# Patient Record
Sex: Female | Born: 1965 | Race: Black or African American | Hispanic: No | Marital: Single | Smoking: Current every day smoker
Health system: Southern US, Community
[De-identification: ages and names within clinical notes are randomized; demographics above are authoritative.]

## PROBLEM LIST (undated history)

## (undated) DIAGNOSIS — Z9889 Other specified postprocedural states: Secondary | ICD-10-CM

## (undated) DIAGNOSIS — L03115 Cellulitis of right lower limb: Secondary | ICD-10-CM

## (undated) DIAGNOSIS — M797 Fibromyalgia: Secondary | ICD-10-CM

## (undated) DIAGNOSIS — I10 Essential (primary) hypertension: Secondary | ICD-10-CM

## (undated) DIAGNOSIS — E11621 Type 2 diabetes mellitus with foot ulcer: Secondary | ICD-10-CM

## (undated) DIAGNOSIS — J45909 Unspecified asthma, uncomplicated: Secondary | ICD-10-CM

## (undated) DIAGNOSIS — E785 Hyperlipidemia, unspecified: Secondary | ICD-10-CM

## (undated) DIAGNOSIS — E119 Type 2 diabetes mellitus without complications: Secondary | ICD-10-CM

## (undated) DIAGNOSIS — B029 Zoster without complications: Secondary | ICD-10-CM

## (undated) HISTORY — PX: OTHER SURGICAL HISTORY: SHX169

## (undated) HISTORY — DX: Cellulitis of right lower limb: L03.115

## (undated) HISTORY — DX: Type 2 diabetes mellitus with foot ulcer: E11.621

---

## 2006-06-21 ENCOUNTER — Other Ambulatory Visit: Payer: Self-pay

## 2006-06-21 ENCOUNTER — Observation Stay: Payer: Self-pay | Admitting: Internal Medicine

## 2006-06-21 ENCOUNTER — Emergency Department: Payer: Self-pay | Admitting: Emergency Medicine

## 2006-09-07 ENCOUNTER — Emergency Department: Payer: Self-pay | Admitting: Emergency Medicine

## 2007-08-06 ENCOUNTER — Ambulatory Visit: Payer: Self-pay | Admitting: Emergency Medicine

## 2007-09-10 ENCOUNTER — Emergency Department: Payer: Self-pay | Admitting: Emergency Medicine

## 2007-10-22 ENCOUNTER — Other Ambulatory Visit: Payer: Self-pay

## 2007-10-22 ENCOUNTER — Ambulatory Visit: Payer: Self-pay | Admitting: Internal Medicine

## 2007-10-22 ENCOUNTER — Observation Stay: Payer: Self-pay | Admitting: Internal Medicine

## 2009-03-27 ENCOUNTER — Emergency Department: Payer: Self-pay | Admitting: Internal Medicine

## 2009-09-22 ENCOUNTER — Emergency Department: Payer: Self-pay | Admitting: Emergency Medicine

## 2010-05-20 ENCOUNTER — Emergency Department: Payer: Self-pay | Admitting: Emergency Medicine

## 2010-09-07 ENCOUNTER — Ambulatory Visit: Payer: Self-pay | Admitting: Internal Medicine

## 2014-07-08 ENCOUNTER — Emergency Department: Payer: Self-pay | Admitting: Emergency Medicine

## 2014-07-08 LAB — RAPID INFLUENZA A&B ANTIGENS (ARMC ONLY)

## 2015-11-06 ENCOUNTER — Encounter: Payer: Self-pay | Admitting: Emergency Medicine

## 2015-11-06 ENCOUNTER — Emergency Department: Payer: Medicaid Other

## 2015-11-06 ENCOUNTER — Emergency Department
Admission: EM | Admit: 2015-11-06 | Discharge: 2015-11-06 | Disposition: A | Discharge status: Home / Self Care | Payer: Medicaid Other | Attending: Emergency Medicine | Admitting: Emergency Medicine

## 2015-11-06 DIAGNOSIS — Z79899 Other long term (current) drug therapy: Secondary | ICD-10-CM | POA: Diagnosis not present

## 2015-11-06 DIAGNOSIS — I10 Essential (primary) hypertension: Secondary | ICD-10-CM | POA: Diagnosis not present

## 2015-11-06 DIAGNOSIS — Z7982 Long term (current) use of aspirin: Secondary | ICD-10-CM | POA: Insufficient documentation

## 2015-11-06 DIAGNOSIS — R0789 Other chest pain: Secondary | ICD-10-CM

## 2015-11-06 DIAGNOSIS — F419 Anxiety disorder, unspecified: Secondary | ICD-10-CM | POA: Diagnosis not present

## 2015-11-06 DIAGNOSIS — E1149 Type 2 diabetes mellitus with other diabetic neurological complication: Secondary | ICD-10-CM | POA: Diagnosis not present

## 2015-11-06 DIAGNOSIS — E11628 Type 2 diabetes mellitus with other skin complications: Secondary | ICD-10-CM | POA: Insufficient documentation

## 2015-11-06 DIAGNOSIS — Z794 Long term (current) use of insulin: Secondary | ICD-10-CM | POA: Diagnosis not present

## 2015-11-06 DIAGNOSIS — R079 Chest pain, unspecified: Secondary | ICD-10-CM | POA: Diagnosis present

## 2015-11-06 HISTORY — DX: Other specified postprocedural states: Z98.890

## 2015-11-06 HISTORY — DX: Unspecified asthma, uncomplicated: J45.909

## 2015-11-06 HISTORY — DX: Type 2 diabetes mellitus without complications: E11.9

## 2015-11-06 HISTORY — DX: Essential (primary) hypertension: I10

## 2015-11-06 HISTORY — DX: Fibromyalgia: M79.7

## 2015-11-06 LAB — GLUCOSE, CAPILLARY: Glucose-Capillary: 231 mg/dL — ABNORMAL HIGH (ref 65–99)

## 2015-11-06 MED ORDER — GABAPENTIN 400 MG PO CAPS
800.0000 mg | ORAL_CAPSULE | Freq: Once | ORAL | Status: AC
  Administered 2015-11-06: 800 mg via ORAL
  Filled 2015-11-06: qty 2

## 2015-11-06 MED ORDER — AMITRIPTYLINE HCL 25 MG PO TABS
50.0000 mg | ORAL_TABLET | ORAL | Status: AC
  Administered 2015-11-06: 50 mg via ORAL
  Filled 2015-11-06: qty 2

## 2015-11-06 NOTE — Discharge Instructions (Signed)
Chest Wall Pain Chest wall pain is pain in or around the bones and muscles of your chest. Sometimes, an injury causes this pain. Sometimes, the cause may not be known. This pain may take several weeks or longer to get better. HOME CARE INSTRUCTIONS  Pay attention to any changes in your symptoms. Take these actions to help with your pain:   Rest as told by your health care provider.   Avoid activities that cause pain. These include any activities that use your chest muscles or your abdominal and side muscles to lift heavy items.   If directed, apply ice to the painful area:  Put ice in a plastic bag.  Place a towel between your skin and the bag.  Leave the ice on for 20 minutes, 2-3 times per day.  Take over-the-counter and prescription medicines only as told by your health care provider.  Do not use tobacco products, including cigarettes, chewing tobacco, and e-cigarettes. If you need help quitting, ask your health care provider.  Keep all follow-up visits as told by your health care provider. This is important. SEEK MEDICAL CARE IF:  You have a fever.  Your chest pain becomes worse.  You have new symptoms. SEEK IMMEDIATE MEDICAL CARE IF:  You have nausea or vomiting.  You feel sweaty or light-headed.  You have a cough with phlegm (sputum) or you cough up blood.  You develop shortness of breath.   This information is not intended to replace advice given to you by your health care provider. Make sure you discuss any questions you have with your health care provider.   Document Released: 09/16/2005 Document Revised: 06/07/2015 Document Reviewed: 12/12/2014 Elsevier Interactive Patient Education 2016 Elsevier Inc[employer]  Panic Attacks Panic attacks are sudden, short feelings of great fear or discomfort. You may have them for no reason when you are relaxed, when you are uneasy (anxious), or when you are sleeping.  HOME CARE  Take all your medicines as told.  Check with  your doctor before starting new medicines.  Keep all doctor visits. GET HELP IF:  You are not able to take your medicines as told.  Your symptoms do not get better.  Your symptoms get worse. GET HELP RIGHT AWAY IF:  Your attacks seem different than your normal attacks.  You have thoughts about hurting yourself or others.  You take panic attack medicine and you have a side effect. MAKE SURE YOU:  Understand these instructions.  Will watch your condition.  Will get help right away if you are not doing well or get worse.   This information is not intended to replace advice given to you by your health care provider. Make sure you discuss any questions you have with your health care provider.   Document Released: 10/19/2010 Document Revised: 07/07/2013 Document Reviewed: 04/30/2013 Elsevier Interactive Patient Education 2016 Else[employer] Inc.  Nonspecific Chest Pain  Chest pain can be caused by many different conditions. There is always a chance that your pain could be related to something serious, such as a heart attack or a blood clot in your lungs. Chest pain can also be caused by conditions that are not life-threatening. If you have chest pain, it is very important to follow up with your health care provider. CAUSES  Chest pain can be caused by:  Heartburn.  Pneumonia or bronchitis.  Anxiety or stress.  Inflammation around your heart (pericarditis) or lung (pleuritis or pleurisy).  A blood clot in your lung.  A collapsed lung (pneumothorax).  It can develop suddenly on its own (spontaneous pneumothorax) or from trauma to the chest.  Shingles infection (varicella-zoster virus).  Heart attack.  Damage to the bones, muscles, and cartilage that make up your chest wall. This can include:  Bruised bones due to injury.  Strained muscles or cartilage due to frequent or repeated coughing or overwork.  Fracture to one or more ribs.  Sore cartilage due to inflammation  (costochondritis). RISK FACTORS  Risk factors for chest pain may include:  Activities that increase your risk for trauma or injury to your chest.  Respiratory infections or conditions that cause frequent coughing.  Medical conditions or overeating that can cause heartburn.  Heart disease or family history of heart disease.  Conditions or health behaviors that increase your risk of developing a blood clot.  Having had chicken pox (varicella zoster). SIGNS AND SYMPTOMS Chest pain can feel like:  Burning or tingling on the surface of your chest or deep in your chest.  Crushing, pressure, aching, or squeezing pain.  Dull or sharp pain that is worse when you move, cough, or take a deep breath.  Pain that is also felt in your back, neck, shoulder, or arm, or pain that spreads to any of these areas. Your chest pain may come and go, or it may stay constant. DIAGNOSIS Lab tests or other studies may be needed to find the cause of your pain. Your health care provider may have you take a test called an ambulatory ECG (electrocardiogram). An ECG records your heartbeat patterns at the time the test is performed. You may also have other tests, such as:  Transthoracic echocardiogram (TTE). During echocardiography, sound waves are used to create a picture of all of the heart structures and to look at how blood flows through your heart.  Transesophageal echocardiogram (TEE).This is a more advanced imaging test that obtains images from inside your body. It allows your health care provider to see your heart in finer detail.  Cardiac monitoring. This allows your health care provider to monitor your heart rate and rhythm in real time.  Holter monitor. This is a portable device that records your heartbeat and can help to diagnose abnormal heartbeats. It allows your health care provider to track your heart activity for several days, if needed.  Stress tests. These can be done through exercise or by  taking medicine that makes your heart beat more quickly.  Blood tests.  Imaging tests. TREATMENT  Your treatment depends on what is causing your chest pain. Treatment may include:  Medicines. These may include:  Acid blockers for heartburn.  Anti-inflammatory medicine.  Pain medicine for inflammatory conditions.  Antibiotic medicine, if an infection is present.  Medicines to dissolve blood clots.  Medicines to treat coronary artery disease.  Supportive care for conditions that do not require medicines. This may include:  Resting.  Applying heat or cold packs to injured areas.  Limiting activities until pain decreases. HOME CARE INSTRUCTIONS  If you were prescribed an antibiotic medicine, finish it all even if you start to feel better.  Avoid any activities that bring on chest pain.  Do not use any tobacco products, including cigarettes, chewing tobacco, or electronic cigarettes. If you need help quitting, ask your health care provider.  Do not drink alcohol.  Take medicines only as directed by your health care provider.  Keep all follow-up visits as directed by your health care provider. This is important. This includes any further testing if your chest pain does not  go away.  If heartburn is the cause for your chest pain, you may be told to keep your head raised (elevated) while sleeping. This reduces the chance that acid will go from your stomach into your esophagus.  Make lifestyle changes as directed by your health care provider. These may include:  Getting regular exercise. Ask your health care provider to suggest some activities that are safe for you.  Eating a heart-healthy diet. A registered dietitian can help you to learn healthy eating options.  Maintaining a healthy weight.  Managing diabetes, if necessary.  Reducing stress. SEEK MEDICAL CARE IF:  Your chest pain does not go away after treatment.  You have a rash with blisters on your  chest.  You have a fever. SEEK IMMEDIATE MEDICAL CARE IF:   Your chest pain is worse.  You have an increasing cough, or you cough up blood.  You have severe abdominal pain.  You have severe weakness.  You faint.  You have chills.  You have sudden, unexplained chest discomfort.  You have sudden, unexplained discomfort in your arms, back, neck, or jaw.  You have shortness of breath at any time.  You suddenly start to sweat, or your skin gets clammy.  You feel nauseous or you vomit.  You suddenly feel light-headed or dizzy.  Your heart begins to beat quickly, or it feels like it is skipping beats. These symptoms may represent a serious problem that is an emergency. Do not wait to see if the symptoms will go away. Get medical help right away. Call your local emergency services (911 in the [place] Do not drive yourself to the hospital.   This information is not intended to replace advice given to you by your health care provider. Make sure you discuss any questions you have with your health care provider.   Document Released: 06/26/2005 Document Revised: 10/07/2014 Document Reviewed: 04/22/2014 Elsevier Interactive Patient Education [employer]
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
## 2016-01-11 ENCOUNTER — Emergency Department: Payer: Medicaid Other

## 2016-01-11 ENCOUNTER — Inpatient Hospital Stay
Admission: EM | Admit: 2016-01-11 | Discharge: 2016-01-14 | DRG: 041 | Disposition: A | Discharge status: Home / Self Care | Payer: Medicaid Other | Attending: Internal Medicine | Admitting: Internal Medicine

## 2016-01-11 ENCOUNTER — Encounter: Payer: Self-pay | Admitting: Emergency Medicine

## 2016-01-11 DIAGNOSIS — W228XXA Striking against or struck by other objects, initial encounter: Secondary | ICD-10-CM

## 2016-01-11 DIAGNOSIS — E1165 Type 2 diabetes mellitus with hyperglycemia: Secondary | ICD-10-CM | POA: Diagnosis present

## 2016-01-11 DIAGNOSIS — A419 Sepsis, unspecified organism: Secondary | ICD-10-CM | POA: Diagnosis present

## 2016-01-11 DIAGNOSIS — Z888 Allergy status to other drugs, medicaments and biological substances status: Secondary | ICD-10-CM

## 2016-01-11 DIAGNOSIS — F17200 Nicotine dependence, unspecified, uncomplicated: Secondary | ICD-10-CM | POA: Diagnosis present

## 2016-01-11 DIAGNOSIS — Z23 Encounter for immunization: Secondary | ICD-10-CM

## 2016-01-11 DIAGNOSIS — M797 Fibromyalgia: Secondary | ICD-10-CM | POA: Diagnosis present

## 2016-01-11 DIAGNOSIS — E11621 Type 2 diabetes mellitus with foot ulcer: Secondary | ICD-10-CM | POA: Diagnosis present

## 2016-01-11 DIAGNOSIS — L03115 Cellulitis of right lower limb: Secondary | ICD-10-CM | POA: Diagnosis present

## 2016-01-11 DIAGNOSIS — L02611 Cutaneous abscess of right foot: Secondary | ICD-10-CM | POA: Diagnosis present

## 2016-01-11 DIAGNOSIS — L97419 Non-pressure chronic ulcer of right heel and midfoot with unspecified severity: Secondary | ICD-10-CM | POA: Diagnosis present

## 2016-01-11 DIAGNOSIS — J45909 Unspecified asthma, uncomplicated: Secondary | ICD-10-CM | POA: Diagnosis present

## 2016-01-11 DIAGNOSIS — S91331A Puncture wound without foreign body, right foot, initial encounter: Secondary | ICD-10-CM | POA: Diagnosis present

## 2016-01-11 DIAGNOSIS — E7800 Pure hypercholesterolemia, unspecified: Secondary | ICD-10-CM | POA: Diagnosis present

## 2016-01-11 DIAGNOSIS — Z794 Long term (current) use of insulin: Secondary | ICD-10-CM

## 2016-01-11 DIAGNOSIS — Y9301 Activity, walking, marching and hiking: Secondary | ICD-10-CM | POA: Diagnosis present

## 2016-01-11 DIAGNOSIS — E11610 Type 2 diabetes mellitus with diabetic neuropathic arthropathy: Principal | ICD-10-CM | POA: Diagnosis present

## 2016-01-11 DIAGNOSIS — R21 Rash and other nonspecific skin eruption: Secondary | ICD-10-CM | POA: Diagnosis present

## 2016-01-11 DIAGNOSIS — Z7982 Long term (current) use of aspirin: Secondary | ICD-10-CM

## 2016-01-11 DIAGNOSIS — E871 Hypo-osmolality and hyponatremia: Secondary | ICD-10-CM | POA: Diagnosis present

## 2016-01-11 DIAGNOSIS — I10 Essential (primary) hypertension: Secondary | ICD-10-CM | POA: Diagnosis present

## 2016-01-11 DIAGNOSIS — Z89422 Acquired absence of other left toe(s): Secondary | ICD-10-CM

## 2016-01-11 LAB — COMPREHENSIVE METABOLIC PANEL
ALBUMIN: 3.7 g/dL (ref 3.5–5.0)
ALK PHOS: 73 U/L (ref 38–126)
ALT: 15 U/L (ref 14–54)
ANION GAP: 9 (ref 5–15)
AST: 13 U/L — AB (ref 15–41)
BILIRUBIN TOTAL: 1 mg/dL (ref 0.3–1.2)
BUN: 13 mg/dL (ref 6–20)
CALCIUM: 8.7 mg/dL — AB (ref 8.9–10.3)
CHLORIDE: 100 mmol/L — AB (ref 101–111)
CO2: 22 mmol/L (ref 22–32)
CREATININE: 0.86 mg/dL (ref 0.44–1.00)
GFR calc Af Amer: >60 mL/min (ref >60)
GFR calc non Af Amer: >60 mL/min (ref >60)
GLUCOSE: 344 mg/dL — AB (ref 65–99)
Potassium: 3.9 mmol/L (ref 3.5–5.1)
SODIUM: 131 mmol/L — AB (ref 135–145)
Total Protein: 6.9 g/dL (ref 6.5–8.1)

## 2016-01-11 LAB — URINALYSIS COMPLETE WITH MICROSCOPIC (ARMC ONLY)
Bilirubin Urine: NEGATIVE
Glucose, UA: >500 mg/dL — AB
Hgb urine dipstick: NEGATIVE
KETONES UR: NEGATIVE mg/dL
Nitrite: NEGATIVE
PH: 6 (ref 5.0–8.0)
PROTEIN: NEGATIVE mg/dL
Specific Gravity, Urine: 1.015 (ref 1.005–1.030)

## 2016-01-11 LAB — CBC WITH DIFFERENTIAL/PLATELET
BASOS ABS: 0.1 10*3/uL (ref 0–0.1)
Basophils Relative: 0 %
EOS PCT: 0 %
Eosinophils Absolute: 0 10*3/uL (ref 0–0.7)
HEMATOCRIT: 33.5 % — AB (ref 35.0–47.0)
Hemoglobin: 11.2 g/dL — ABNORMAL LOW (ref 12.0–16.0)
LYMPHS ABS: 2 10*3/uL (ref 1.0–3.6)
LYMPHS PCT: 9 %
MCH: 27.7 pg (ref 26.0–34.0)
MCHC: 33.6 g/dL (ref 32.0–36.0)
MCV: 82.4 fL (ref 80.0–100.0)
Monocytes Absolute: 1.7 10*3/uL — ABNORMAL HIGH (ref 0.2–0.9)
Monocytes Relative: 8 %
NEUTROS ABS: 17.3 10*3/uL — AB (ref 1.4–6.5)
Neutrophils Relative %: 83 %
PLATELETS: 228 10*3/uL (ref 150–440)
RBC: 4.06 MIL/uL (ref 3.80–5.20)
RDW: 14 % (ref 11.5–14.5)
WBC: 21.1 10*3/uL — AB (ref 3.6–11.0)

## 2016-01-11 LAB — LACTIC ACID, PLASMA
Lactic Acid, Venous: 1.7 mmol/L (ref 0.5–2.0)
Lactic Acid, Venous: 1.3 mmol/L (ref 0.5–2.0)

## 2016-01-11 MED ORDER — SODIUM CHLORIDE 0.9 % IV BOLUS (SEPSIS)
1000.0000 mL | Freq: Once | INTRAVENOUS | Status: AC
Start: 2016-01-11 — End: 2016-01-11
  Administered 2016-01-11: 1000 mL via INTRAVENOUS

## 2016-01-11 MED ORDER — SODIUM CHLORIDE 0.9 % IV BOLUS (SEPSIS)
1000.0000 mL | Freq: Once | INTRAVENOUS | Status: AC
  Administered 2016-01-11: 1000 mL via INTRAVENOUS

## 2016-01-11 MED ORDER — VANCOMYCIN HCL IN DEXTROSE 1-5 GM/200ML-% IV SOLN: 1000.0000 mg | INTRAVENOUS | Status: DC

## 2016-01-11 MED ORDER — PIPERACILLIN-TAZOBACTAM 3.375 G IVPB 30 MIN
3.3750 g | Freq: Once | INTRAVENOUS | Status: AC
  Administered 2016-01-11: 3.375 g via INTRAVENOUS
  Filled 2016-01-11: qty 50

## 2016-01-11 MED ORDER — VANCOMYCIN HCL IN DEXTROSE 1-5 GM/200ML-% IV SOLN
1000.0000 mg | Freq: Once | INTRAVENOUS | Status: AC
  Administered 2016-01-11: 1000 mg via INTRAVENOUS
  Filled 2016-01-11: qty 200

## 2016-01-11 MED ORDER — TETANUS-DIPHTH-ACELL PERTUSSIS 5-2.5-18.5 LF-MCG/0.5 IM SUSP
0.5000 mL | Freq: Once | INTRAMUSCULAR | Status: AC
  Administered 2016-01-11: 0.5 mL via INTRAMUSCULAR
  Filled 2016-01-11: qty 0.5

## 2016-01-11 MED ORDER — PIPERACILLIN-TAZOBACTAM 3.375 G IVPB 30 MIN
3.3750 g | Freq: Three times a day (TID) | INTRAVENOUS | Status: DC
  Filled 2016-01-11 (×2): qty 50

## 2016-01-11 NOTE — ED Provider Notes (Signed)
[facility] Emergency Department Provider Note    ____________________________________________  Time seen: ~1810  I have reviewed the triage vital signs and the nursing notes.   HISTORY  Chief Complaint Foot Pain   History limited by: Not Limited   HPI Jackie Miles is a 50 y.o. female with history of diabetes who presents to the emergency department today because of concerns for right foot swelling and pain. The patient states she was walking outside without shoes on when she had a puncture wound to her foot. She is not sure what she stepped on. She states that when she took her shoe off she noticed blood around the sock of her shoe. The patient states that since that time she has had some pain in that area. She has noticed increased swelling. She has not noticed any discharge however states that it's hard for her to tell. She denies any fevers nausea or vomiting.   Past Medical History  Diagnosis Date  . Fibromyalgia   . Diabetes mellitus without complication (HCC)   . Hypertension   . Asthma   . H/O percutaneous left heart catheterization     There are no active problems to display for this patient.   History reviewed. No pertinent past surgical history.  No current outpatient prescriptions on file.  Allergies Ibuprofen  No family history on file.  Social History Social History  Substance Use Topics  . Smoking status: Current Every Day Smoker  . Smokeless tobacco: None  . Alcohol Use: None    Review of Systems Constitutional: Negative for fever. Cardiovascular: Negative for chest pain. Respiratory: Negative for shortness of breath. Gastrointestinal: Negative for abdominal pain, vomiting and diarrhea. Neurological: Negative for headaches, focal weakness or numbness.  10-point ROS otherwise negative.  ____________________________________________   PHYSICAL EXAM:  VITAL SIGNS: ED Triage Vitals  Enc Vitals Group     BP  01/11/16 1629 115/59 mmHg     Pulse Rate 01/11/16 1629 116     Resp 01/11/16 1629 20     Temp 01/11/16 1629 100.3 F (37.9 C)     Temp Source 01/11/16 1629 Oral     SpO2 01/11/16 1629 98 %     Weight 01/11/16 1629 220 lb (99.791 kg)     Height 01/11/16 1629 5\' 8"  (1.727 m)     Head Cir --      Peak Flow --      Pain Score 01/11/16 1629 10   Constitutional: Alert and oriented. Well appearing and in no distress. Eyes: Conjunctivae are normal. PERRL. Normal extraocular movements. ENT   Head: Normocephalic and atraumatic.   Nose: No congestion/rhinnorhea.   Mouth/Throat: Mucous membranes are moist.   Neck: No stridor. Hematological/Lymphatic/Immunilogical: No cervical lymphadenopathy. Cardiovascular: Tachycardic, regular rhythm.  No murmurs, rubs, or gallops. Respiratory: Normal respiratory effort without tachypnea nor retractions. Breath sounds are clear and equal bilaterally. No wheezes/rales/rhonchi. Gastrointestinal: Soft and nontender. No distention.  Genitourinary: Deferred Musculoskeletal: Right foot with swelling and tenderness and erythema to the sole. Small puncture type wound noted. No drainage elicited. Neurologic:  Normal speech and language. No gross focal neurologic deficits are appreciated.  Skin:  Skin is warm, dry and intact. No rash noted. Psychiatric: Mood and affect are normal. Speech and behavior are normal. Patient exhibits appropriate insight and judgment.  ____________________________________________    LABS (pertinent positives/negatives)  Labs Reviewed  COMPREHENSIVE METABOLIC PANEL - Abnormal; Notable for the following:    Sodium 131 (*)    Chloride  100 (*)    Glucose, Bld 344 (*)    Calcium 8.7 (*)    AST 13 (*)    All other components within normal limits  CBC WITH DIFFERENTIAL/PLATELET - Abnormal; Notable for the following:    WBC 21.1 (*)    Hemoglobin 11.2 (*)    HCT 33.5 (*)    Neutro Abs 17.3 (*)    Monocytes Absolute 1.7 (*)     All other components within normal limits  URINALYSIS COMPLETEWITH MICROSCOPIC ([facility] ONLY) - Abnormal; Notable for the following:    Color, Urine YELLOW (*)    APPearance HAZY (*)    Glucose, UA >500 (*)    Leukocytes, UA 1+ (*)    Bacteria, UA RARE (*)    Squamous Epithelial / LPF 6-30 (*)    All other components within normal limits  CULTURE, BLOOD (ROUTINE X 2)  CULTURE, BLOOD (ROUTINE X 2)  URINE CULTURE  LACTIC ACID, PLASMA  LACTIC ACID, PLASMA     ____________________________________________   EKG  None  ____________________________________________    RADIOLOGY  CXR IMPRESSION: No edema or consolidation.  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 These fractures are age uncertain with several areas appearing well corticated. All of this abnormality may be chronic. Given the extent of abnormality, correlation with the CT of the foot without contrast is advised to better assess the anatomy in multiple planes. There is pes planus.  No radiopaque foreign body identified. No soft tissue abscess appreciable.  ____________________________________________   PROCEDURES  Procedure(s) performed: None  Critical Care performed: No  ____________________________________________   INITIAL IMPRESSION / ASSESSMENT AND PLAN / ED COURSE  Pertinent labs & imaging results that were available during my care of the patient were reviewed by me and considered in my medical decision making (see chart for details).  Patient presented to the emergency department today with right foot pain and swelling after having a puncture wound yesterday. On exam patient's right foot is slightly swollen and tender. Some mild erythema around the site of the puncture wound. She was tachycardic and febrile initially. Leukocytosis greater than 20. Given this and the patient's history of  diabetes will plan on admission for further IV antibiotics. Will update tetanus  ____________________________________________   FINAL CLINICAL IMPRESSION(S) / ED DIAGNOSES  Final diagnoses:  Cellulitis of right lower extremity     Phineas Semen, MD 01/11/16 2340

## 2016-01-11 NOTE — H&P (Addendum)
[facility] [place] [facility]   PATIENT NAME: Jackie Miles Kapaun    MR#:  [id number]  DATE OF BIRTH:  12-10-1965  DATE OF ADMISSION:  01/11/2016  PRIMARY CARE PHYSICIAN: [facility]   REQUESTING/REFERRING PHYSICIAN: Dr. Derrill KayGoodman  CHIEF COMPLAINT:  Right foot injury and pain one day  HISTORY OF PRESENT ILLNESS:  Jackie Miles Senters  is a 50 y.o. female with a known history of type 2 diabetes, history of high cholesterol and is status post left fifth toe amputation, right Charcot arthropathy, left plantar diabetic toe ulcer chronic who [facility] and history of ongoing tobacco abuse comes to the emergency room after she started having pain with the right foot. Patient accidentally stepped on the water sprinkler which was on the floor that she did not notice she started having bleeding from her right foot. Today she started having fever of 101 100.3. In the emergency room she was tachycardic with heart rate in the 90 to 120s. She was also found to have elevated white count of 21,000. Patient is being admitted with sepsis secondary to right foot injury/mild cellulitis. Her x-ray of the foot is negative for any foreign object. She received IV vancomycin and Zosyn in the emergency room.  PAST MEDICAL HISTORY:   Past Medical History  Diagnosis Date  . Fibromyalgia   . Diabetes mellitus without complication (HCC)   . Hypertension   . Asthma   . H/O percutaneous left heart catheterization     PAST SURGICAL HISTOIRY:  History reviewed. No pertinent past surgical history.  SOCIAL HISTORY:   Social History  Substance Use Topics  . Smoking status: Current Every Day Smoker  . Smokeless tobacco: Not on file  . Alcohol Use: Not on file    FAMILY HISTORY:  No family history on file.  DRUG ALLERGIES:   Allergies  Allergen Reactions  . Ibuprofen Other (See Comments)    Acid reflux     REVIEW OF SYSTEMS:  Review of Systems   Constitutional: Positive for fever and malaise/fatigue. Negative for chills and weight loss.  HENT: Negative for ear discharge, ear pain and nosebleeds.   Eyes: Negative for blurred vision, pain and discharge.  Respiratory: Negative for sputum production, shortness of breath, wheezing and stridor.   Cardiovascular: Negative for chest pain, palpitations, orthopnea and PND.  Gastrointestinal: Negative for nausea, vomiting, abdominal pain and diarrhea.  Genitourinary: Negative for urgency and frequency.  Musculoskeletal: Positive for joint pain. Negative for back pain.  Neurological: Positive for weakness. Negative for sensory change, speech change and focal weakness.  Psychiatric/Behavioral: Negative for depression and hallucinations. The patient is not nervous/anxious.   All other systems reviewed and are negative.    MEDICATIONS AT HOME:   Prior to Admission medications   Medication Sig Start Date End Date Taking? Authorizing Provider  acetaminophen (TYLENOL) 325 MG tablet Take 650 mg by mouth every 4 (four) hours as needed.   Yes Historical Provider, MD  albuterol (PROAIR HFA) 108 (90 Base) MCG/ACT inhaler Inhale 1 puff into the lungs every 6 (six) hours as needed.   Yes Historical Provider, MD  amitriptyline (ELAVIL) 100 MG tablet Take 100 mg by mouth at bedtime.   Yes Historical Provider, MD  aspirin EC 81 MG tablet Take 81 mg by mouth daily.   Yes Historical Provider, MD  atorvastatin (LIPITOR) 20 MG tablet Take 20 mg by mouth daily.   Yes Historical Provider, MD  cetirizine (ZYRTEC) 10 MG tablet  Take 10 mg by mouth daily.   Yes Historical Provider, MD  Cyanocobalamin (RA VITAMIN B-12 TR) 1000 MCG TBCR Take 1,000 mcg by mouth daily.   Yes Historical Provider, MD  fluticasone (FLONASE) 50 MCG/ACT nasal spray Place 2 sprays into both nostrils 2 (two) times daily.   Yes Historical Provider, MD  gabapentin (NEURONTIN) 800 MG tablet Take 800-1,600 mg by mouth 2 (two) times daily. Take   in morning and take  in evening   Yes Historical Provider, MD  glipiZIDE (GLUCOTROL XL) 10 MG 24 hr tablet Take 20 mg by mouth 2 (two) times daily.    Yes Historical Provider, MD  insulin aspart (NOVOLOG FLEXPEN) 100 UNIT/ML FlexPen Inject 7 Units into the skin 3 (three) times daily with meals.   Yes Historical Provider, MD  insulin glargine (LANTUS) 100 UNIT/ML injection Inject 20 Units into the skin at bedtime.   Yes Historical Provider, MD  losartan (COZAAR) 25 MG tablet Take 25 mg by mouth daily.   Yes Historical Provider, MD  metFORMIN (GLUCOPHAGE) 1000 MG tablet Take 1,000 mg by mouth 2 (two) times daily.   Yes Historical Provider, MD      VITAL SIGNS:  Blood pressure 148/63, pulse 120, temperature 100.3 F (37.9 C), temperature source Oral, resp. rate 20, height  (1.727 m), weight 91.989 kg (202 lb 12.8 oz), SpO2 96 %.  PHYSICAL EXAMINATION:  GENERAL:  50 y.o.-year-old patient lying in the bed with no acute distress.  EYES: Pupils equal, round, reactive to light and accommodation. No scleral icterus. Extraocular muscles intact.  HEENT: Head atraumatic, normocephalic. Oropharynx and nasopharynx clear.  NECK:  Supple, no jugular venous distention. No thyroid enlargement, no tenderness.  LUNGS: Normal breath sounds bilaterally, no wheezing, rales,rhonchi or crepitation. No use of accessory muscles of respiration.  CARDIOVASCULAR: S1, S2 normal. No murmurs, rubs, or gallops. Tachycardia ABDOMEN: Soft, nontender, nondistended. Bowel sounds present. No organomegaly or mass.  EXTREMITIES: No pedal edema, cyanosis, or clubbing. Chronic right foot Charcot's arthropathy. Patient has a small puncture wound over the right foot without any pus. Mild cellulitis. Tenderness present. No bleeding. NEUROLOGIC: Cranial nerves II through XII are intact. Muscle strength 5/5 in all extremities. Sensation intact. Gait not checked. Decreased sensation both lower extremities. Patient has chronic  changes of infection in the left foot. PSYCHIATRIC: The patient is alert and oriented x 3.  SKIN: No obvious rash, lesion, or ulcer.   LABORATORY PANEL:   CBC  Recent Labs Lab 01/11/16 1645  WBC 21.1*  HGB 11.2*  HCT 33.5*  PLT 228   ------------------------------------------------------------------------------------------------------------------  Chemistries   Recent Labs Lab 01/11/16 1645  NA 131*  K 3.9  CL 100*  CO2 22  GLUCOSE 344*  BUN 13  CREATININE 0.86  CALCIUM 8.7*  AST 13*  ALT 15  ALKPHOS 73  BILITOT 1.0   ------------------------------------------------------------------------------------------------------------------  Cardiac Enzymes No results for input(s): TROPONINI in the last 168 hours. ------------------------------------------------------------------------------------------------------------------  RADIOLOGY:  Dg Chest 2 View  01/11/2016  CLINICAL DATA:  Fever EXAM: CHEST  2 VIEW COMPARISON:  November 06, 2015 FINDINGS: Lungs are clear. Heart size and pulmonary vascularity are normal. No adenopathy. No bone lesions. IMPRESSION: No edema or consolidation. Electronically Signed   By: Bretta Bang III M.D.   On: 01/11/2016 17:33   Dg Foot Complete Right  01/11/2016  CLINICAL DATA:  Puncture wound bottom of foot 1 day prior. Report of prior fractures EXAM: RIGHT FOOT COMPLETE - 3+ VIEW COMPARISON:  None.  FINDINGS: There is no radiopaque foreign body. There is evidence of age uncertain midfoot fractures with separation between the first and second metatarsals consistent with a divergent Lisfranc type injury. There is bony disruption in the distal tarsal row, consistent with age uncertain fractures throughout this area. There is a bony fragment located between the second proximal and more distal aspects of the metatarsal. This area appears well corticated. There is evidence of an old fracture along the medial proximal medial cuneiform bone. There is  pes planus. There is mild narrowing of the first MTP joint.  No erosive changes. 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 These fractures are age uncertain with several areas appearing well corticated. All of this abnormality may be chronic. Given the extent of abnormality, correlation with the CT of the foot without contrast is advised to better assess the anatomy in multiple planes. There is pes planus. No radiopaque foreign body identified. No soft tissue abscess appreciable. Electronically Signed   By: Bretta Bang III M.D.   On: 01/11/2016 17:38    EKG:  Sinus tachycardia  IMPRESSION AND PLAN:  Lenette Rau  is a 50 y.o. female with a known history of type 2 diabetes, history of high cholesterol and is status post left fifth toe amputation, right Charcot arthropathy, left plantar diabetic toe ulcer chronic who follows at [facility] wound clinic and history of ongoing tobacco abuse comes to the emergency room after she started having pain with the right foot. Patient accidentally stepped on the water sprinkler which was on the floor that she did not notice she started having bleeding from her right foot  1. Sepsis Patient presented with fever, white count elevated of 20,000, tachycardia, right foot mild cellulitis/injury Admitted to medical floor IV vancomycin and Zosyn. Follow blood cultures. De-escalate antibiotics. Podiatry consultation. IV fluids  2. Leukocytosis secondary to #1 Monitor WBC count  3. Tachycardia due to #1  4. Type 2 diabetes-uncontrolled Continue home meds including sliding scale and Lantus  5. Hypertension continue home meds  6. DVT prophylaxis subcutaneous Lovenox  7. Tobacco abuse patient advised on smoking cessation about 3 minutes spent. Does not appear to be motivated.   All the records are reviewed and case discussed with ED provider. Management plans discussed  with the patient and  in agreement.  CODE STATUS: Full  TOTAL TIME TAKING CARE OF THIS PATIENT: 45 minutes.    Isyss Espinal M.D on 01/11/2016 at 9:44 PM  Between 7am to 6pm - Pager - [phone]  After 6pm go to [email or web address] - password [email or web address] [facility]  [place] [facility]  Office  [phone]  CC: Primary care physician; [facility]

## 2016-01-11 NOTE — ED Notes (Signed)
Puncture wound in the bottom of right foot , x1 day ago , currently bleeding controled , pt with deformity to same foot x2 years. Pt with fever on arrival 100.3

## 2016-01-12 DIAGNOSIS — L97419 Non-pressure chronic ulcer of right heel and midfoot with unspecified severity: Secondary | ICD-10-CM | POA: Diagnosis present

## 2016-01-12 DIAGNOSIS — M79671 Pain in right foot: Secondary | ICD-10-CM | POA: Diagnosis present

## 2016-01-12 DIAGNOSIS — S91331A Puncture wound without foreign body, right foot, initial encounter: Secondary | ICD-10-CM | POA: Diagnosis present

## 2016-01-12 DIAGNOSIS — L03115 Cellulitis of right lower limb: Secondary | ICD-10-CM | POA: Diagnosis present

## 2016-01-12 DIAGNOSIS — F17200 Nicotine dependence, unspecified, uncomplicated: Secondary | ICD-10-CM | POA: Diagnosis present

## 2016-01-12 DIAGNOSIS — L02611 Cutaneous abscess of right foot: Secondary | ICD-10-CM | POA: Diagnosis present

## 2016-01-12 DIAGNOSIS — Z794 Long term (current) use of insulin: Secondary | ICD-10-CM | POA: Diagnosis not present

## 2016-01-12 DIAGNOSIS — E11610 Type 2 diabetes mellitus with diabetic neuropathic arthropathy: Secondary | ICD-10-CM | POA: Diagnosis present

## 2016-01-12 DIAGNOSIS — E7800 Pure hypercholesterolemia, unspecified: Secondary | ICD-10-CM | POA: Diagnosis present

## 2016-01-12 DIAGNOSIS — Y9301 Activity, walking, marching and hiking: Secondary | ICD-10-CM | POA: Diagnosis present

## 2016-01-12 DIAGNOSIS — W228XXA Striking against or struck by other objects, initial encounter: Secondary | ICD-10-CM | POA: Diagnosis not present

## 2016-01-12 DIAGNOSIS — Z89422 Acquired absence of other left toe(s): Secondary | ICD-10-CM | POA: Diagnosis not present

## 2016-01-12 DIAGNOSIS — Z7982 Long term (current) use of aspirin: Secondary | ICD-10-CM | POA: Diagnosis not present

## 2016-01-12 DIAGNOSIS — I10 Essential (primary) hypertension: Secondary | ICD-10-CM | POA: Diagnosis present

## 2016-01-12 DIAGNOSIS — E1165 Type 2 diabetes mellitus with hyperglycemia: Secondary | ICD-10-CM | POA: Diagnosis present

## 2016-01-12 DIAGNOSIS — J45909 Unspecified asthma, uncomplicated: Secondary | ICD-10-CM | POA: Diagnosis present

## 2016-01-12 DIAGNOSIS — E871 Hypo-osmolality and hyponatremia: Secondary | ICD-10-CM | POA: Diagnosis present

## 2016-01-12 DIAGNOSIS — E11621 Type 2 diabetes mellitus with foot ulcer: Secondary | ICD-10-CM | POA: Diagnosis present

## 2016-01-12 DIAGNOSIS — M797 Fibromyalgia: Secondary | ICD-10-CM | POA: Diagnosis present

## 2016-01-12 DIAGNOSIS — A419 Sepsis, unspecified organism: Secondary | ICD-10-CM | POA: Diagnosis present

## 2016-01-12 DIAGNOSIS — R21 Rash and other nonspecific skin eruption: Secondary | ICD-10-CM | POA: Diagnosis present

## 2016-01-12 DIAGNOSIS — Z888 Allergy status to other drugs, medicaments and biological substances status: Secondary | ICD-10-CM | POA: Diagnosis not present

## 2016-01-12 DIAGNOSIS — Z23 Encounter for immunization: Secondary | ICD-10-CM | POA: Diagnosis not present

## 2016-01-12 LAB — CBC
HEMATOCRIT: 30.9 % — AB (ref 35.0–47.0)
HEMOGLOBIN: 10.5 g/dL — AB (ref 12.0–16.0)
MCH: 28.6 pg (ref 26.0–34.0)
MCHC: 34.1 g/dL (ref 32.0–36.0)
MCV: 84 fL (ref 80.0–100.0)
Platelets: 196 10*3/uL (ref 150–440)
RBC: 3.68 MIL/uL — ABNORMAL LOW (ref 3.80–5.20)
RDW: 14.2 % (ref 11.5–14.5)
WBC: 16.2 10*3/uL — ABNORMAL HIGH (ref 3.6–11.0)

## 2016-01-12 LAB — GLUCOSE, CAPILLARY
GLUCOSE-CAPILLARY: 246 mg/dL — AB (ref 65–99)
GLUCOSE-CAPILLARY: 194 mg/dL — AB (ref 65–99)
GLUCOSE-CAPILLARY: 152 mg/dL — AB (ref 65–99)
Glucose-Capillary: 188 mg/dL — ABNORMAL HIGH (ref 65–99)

## 2016-01-12 LAB — CREATININE, SERUM
Creatinine, Ser: 0.88 mg/dL (ref 0.44–1.00)
GFR calc non Af Amer: >60 mL/min (ref >60)

## 2016-01-12 MED ORDER — VANCOMYCIN HCL 10 G IV SOLR
1250.0000 mg | Freq: Two times a day (BID) | INTRAVENOUS | Status: DC
  Administered 2016-01-12 – 2016-01-14 (×5): 1250 mg via INTRAVENOUS
  Filled 2016-01-12 (×6): qty 1250

## 2016-01-12 MED ORDER — METFORMIN HCL 500 MG PO TABS
1000.0000 mg | ORAL_TABLET | Freq: Two times a day (BID) | ORAL | Status: DC
  Administered 2016-01-12: 1000 mg via ORAL
  Filled 2016-01-12: qty 2

## 2016-01-12 MED ORDER — GLUCERNA SHAKE PO LIQD
237.0000 mL | ORAL | Status: DC
  Administered 2016-01-12 – 2016-01-13 (×2): 237 mL via ORAL

## 2016-01-12 MED ORDER — METFORMIN HCL 500 MG PO TABS: 1000.0000 mg | ORAL_TABLET | Freq: Two times a day (BID) | ORAL | Status: DC

## 2016-01-12 MED ORDER — ATORVASTATIN CALCIUM 20 MG PO TABS
20.0000 mg | ORAL_TABLET | Freq: Every day | ORAL | Status: DC
  Administered 2016-01-12 – 2016-01-13 (×2): 20 mg via ORAL
  Filled 2016-01-12 (×3): qty 1

## 2016-01-12 MED ORDER — AMITRIPTYLINE HCL 25 MG PO TABS
100.0000 mg | ORAL_TABLET | Freq: Every day | ORAL | Status: DC
  Administered 2016-01-12 – 2016-01-14 (×2): 100 mg via ORAL
  Filled 2016-01-12 (×2): qty 4

## 2016-01-12 MED ORDER — GABAPENTIN 800 MG PO TABS: 800.0000 mg | ORAL_TABLET | Freq: Two times a day (BID) | ORAL | Status: DC

## 2016-01-12 MED ORDER — INSULIN GLARGINE 100 UNIT/ML ~~LOC~~ SOLN
20.0000 [IU] | Freq: Every day | SUBCUTANEOUS | Status: DC
  Administered 2016-01-12 – 2016-01-13 (×2): 20 [IU] via SUBCUTANEOUS
  Filled 2016-01-12 (×3): qty 0.2

## 2016-01-12 MED ORDER — FLUTICASONE PROPIONATE 50 MCG/ACT NA SUSP
2.0000 | Freq: Two times a day (BID) | NASAL | Status: DC
Start: 2016-01-12 — End: 2016-01-14
  Administered 2016-01-12 – 2016-01-14 (×4): 2 via NASAL
  Filled 2016-01-12: qty 16

## 2016-01-12 MED ORDER — MORPHINE SULFATE (PF) 2 MG/ML IV SOLN
2.0000 mg | Freq: Once | INTRAVENOUS | Status: AC
  Administered 2016-01-12: 2 mg via INTRAVENOUS
  Filled 2016-01-12: qty 1

## 2016-01-12 MED ORDER — ACETAMINOPHEN 325 MG PO TABS
650.0000 mg | ORAL_TABLET | ORAL | Status: DC | PRN
  Filled 2016-01-12 (×2): qty 2

## 2016-01-12 MED ORDER — ACETAMINOPHEN 325 MG PO TABS
650.0000 mg | ORAL_TABLET | Freq: Four times a day (QID) | ORAL | Status: DC | PRN
Start: 2016-01-12 — End: 2016-01-14
  Administered 2016-01-12 – 2016-01-13 (×2): 650 mg via ORAL

## 2016-01-12 MED ORDER — ONDANSETRON HCL 4 MG/2ML IJ SOLN: 4.0000 mg | Freq: Four times a day (QID) | INTRAMUSCULAR | Status: DC | PRN

## 2016-01-12 MED ORDER — ACETAMINOPHEN 650 MG RE SUPP: 650.0000 mg | Freq: Four times a day (QID) | RECTAL | Status: DC | PRN

## 2016-01-12 MED ORDER — VITAMIN B-12 1000 MCG PO TABS
1000.0000 ug | ORAL_TABLET | Freq: Every day | ORAL | Status: DC
  Administered 2016-01-12 – 2016-01-14 (×3): 1000 ug via ORAL
  Filled 2016-01-12 (×3): qty 1

## 2016-01-12 MED ORDER — GLIPIZIDE ER 10 MG PO TB24: 20.0000 mg | ORAL_TABLET | Freq: Two times a day (BID) | ORAL | Status: DC

## 2016-01-12 MED ORDER — SODIUM CHLORIDE 0.9 % IV SOLN
INTRAVENOUS | Status: DC
  Administered 2016-01-12 – 2016-01-13 (×2): via INTRAVENOUS

## 2016-01-12 MED ORDER — PIPERACILLIN-TAZOBACTAM 3.375 G IVPB
3.3750 g | Freq: Three times a day (TID) | INTRAVENOUS | Status: DC
Start: 2016-01-12 — End: 2016-01-14
  Administered 2016-01-12 – 2016-01-14 (×7): 3.375 g via INTRAVENOUS
  Filled 2016-01-12 (×9): qty 50

## 2016-01-12 MED ORDER — INSULIN ASPART 100 UNIT/ML ~~LOC~~ SOLN
0.0000 [IU] | Freq: Three times a day (TID) | SUBCUTANEOUS | Status: DC
  Administered 2016-01-12: 5 [IU] via SUBCUTANEOUS
  Administered 2016-01-12 – 2016-01-13 (×4): 3 [IU] via SUBCUTANEOUS
  Administered 2016-01-13: 5 [IU] via SUBCUTANEOUS
  Filled 2016-01-12: qty 5
  Filled 2016-01-12 (×3): qty 3
  Filled 2016-01-12: qty 5
  Filled 2016-01-12 (×2): qty 3

## 2016-01-12 MED ORDER — ATORVASTATIN CALCIUM 20 MG PO TABS: 20.0000 mg | ORAL_TABLET | Freq: Every day | ORAL | Status: DC

## 2016-01-12 MED ORDER — ENOXAPARIN SODIUM 40 MG/0.4ML ~~LOC~~ SOLN: 40.0000 mg | SUBCUTANEOUS | Status: DC

## 2016-01-12 MED ORDER — ENOXAPARIN SODIUM 40 MG/0.4ML ~~LOC~~ SOLN
40.0000 mg | Freq: Every day | SUBCUTANEOUS | Status: DC
  Administered 2016-01-12 – 2016-01-13 (×2): 40 mg via SUBCUTANEOUS
  Filled 2016-01-12 (×4): qty 0.4

## 2016-01-12 MED ORDER — LORATADINE 10 MG PO TABS
10.0000 mg | ORAL_TABLET | Freq: Every day | ORAL | Status: DC
  Administered 2016-01-12 – 2016-01-14 (×3): 10 mg via ORAL
  Filled 2016-01-12 (×3): qty 1

## 2016-01-12 MED ORDER — GABAPENTIN 400 MG PO CAPS
1600.0000 mg | ORAL_CAPSULE | Freq: Every day | ORAL | Status: DC
  Administered 2016-01-12 – 2016-01-13 (×2): 1600 mg via ORAL
  Filled 2016-01-12 (×3): qty 4

## 2016-01-12 MED ORDER — OXYCODONE-ACETAMINOPHEN 5-325 MG PO TABS
1.0000 | ORAL_TABLET | ORAL | Status: DC | PRN
  Administered 2016-01-12 – 2016-01-14 (×4): 1 via ORAL
  Filled 2016-01-12 (×4): qty 1

## 2016-01-12 MED ORDER — LOSARTAN POTASSIUM 50 MG PO TABS
25.0000 mg | ORAL_TABLET | Freq: Every day | ORAL | Status: DC
  Administered 2016-01-12: 25 mg via ORAL
  Filled 2016-01-12: qty 1

## 2016-01-12 MED ORDER — INSULIN ASPART 100 UNIT/ML ~~LOC~~ SOLN
7.0000 [IU] | Freq: Three times a day (TID) | SUBCUTANEOUS | Status: DC
  Administered 2016-01-12 – 2016-01-13 (×6): 7 [IU] via SUBCUTANEOUS
  Filled 2016-01-12 (×7): qty 7

## 2016-01-12 MED ORDER — GABAPENTIN 400 MG PO CAPS
800.0000 mg | ORAL_CAPSULE | Freq: Every day | ORAL | Status: DC
  Administered 2016-01-12 – 2016-01-14 (×3): 800 mg via ORAL
  Filled 2016-01-12 (×3): qty 2

## 2016-01-12 MED ORDER — ONDANSETRON HCL 4 MG PO TABS: 4.0000 mg | ORAL_TABLET | Freq: Four times a day (QID) | ORAL | Status: DC | PRN

## 2016-01-12 MED ORDER — ASPIRIN EC 81 MG PO TBEC
81.0000 mg | DELAYED_RELEASE_TABLET | Freq: Every day | ORAL | Status: DC
  Administered 2016-01-12 – 2016-01-14 (×3): 81 mg via ORAL
  Filled 2016-01-12 (×3): qty 1

## 2016-01-12 MED ORDER — ALBUTEROL SULFATE (2.5 MG/3ML) 0.083% IN NEBU: 3.0000 mL | INHALATION_SOLUTION | Freq: Four times a day (QID) | RESPIRATORY_TRACT | Status: DC | PRN

## 2016-01-12 NOTE — Progress Notes (Addendum)
Initial Nutrition Assessment   INTERVENTION:   -Cater to pt preferences, on Carb Modified diet order -Will recommend Glucerna Shake po daily, each supplement provides 220 kcal and 10 grams of protein   NUTRITION DIAGNOSIS:   Inadequate oral intake related to poor appetite as evidenced by per patient/family report.  GOAL:   Patient will meet greater than or equal to 90% of their needs  MONITOR:   PO intake, Supplement acceptance, Labs, Weight trends, I & O's, Skin  REASON FOR ASSESSMENT:   Malnutrition Screening Tool    ASSESSMENT:   Pt with h/o left fifth toe amputation, right Charcot arthropathy, left plantar diabetic toe ulcer chronic [facility] admitted with sepsis secondary to right foot injury/mild cellulitis.  Past Medical History  Diagnosis Date  . Fibromyalgia   . Diabetes mellitus without complication (HCC)   . Hypertension   . Asthma   . H/O percutaneous left heart catheterization      Diet Order:  Diet Carb Modified Fluid consistency:: Thin; Room service appropriate?: Yes   Pt reports not eating lunch as she ate a late breakfast. Pt reports only eating eggs this am at breakfast as she did not really have an appetite.  Pt reports po intake fluctuates greatly and has been ever since her surgery 3 years ago.  Pt reports medications have changed her appetite greatly. Pt reports sometimes she eats all the time and then sometimes she doesn't want to eat much. Pt reports drinking Ensure PTA.  Medications: NS at 15200mL/hr, Vitamin B12, Lantus, Novolog, Metformin  Labs: reviewed, Na 131 yesterday, Glucose 344  Gastrointestinal Profile: Last BM:  01/11/2016   Nutrition-Focused Physical Exam Findings: Nutrition-Focused physical exam completed. Findings are WDL for fat depletion, muscle depletion, and edema.    Weight Change: Pt reports weight of 400lbs over 3 years ago. More recently pt reports weight has been '200-something.' Per [facility] weight  encounters 201lbs 2 months ago.   Skin:  Reviewed, no issues (Left great toe amputation noted)   Height:   Ht Readings from Last 1 Encounters:  01/11/16 5\' 8"  (1.727 m)    Weight:   Wt Readings from Last 1 Encounters:  01/11/16 202 lb 12.8 oz (91.989 kg)    Ideal Body Weight:   64kg  BMI:  Body mass index is 30.84 kg/(m^2).  Estimated Nutritional Needs:   Kcal:  1720-2033kcals  Protein:  71-83g protein  Fluid:  >1.8L fluid  EDUCATION NEEDS:   No education needs identified at this time  Leda QuailAllyson Shyanna Klingel, RD, LDN Pager [phone] Weekend/On-Call Pager [phone]

## 2016-01-12 NOTE — Care Management Important Message (Signed)
Important Message  Patient Details  Name: Jackie ArgyleCathy D Medel MRN: [id number] Date of Birth: [birth year]   Medicare Important Message Given:  Yes    Chesnee Floren A, RN 01/12/2016, 6:54 AM

## 2016-01-12 NOTE — Progress Notes (Signed)
Inpatient Diabetes Program Recommendations  AACE/ADA: New Consensus Statement on Inpatient Glycemic Control (2015)  Target Ranges:  Prepandial:   less than 140 mg/dL      Peak postprandial:   less than 180 mg/dL (1-2 hours)      Critically ill patients:  140 - 180 mg/dL   Review of Glycemic Control  Results for Jackie Miles, [name] (MRN [id number]) as of 01/12/2016 10:47  Ref. Range 01/12/2016 08:11  Glucose-Capillary Latest Ref Range: 65-99 mg/dL 119188 (H)    Diabetes history: Type 2 Outpatient Diabetes medications: Lantus 20 units at bed, Metformin 1000mg  bid, Glucotrol 20 mg bid, Novolog 7 units tid with meals.  Current orders for Inpatient glycemic control: Lantus 20 units at bed, Metformin 1000mg  bid, Glucotrol 20 mg bid, Novolog 7 units tid with meals, Novolog 0-15 units tid with meals  Inpatient Diabetes Program Recommendations: Agree with current orders for diabetes medications.  Susette RacerJulie Jerrik Housholder, RN, BA, MHA, CDE Diabetes Coordinator Inpatient Diabetes Program  [phone] (Team Pager) [phone] [facility] Office) 01/12/2016 10:59 AM

## 2016-01-12 NOTE — Progress Notes (Signed)
Pharmacy Antibiotic Note  Jackie Miles is a 50 y.o. female admitted on 01/11/2016 with sepsis.  Pharmacy has been consulted for Zosyn and vancomycin dosing.  Plan: 1. Zosyn 3.375 gm IV Q8H EI 2. Vancomycin 1 gm IV x 1 in ED followed by 1.25 gm IV Q12H, predicted trough 15 mcg/mL. Pharmacy will continue to follow and adjust as needed to maintain trough 15 to 20 mcg/mL.   Vd 52.3 L, Ke 0.08 hr-1, T1/2 8.6 hr  Height: 5\' 8"  (172.7 cm) Weight: 202 lb 12.8 oz (91.989 kg) IBW/kg (Calculated) : 63.9  Temp (24hrs), Avg:99.4 F (37.4 C), Min:98.4 F (36.9 C), Max:100.3 F (37.9 C)   Recent Labs Lab 01/11/16 1645 01/11/16 1943  WBC 21.1*  --   CREATININE 0.86  --   LATICACIDVEN 1.7 1.3    Estimated Creatinine Clearance: 91.8 mL/min (by C-G formula based on Cr of 0.86).    Allergies  Allergen Reactions  . Ibuprofen Other (See Comments)    Acid reflux     Thank you for allowing pharmacy to be a part of this patient's care.  Jackie FrostNathan A Yasmen Miles, Pharm.D., BCPS Clinical Pharmacist 01/12/2016 4:08 AM

## 2016-01-12 NOTE — Progress Notes (Signed)
Spoke to Dr. Imogene Burnhen about the rash on the patient's left forearm. We will continue to monitor.

## 2016-01-12 NOTE — Consult Note (Signed)
Reason for Consult: Puncture wound right foot with cellulitis and symptoms of sepsis Referring Physician: [facility]  Jackie Miles is an 50 y.o. female.  HPI: [name] relates that 2 days ago she stepped on a sprinkler that was located in the yard. She did have a small wound beneath her right foot. Recently started to develop some significant fever and general not feeling well and presented to the emergency department. He was noted to have a significantly elevated white count and was admitted for IV antibiotics and evaluation. States that she does see the wound care center down at [facility] on a weekly basis to have been managing Charcot changes in her right foot. States she is scheduled in the near future to have a consult with a surgeon in [facility] about the possibility of reconstruction for her Charcot foot.  Past Medical History  Diagnosis Date  . Fibromyalgia   . Diabetes mellitus without complication ([place])   . Hypertension   . Asthma   . H/O percutaneous left heart catheterization     History reviewed. No pertinent past surgical history.  No family history on file.  Social History:  reports that she has been smoking.  She does not have any smokeless tobacco history on file. Her alcohol and drug histories are not on file.  Allergies:  Allergies  Allergen Reactions  . Ibuprofen Other (See Comments)    Acid reflux     Medications:  Scheduled: . amitriptyline  100 mg Oral QHS  . aspirin EC  81 mg Oral Daily  . atorvastatin  20 mg Oral q1800  . enoxaparin (LOVENOX) injection  40 mg Subcutaneous Daily  . fluticasone  2 spray Each Nare BID  . gabapentin  1,600 mg Oral Daily   And  . gabapentin  800 mg Oral Q2200  . glipiZIDE  20 mg Oral BID WC  . insulin aspart  0-15 Units Subcutaneous TID WC  . insulin aspart  7 Units Subcutaneous TID WC  . insulin glargine  20 Units Subcutaneous QHS  . loratadine  10 mg Oral Daily  . losartan  25 mg Oral Daily  .  metFORMIN  1,000 mg Oral BID WC  . piperacillin-tazobactam (ZOSYN)  IV  3.375 g Intravenous 3 times per day  . vancomycin  1,250 mg Intravenous Q12H  . vitamin B-12  1,000 mcg Oral Daily    Results for orders placed or performed during the hospital encounter of 01/11/16 (from the past 48 hour(s))  Comprehensive metabolic panel     Status: Abnormal   Collection Time: 01/11/16  4:45 PM  Result Value Ref Range   Sodium 131 (L) 135 - 145 mmol/L   Potassium 3.9 3.5 - 5.1 mmol/L   Chloride 100 (L) 101 - 111 mmol/L   CO2 22 22 - 32 mmol/L   Glucose, Bld 344 (H) 65 - 99 mg/dL   BUN 13 6 - 20 mg/dL   Creatinine, Ser 0.86 0.44 - 1.00 mg/dL   Calcium 8.7 (L) 8.9 - 10.3 mg/dL   Total Protein 6.9 6.5 - 8.1 g/dL   Albumin 3.7 3.5 - 5.0 g/dL   AST 13 (L) 15 - 41 U/L   ALT 15 14 - 54 U/L   Alkaline Phosphatase 73 38 - 126 U/L   Total Bilirubin 1.0 0.3 - 1.2 mg/dL   GFR calc non Af Amer >60 >60 mL/min   GFR calc Af Amer >60 >60 mL/min    Comment: (NOTE) The  eGFR has been calculated using the CKD EPI equation. This calculation has not been validated in all clinical situations. eGFR's persistently <60 mL/min signify possible Chronic Kidney Disease.    Anion gap 9 5 - 15  Lactic acid, plasma     Status: None   Collection Time: 01/11/16  4:45 PM  Result Value Ref Range   Lactic Acid, Venous 1.7 0.5 - 2.0 mmol/L  CBC with Differential     Status: Abnormal   Collection Time: 01/11/16  4:45 PM  Result Value Ref Range   WBC 21.1 (H) 3.6 - 11.0 K/uL   RBC 4.06 3.80 - 5.20 MIL/uL   Hemoglobin 11.2 (L) 12.0 - 16.0 g/dL   HCT 33.5 (L) 35.0 - 47.0 %   MCV 82.4 80.0 - 100.0 fL   MCH 27.7 26.0 - 34.0 pg   MCHC 33.6 32.0 - 36.0 g/dL   RDW 14.0 11.5 - 14.5 %   Platelets 228 150 - 440 K/uL   Neutrophils Relative % 83 %   Neutro Abs 17.3 (H) 1.4 - 6.5 K/uL   Lymphocytes Relative 9 %   Lymphs Abs 2.0 1.0 - 3.6 K/uL   Monocytes Relative 8 %   Monocytes Absolute 1.7 (H) 0.2 - 0.9 K/uL   Eosinophils  Relative 0 %   Eosinophils Absolute 0.0 0 - 0.7 K/uL   Basophils Relative 0 %   Basophils Absolute 0.1 0 - 0.1 K/uL  Urinalysis complete, with microscopic     Status: Abnormal   Collection Time: 01/11/16  7:16 PM  Result Value Ref Range   Color, Urine YELLOW (A) YELLOW   APPearance HAZY (A) CLEAR   Glucose, UA >500 (A) NEGATIVE mg/dL   Bilirubin Urine NEGATIVE NEGATIVE   Ketones, ur NEGATIVE NEGATIVE mg/dL   Specific Gravity, Urine 1.015 1.005 - 1.030   Hgb urine dipstick NEGATIVE NEGATIVE   pH 6.0 5.0 - 8.0   Protein, ur NEGATIVE NEGATIVE mg/dL   Nitrite NEGATIVE NEGATIVE   Leukocytes, UA 1+ (A) NEGATIVE   RBC / HPF 0-5 0 - 5 RBC/hpf   WBC, UA 6-30 0 - 5 WBC/hpf   Bacteria, UA RARE (A) NONE SEEN   Squamous Epithelial / LPF 6-30 (A) NONE SEEN  Urine culture     Status: None (Preliminary result)   Collection Time: 01/11/16  7:16 PM  Result Value Ref Range   Specimen Description URINE, RANDOM    Special Requests NONE    Culture NO GROWTH < 24 HOURS    Report Status PENDING   Lactic acid, plasma     Status: None   Collection Time: 01/11/16  7:43 PM  Result Value Ref Range   Lactic Acid, Venous 1.3 0.5 - 2.0 mmol/L  CBC     Status: Abnormal   Collection Time: 01/12/16  5:14 AM  Result Value Ref Range   WBC 16.2 (H) 3.6 - 11.0 K/uL   RBC 3.68 (L) 3.80 - 5.20 MIL/uL   Hemoglobin 10.5 (L) 12.0 - 16.0 g/dL   HCT 30.9 (L) 35.0 - 47.0 %   MCV 84.0 80.0 - 100.0 fL   MCH 28.6 26.0 - 34.0 pg   MCHC 34.1 32.0 - 36.0 g/dL   RDW 14.2 11.5 - 14.5 %   Platelets 196 150 - 440 K/uL  Creatinine, serum     Status: None   Collection Time: 01/12/16  5:14 AM  Result Value Ref Range   Creatinine, Ser 0.88 0.44 - 1.00 mg/dL   GFR  calc non Af Amer >60 >60 mL/min   GFR calc Af Amer >60 >60 mL/min    Comment: (NOTE) The eGFR has been calculated using the CKD EPI equation. This calculation has not been validated in all clinical situations. eGFR's persistently <60 mL/min signify possible  Chronic Kidney Disease.   Glucose, capillary     Status: Abnormal   Collection Time: 01/12/16  8:11 AM  Result Value Ref Range   Glucose-Capillary 188 (H) 65 - 99 mg/dL    Dg Chest 2 View  01/11/2016  CLINICAL DATA:  Fever EXAM: CHEST  2 VIEW COMPARISON:  November 06, 2015 FINDINGS: Lungs are clear. Heart size and pulmonary vascularity are normal. No adenopathy. No bone lesions. IMPRESSION: No edema or consolidation. Electronically Signed   By: Lowella Grip III M.D.   On: 01/11/2016 17:33   Dg Foot Complete Right  01/11/2016  CLINICAL DATA:  Puncture wound bottom of foot 1 day prior. Report of prior fractures EXAM: RIGHT FOOT COMPLETE - 3+ VIEW COMPARISON:  None. FINDINGS: There is no radiopaque foreign body. There is evidence of age uncertain midfoot fractures with separation between the first and second metatarsals consistent with a divergent Lisfranc type injury. There is bony disruption in the distal tarsal row, consistent with age uncertain fractures throughout this area. There is a bony fragment located between the second proximal and more distal aspects of the metatarsal. This area appears well corticated. There is evidence of an old fracture along the medial proximal medial cuneiform bone. There is pes planus. There is mild narrowing of the first MTP joint.  No erosive changes. 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 These fractures are age uncertain with several areas appearing well corticated. All of this abnormality may be chronic. Given the extent of abnormality, correlation with the CT of the foot without contrast is advised to better assess the anatomy in multiple planes. There is pes planus. No radiopaque foreign body identified. No soft tissue abscess appreciable. Electronically Signed   By: Lowella Grip III M.D.   On: 01/11/2016 17:38    Review of Systems  Constitutional:  Positive for fever and malaise/fatigue.  HENT: Negative.   Eyes: Negative.   Respiratory: Negative.   Cardiovascular: Negative.   Gastrointestinal: Negative.   Genitourinary: Negative.   Musculoskeletal: Positive for joint pain.       Pain in the right mid foot after stepping on the sprinkler  Skin: Negative for itching and rash.       She does have a chronic dry sore on her left great toe.  Neurological:       Significant numbness and paresthesias from the neuropathy related to her diabetes  Endo/Heme/Allergies: Negative.   Psychiatric/Behavioral: Negative.    Blood pressure 107/56, pulse 108, temperature 98.5 F (36.9 C), temperature source Oral, resp. rate 18, height _0  (1.727 m), weight 91.989 kg (202 lb 12.8 oz), SpO2 92 %. Physical Exam  Cardiovascular:  DP and PT pulses are fully palpable. Capillary filling time intact  Musculoskeletal:  Previous amputation of the left fifth toe. Chronic Charcot joint changes in the right foot with gross instability on range of motion of the forefoot  Neurological:  Complete loss of protective threshold monofilament wire in the feet and toes bilateral. Proprioception is impaired  Skin:  There is some mild erythema and edema in the right foot as compared to the left. A small open  wound is noted on the plantar aspect of the right midfoot beneath the Charcot prominence with the sprinkler punctured the skin. Upon debridement there is a full-thickness ulcerative area extending through the deep subcutaneous tissues. No clear evidence of foreign body is noted. Only a minimal amount of purulence could be expressed. The ulcer measures approximately 3 mm diameter but does tunnel somewhat and extends to a depth of about 1 cm. No clear evidence of probe to bone    Assessment/Plan: Assessment: 1. Puncture wound with full-thickness ulceration and mild abscess right midfoot. 2. Chronic Charcot arthropathy right foot. 3. Diabetes with associated  neuropathy.  Plan: Excisional debridement of the ulceration on the right midfoot was performed full thickness including some of the deep subcutaneous tissue sharply utilizing a pair of tissue nippers. Circumference of the full-thickness ulcerative area was approximately 3 mm with a larger area of superficial ulceration approximately 1.5 cm diameter. A sterile saline wet-to-dry gauze packing was placed in the wound. A culture was taken for sensitivities. At this point there does not appear to be any major abscess noted and hopefully she will do well with local wound care and oral antibiotics. Patient states she is well aware with local wound care from previous injuries. She should be stable to follow-up outpatient with the wound care center for which she has an appointment next week. We will follow-up with her periodically until her discharge.  Durward Fortes 01/12/2016, 10:42 AM

## 2016-01-12 NOTE — Progress Notes (Addendum)
[facility] [place] [facility]   PATIENT NAME: Jackie MesiCathy Miles    MR#:  [id number]  DATE OF BIRTH:  August 22, 1966  SUBJECTIVE:  CHIEF COMPLAINT:   Chief Complaint  Patient presents with  . Foot Pain  right foot pain. rash limited on the patient's left forearm, no itch or tenderness. S/p Excisional debridement of the ulceration on the right midfoot  REVIEW OF SYSTEMS:  CONSTITUTIONAL: No fever, fatigue or weakness.  EYES: No blurred or double vision.  EARS, NOSE, AND THROAT: No tinnitus or ear pain.  RESPIRATORY: No cough, shortness of breath, wheezing or hemoptysis.  CARDIOVASCULAR: No chest pain, orthopnea, edema.  GASTROINTESTINAL: No nausea, vomiting, diarrhea or abdominal pain.  GENITOURINARY: No dysuria, hematuria.  ENDOCRINE: No polyuria, nocturia,  HEMATOLOGY: No anemia, easy bruising or bleeding SKIN: No rash or lesion. MUSCULOSKELETAL: right foot pain.Marland Kitchen.   NEUROLOGIC: No tingling, numbness, weakness.  PSYCHIATRY: No anxiety or depression.   DRUG ALLERGIES:   Allergies  Allergen Reactions  . Ibuprofen Other (See Comments)    Acid reflux     VITALS:  Blood pressure 111/59, pulse 107, temperature 99.4 F (37.4 C), temperature source Oral, resp. rate 18, height 5\' 8"  (1.727 m), weight 91.989 kg (202 lb 12.8 oz), SpO2 100 %.  PHYSICAL EXAMINATION:  GENERAL:  50 y.o.-year-old patient lying in the bed with no acute distress.  EYES: Pupils equal, round, reactive to light and accommodation. No scleral icterus. Extraocular muscles intact.  HEENT: Head atraumatic, normocephalic. Oropharynx and nasopharynx clear.  NECK:  Supple, no jugular venous distention. No thyroid enlargement, no tenderness.  LUNGS: Normal breath sounds bilaterally, no wheezing, rales,rhonchi or crepitation. No use of accessory muscles of respiration.  CARDIOVASCULAR: S1, S2, tachycardia. No murmurs, rubs, or gallops.  ABDOMEN: Soft, nontender, nondistended. Bowel sounds  present. No organomegaly or mass.  EXTREMITIES: No pedal edema, cyanosis, or clubbing. chronic changes of infection in the left foot. Right foot in dressing. NEUROLOGIC: Cranial nerves II through XII are intact. Muscle strength 5/5 in all extremities. Sensation intact. Gait not checked.  PSYCHIATRIC: The patient is alert and oriented x 3.  SKIN: rash on the patient's left forearm.    LABORATORY PANEL:   CBC  Recent Labs Lab 01/12/16 0514  WBC 16.2*  HGB 10.5*  HCT 30.9*  PLT 196   ------------------------------------------------------------------------------------------------------------------  Chemistries   Recent Labs Lab 01/11/16 1645 01/12/16 0514  NA 131*  --   K 3.9  --   CL 100*  --   CO2 22  --   GLUCOSE 344*  --   BUN 13  --   CREATININE 0.86 0.88  CALCIUM 8.7*  --   AST 13*  --   ALT 15  --   ALKPHOS 73  --   BILITOT 1.0  --    ------------------------------------------------------------------------------------------------------------------  Cardiac Enzymes No results for input(s): TROPONINI in the last 168 hours. ------------------------------------------------------------------------------------------------------------------  RADIOLOGY:  Dg Chest 2 View  01/11/2016  CLINICAL DATA:  Fever EXAM: CHEST  2 VIEW COMPARISON:  November 06, 2015 FINDINGS: Lungs are clear. Heart size and pulmonary vascularity are normal. No adenopathy. No bone lesions. IMPRESSION: No edema or consolidation. Electronically Signed   By: Bretta BangWilliam  Woodruff III M.D.   On: 01/11/2016 17:33   Dg Foot Complete Right  01/11/2016  CLINICAL DATA:  Puncture wound bottom of foot 1 day prior. Report of prior fractures EXAM: RIGHT FOOT COMPLETE - 3+ VIEW COMPARISON:  None. FINDINGS: There is no radiopaque  foreign body. There is evidence of age uncertain midfoot fractures with separation between the first and second metatarsals consistent with a divergent Lisfranc type injury. There is bony  disruption in the distal tarsal row, consistent with age uncertain fractures throughout this area. There is a bony fragment located between the second proximal and more distal aspects of the metatarsal. This area appears well corticated. There is evidence of an old fracture along the medial proximal medial cuneiform bone. There is pes planus. There is mild narrowing of the first MTP joint.  No erosive changes. 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 These fractures are age uncertain with several areas appearing well corticated. All of this abnormality may be chronic. Given the extent of abnormality, correlation with the CT of the foot without contrast is advised to better assess the anatomy in multiple planes. There is pes planus. No radiopaque foreign body identified. No soft tissue abscess appreciable. Electronically Signed   By: Bretta Bang III M.D.   On: 01/11/2016 17:38    EKG:   Orders placed or performed during the hospital encounter of 11/06/15  . ED EKG  . ED EKG  . EKG 12-Lead  . EKG 12-Lead    ASSESSMENT AND PLAN:   Jackie Miles is a 50 y.o. female with a known history of type 2 diabetes, history of high cholesterol and is status post left fifth toe amputation, right Charcot arthropathy, left plantar diabetic toe ulcer chronic who follows at [facility] wound clinic and history of ongoing tobacco abuse comes to the emergency room after she started having pain with the right foot. Patient accidentally stepped on the water sprinkler which was on the floor that she did not notice she started having bleeding from her right foot  1. Sepsis with right foot mild cellulitis/injury S/p Excisional debridement of the ulceration on the right midfoot by Dr. Alberteen Spindle. Wound care. continue vancomycin and Zosyn. Follow CBC, wound and blood cultures.  2. Leukocytosis secondary to #1 Improving.  3. Tachycardia  due to #1, better.  4. Type 2 diabetes-uncontrolled Continue novolog tid and Lantus. Continue sliding scale.  5. Hypertension. Hold losartan due to low side BP.  Hyponatremia. NS iv, f/u BMP. rash on the patient's left forearm. Unclear etiology. Monitor.  All the records are reviewed and case discussed with Care Management/Social Workerr. Management plans discussed with the patient, family and they are in agreement.  CODE STATUS: full code.  TOTAL TIME TAKING CARE OF THIS PATIENT: 40 minutes.  Greater than 50% time was spent on coordination of care and face-to-face counseling.  POSSIBLE D/C IN 2 DAYS, DEPENDING ON CLINICAL CONDITION.   Shaune Pollack M.D on 01/12/2016 at 2:43 PM  Between 7am to 6pm - Pager - [phone]  After 6pm go to [email or web address] - password [email or web address] [facility]  [place]  Hospitalists  Office  [phone]  CC: Primary care physician; [facility]

## 2016-01-12 NOTE — Care Management Note (Signed)
Case Management Note  Patient Details  Name: Jackie Miles MRN: [id number] Date of Birth: 06-03-1966  Subjective/Objective:        49yo ms Jackie Miles was admitted 01/11/16 with right foot cellulitis. Resides at home with adult "friends". Hx: DM, Fibromalgia, HTN, Asthma, Right foot deformity ([facility] PCP=[facility]. Pharmacy=[facility] in [place]. Has a RW, BSC, Shower chair. No home oxygen and no current home health. Chose [facility] if she needs home health after discharge because she has used them in the past.  Friends will provide assistance at home and will provide transportation. Case management will follow for discharge planning.            Action/Plan:   Expected Discharge Date:                  Expected Discharge Plan:     In-House Referral:     Discharge planning Services     Post Acute Care Choice:    Choice offered to:     DME Arranged:    DME Agency:     HH Arranged:    HH Agency:     Status of Service:     Medicare Important Message Given:  Yes Date Medicare IM Given:    Medicare IM give by:    Date Additional Medicare IM Given:    Additional Medicare Important Message give by:     If discussed at Long Length of Stay Meetings, dates discussed:    Additional Comments:  Aislyn Hayse A, RN 01/12/2016, 9:17 AM

## 2016-01-13 LAB — BASIC METABOLIC PANEL
Anion gap: 5 (ref 5–15)
BUN: 11 mg/dL (ref 6–20)
CALCIUM: 8.4 mg/dL — AB (ref 8.9–10.3)
CHLORIDE: 104 mmol/L (ref 101–111)
CO2: 23 mmol/L (ref 22–32)
CREATININE: 1 mg/dL (ref 0.44–1.00)
GFR calc non Af Amer: >60 mL/min (ref >60)
Glucose, Bld: 225 mg/dL — ABNORMAL HIGH (ref 65–99)
Potassium: 3.8 mmol/L (ref 3.5–5.1)
SODIUM: 132 mmol/L — AB (ref 135–145)

## 2016-01-13 LAB — VANCOMYCIN, TROUGH: VANCOMYCIN TR: 16 ug/mL (ref 10–20)

## 2016-01-13 LAB — CBC
HCT: 28.5 % — ABNORMAL LOW (ref 35.0–47.0)
HEMOGLOBIN: 9.7 g/dL — AB (ref 12.0–16.0)
MCH: 28.8 pg (ref 26.0–34.0)
MCHC: 34.2 g/dL (ref 32.0–36.0)
MCV: 84.2 fL (ref 80.0–100.0)
Platelets: 180 10*3/uL (ref 150–440)
RBC: 3.39 MIL/uL — ABNORMAL LOW (ref 3.80–5.20)
RDW: 14.1 % (ref 11.5–14.5)
WBC: 12.7 10*3/uL — AB (ref 3.6–11.0)

## 2016-01-13 LAB — URINE CULTURE

## 2016-01-13 LAB — GLUCOSE, CAPILLARY
GLUCOSE-CAPILLARY: 211 mg/dL — AB (ref 65–99)
Glucose-Capillary: 173 mg/dL — ABNORMAL HIGH (ref 65–99)
Glucose-Capillary: 182 mg/dL — ABNORMAL HIGH (ref 65–99)
Glucose-Capillary: 129 mg/dL — ABNORMAL HIGH (ref 65–99)

## 2016-01-13 MED ORDER — METOPROLOL TARTRATE 25 MG PO TABS
12.5000 mg | ORAL_TABLET | Freq: Two times a day (BID) | ORAL | Status: DC
  Administered 2016-01-13 – 2016-01-14 (×2): 12.5 mg via ORAL
  Filled 2016-01-13 (×3): qty 1

## 2016-01-13 NOTE — Progress Notes (Signed)
Subjective: Patient seen. States that she is feeling much better as far as the pain in her foot.  Objective: Vital signs in last 24 hours: Temp:  [98 F (36.7 C)-101.1 F (38.4 C)] 100.1 F (37.8 C) (04/15 0745) Pulse Rate:  [107-115] 115 (04/15 0745) Resp:  [18-19] 18 (04/15 0745) BP: (96-124)/(39-83) 124/59 mmHg (04/15 0745) SpO2:  [91 %-99 %] 96 % (04/15 0745) Last BM Date: 01/09/16  Intake/Output from previous day: 04/14 0701 - 04/15 0700 In: 1830.3 [P.O.:240; I.V.:1490.3; IV Piggyback:100] Out: -  Intake/Output this shift:    The bandage on the right foot is significantly soiled from the patient walking. Moderate drainage is noted on the bandage. Upon removal there is still the full-thickness ulceration. Minimal cellulitis. No expressible pus from the wound.  Lab Results:   Recent Labs  01/12/16 0514 01/13/16 0429  WBC 16.2* 12.7*  HGB 10.5* 9.7*  HCT 30.9* 28.5*  PLT 196 180   BMET  Recent Labs  01/11/16 1645 01/12/16 0514 01/13/16 0429  NA 131*  --  132*  K 3.9  --  3.8  CL 100*  --  104  CO2 22  --  23  GLUCOSE 344*  --  225*  BUN 13  --  11  CREATININE 0.86 0.88 1.00  CALCIUM 8.7*  --  8.4*   PT/INR No results for input(s): LABPROT, INR in the last 72 hours. ABG No results for input(s): PHART, HCO3 in the last 72 hours.  Invalid input(s): PCO2, PO2  Studies/Results: Dg Chest 2 View  01/11/2016  CLINICAL DATA:  Fever EXAM: CHEST  2 VIEW COMPARISON:  November 06, 2015 FINDINGS: Lungs are clear. Heart size and pulmonary vascularity are normal. No adenopathy. No bone lesions. IMPRESSION: No edema or consolidation. Electronically Signed   By: Bretta Bang III M.D.   On: 01/11/2016 17:33   Dg Foot Complete Right  01/11/2016  CLINICAL DATA:  Puncture wound bottom of foot 1 day prior. Report of prior fractures EXAM: RIGHT FOOT COMPLETE - 3+ VIEW COMPARISON:  None. FINDINGS: There is no radiopaque foreign body. There is evidence of age uncertain  midfoot fractures with separation between the first and second metatarsals consistent with a divergent Lisfranc type injury. There is bony disruption in the distal tarsal row, consistent with age uncertain fractures throughout this area. There is a bony fragment located between the second proximal and more distal aspects of the metatarsal. This area appears well corticated. There is evidence of an old fracture along the medial proximal medial cuneiform bone. There is pes planus. There is mild narrowing of the first MTP joint.  No erosive changes. 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 These fractures are age uncertain with several areas appearing well corticated. All of this abnormality may be chronic. Given the extent of abnormality, correlation with the CT of the foot without contrast is advised to better assess the anatomy in multiple planes. There is pes planus. No radiopaque foreign body identified. No soft tissue abscess appreciable. Electronically Signed   By: Bretta Bang III M.D.   On: 01/11/2016 17:38    Anti-infectives: Anti-infectives    Start     Dose/Rate Route Frequency Ordered Stop   01/12/16 0500  vancomycin (VANCOCIN) 1,250 mg in sodium chloride 0.9 % 250 mL IVPB     1,250 mg 166.7 mL/hr over 90 Minutes Intravenous Every 12 hours 01/12/16 0407  01/12/16 0415  piperacillin-tazobactam (ZOSYN) IVPB 3.375 g     3.375 g 12.5 mL/hr over 240 Minutes Intravenous 3 times per day 01/12/16 0404     01/11/16 2200  piperacillin-tazobactam (ZOSYN) IVPB 3.375 g  Status:  Discontinued     3.375 g 100 mL/hr over 30 Minutes Intravenous 3 times per day 01/11/16 2143 01/12/16 0404   01/11/16 2145  vancomycin (VANCOCIN) IVPB 1000 mg/200 mL premix  Status:  Discontinued     1,000 mg 200 mL/hr over 60 Minutes Intravenous Every 24 hours 01/11/16 2143 01/12/16 0407   01/11/16 1830  vancomycin  (VANCOCIN) IVPB 1000 mg/200 mL premix     1,000 mg 200 mL/hr over 60 Minutes Intravenous  Once 01/11/16 1829 01/11/16 2017   01/11/16 1830  piperacillin-tazobactam (ZOSYN) IVPB 3.375 g     3.375 g 100 mL/hr over 30 Minutes Intravenous  Once 01/11/16 1829 01/11/16 1947      Assessment/Plan: s/p * No surgery found * Assessment: Full-thickness ulceration with abscess right midfoot, stable   Plan: The ulcer was repacked with sterile saline wet-to-dry gauze. Patient instructed to try to stay off of her foot with minimal walking. Again the patient should be stable from a foot standpoint for discharge and follow-up with her wound care center appointment later this coming week  LOS: 1 day    Ricci Barkerodd W Keiston Manley 01/13/2016

## 2016-01-13 NOTE — Progress Notes (Signed)
Pharmacy Antibiotic Note  Jackie ArgyleCathy D Michetti is a 50 y.o. female admitted on 01/11/2016 with sepsis.  Pharmacy has been consulted for Vancomycin dosing.  Plan: 4/15:  VT @ 16:21 = 16 mcg/mL.  Will continue this pt on Vanc 1250 mg IV Q12H.    Height: 5\' 8"  (172.7 cm) Weight: 202 lb 12.8 oz (91.989 kg) IBW/kg (Calculated) : 63.9  Temp (24hrs), Avg:100.1 F (37.8 C), Min:98 F (36.7 C), Max:102.8 F (39.3 C)   Recent Labs Lab 01/11/16 1645 01/11/16 1943 01/12/16 0514 01/13/16 0429 01/13/16 1621  WBC 21.1*  --  16.2* 12.7*  --   CREATININE 0.86  --  0.88 1.00  --   LATICACIDVEN 1.7 1.3  --   --   --   VANCOTROUGH  --   --   --   --  16    Estimated Creatinine Clearance: 78.9 mL/min (by C-G formula based on Cr of 1).    Allergies  Allergen Reactions  . Ibuprofen Other (See Comments)    Acid reflux     Antimicrobials this admission:   >>    >>   Dose adjustments this admission:   Microbiology results:  BCx:   UCx:    Sputum:    MRSA PCR:   Thank you for allowing pharmacy to be a part of this patient's care.  Rylie Knierim D 01/13/2016 6:01 PM

## 2016-01-13 NOTE — Progress Notes (Signed)
[facility] [place] at [facility]   PATIENT NAME: Jackie Miles    MR#:  [id number]  DATE OF BIRTH:  06-Oct-1965  SUBJECTIVE:  CHIEF COMPLAINT:   Chief Complaint  Patient presents with  . Foot Pain  right foot pain. rash limited on the patient's left forearm, no itch or tenderness. S/p Excisional debridement of the ulceration on the right midfoot  REVIEW OF SYSTEMS:  CONSTITUTIONAL: No fever, fatigue or weakness.  EYES: No blurred or double vision.  EARS, NOSE, AND THROAT: No tinnitus or ear pain.  RESPIRATORY: No cough, shortness of breath, wheezing or hemoptysis.  CARDIOVASCULAR: No chest pain, orthopnea, edema.  GASTROINTESTINAL: No nausea, vomiting, diarrhea or abdominal pain.  GENITOURINARY: No dysuria, hematuria.  ENDOCRINE: No polyuria, nocturia,  HEMATOLOGY: No anemia, easy bruising or bleeding SKIN: No rash or lesion. MUSCULOSKELETAL: right foot pain.Marland Kitchen   NEUROLOGIC: No tingling, numbness, weakness.  PSYCHIATRY: No anxiety or depression.   DRUG ALLERGIES:   Allergies  Allergen Reactions  . Ibuprofen Other (See Comments)    Acid reflux     VITALS:  Blood pressure 124/59, pulse 115, temperature 100.1 F (37.8 C), temperature source Oral, resp. rate 18, height  (1.727 m), weight 91.989 kg (202 lb 12.8 oz), SpO2 96 %.  PHYSICAL EXAMINATION:  GENERAL:  50 y.o.-year-old patient lying in the bed with no acute distress.  EYES: Pupils equal, round, reactive to light and accommodation. No scleral icterus. Extraocular muscles intact.  HEENT: Head atraumatic, normocephalic. Oropharynx and nasopharynx clear.  NECK:  Supple, no jugular venous distention. No thyroid enlargement, no tenderness.  LUNGS: Normal breath sounds bilaterally, no wheezing, rales,rhonchi or crepitation. No use of accessory muscles of respiration.  CARDIOVASCULAR: S1, S2, tachycardia. No murmurs, rubs, or gallops.  ABDOMEN: Soft, nontender, nondistended. Bowel sounds  present. No organomegaly or mass.  EXTREMITIES: No pedal edema, cyanosis, or clubbing. chronic changes of infection in the left foot. Right foot in dressing. NEUROLOGIC: Cranial nerves II through XII are intact. Muscle strength 5/5 in all extremities. Sensation intact. Gait not checked.  PSYCHIATRIC: The patient is alert and oriented x 3.  SKIN: rash on the patient's left forearm.    LABORATORY PANEL:   CBC  Recent Labs Lab 01/13/16 0429  WBC 12.7*  HGB 9.7*  HCT 28.5*  PLT 180   ------------------------------------------------------------------------------------------------------------------  Chemistries   Recent Labs Lab 01/11/16 1645  01/13/16 0429  NA 131*  --  132*  K 3.9  --  3.8  CL 100*  --  104  CO2 22  --  23  GLUCOSE 344*  --  225*  BUN 13  --  11  CREATININE 0.86  < > 1.00  CALCIUM 8.7*  --  8.4*  AST 13*  --   --   ALT 15  --   --   ALKPHOS 73  --   --   BILITOT 1.0  --   --   < > = values in this interval not displayed. ------------------------------------------------------------------------------------------------------------------  Cardiac Enzymes No results for input(s): TROPONINI in the last 168 hours. ------------------------------------------------------------------------------------------------------------------  RADIOLOGY:  Dg Chest 2 View  01/11/2016  CLINICAL DATA:  Fever EXAM: CHEST  2 VIEW COMPARISON:  November 06, 2015 FINDINGS: Lungs are clear. Heart size and pulmonary vascularity are normal. No adenopathy. No bone lesions. IMPRESSION: No edema or consolidation. Electronically Signed   By: Bretta Bang III M.D.   On: 01/11/2016 17:33   Dg Foot Complete Right  01/11/2016  CLINICAL DATA:  Puncture wound bottom of foot 1 day prior. Report of prior fractures EXAM: RIGHT FOOT COMPLETE - 3+ VIEW COMPARISON:  None. FINDINGS: There is no radiopaque foreign body. There is evidence of age uncertain midfoot fractures with separation between the  first and second metatarsals consistent with a divergent Lisfranc type injury. There is bony disruption in the distal tarsal row, consistent with age uncertain fractures throughout this area. There is a bony fragment located between the second proximal and more distal aspects of the metatarsal. This area appears well corticated. There is evidence of an old fracture along the medial proximal medial cuneiform bone. There is pes planus. There is mild narrowing of the first MTP joint.  No erosive changes. 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 These fractures are age uncertain with several areas appearing well corticated. All of this abnormality may be chronic. Given the extent of abnormality, correlation with the CT of the foot without contrast is advised to better assess the anatomy in multiple planes. There is pes planus. No radiopaque foreign body identified. No soft tissue abscess appreciable. Electronically Signed   By: Bretta BangWilliam  Woodruff III M.D.   On: 01/11/2016 17:38    EKG:   Orders placed or performed during the hospital encounter of 11/06/15  . ED EKG  . ED EKG  . EKG 12-Lead  . EKG 12-Lead    ASSESSMENT AND PLAN:   Lavada MesiCathy Boughner is a 50 y.o. female with a known history of type 2 diabetes, history of high cholesterol and is status post left fifth toe amputation, right Charcot arthropathy, left plantar diabetic toe ulcer chronic who [facility] and history of ongoing tobacco abuse comes to the emergency room after she started having pain with the right foot. Patient accidentally stepped on the water sprinkler which was on the floor that she did not notice she started having bleeding from her right foot  1. Sepsis with right foot mild cellulitis/injury S/p Excisional debridement of the ulceration on the right midfoot by Dr. Alberteen Spindleline. Wound care. continue vancomycin and Zosyn. Follow  CBC, wound and blood cultures. Bactrim for 14 days after discharge per Dr. Alberteen Spindleline.  2. Leukocytosis secondary to #1 Improving.  3. Tachycardia due to #1, still tachycardia at 110-115. Per patient, she has chronic tachycardia. Add low dose Lopressor if BP tolerates.  4. Type 2 diabetes-better controlled Continue novolog tid and Lantus. Continue sliding scale.  5. Hypertension. Hold losartan due to low side BP.  Hyponatremia. NS iv, f/u BMP. rash on the patient's left forearm. Unclear etiology. Improved.  I discussed with Dr. Alberteen Spindleline. All the records are reviewed and case discussed with Care Management/Social Workerr. Management plans discussed with the patient, family and they are in agreement.  CODE STATUS: full code.  TOTAL TIME TAKING CARE OF THIS PATIENT: 38 minutes.  Greater than 50% time was spent on coordination of care and face-to-face counseling.  POSSIBLE D/C IN 2 DAYS, DEPENDING ON CLINICAL CONDITION.   Shaune Pollackhen, Kieran Nachtigal M.D on 01/13/2016 at 2:48 PM  Between 7am to 6pm - Pager - [phone]  After 6pm go to [email or web address] - password [email or web address] [facility]  [facility] [place] [place] [facility]  Office  [phone]  CC: Primary care physician; [facility]

## 2016-01-14 LAB — GLUCOSE, CAPILLARY: Glucose-Capillary: 175 mg/dL — ABNORMAL HIGH (ref 65–99)

## 2016-01-14 MED ORDER — SULFAMETHOXAZOLE-TRIMETHOPRIM 800-160 MG PO TABS: 1.0000 | ORAL_TABLET | Freq: Two times a day (BID) | ORAL | Status: DC

## 2016-01-14 MED ORDER — METOPROLOL TARTRATE 25 MG PO TABS: 12.5000 mg | ORAL_TABLET | Freq: Two times a day (BID) | ORAL | Status: DC

## 2016-01-14 NOTE — Discharge Instructions (Signed)
Heart healthy and ADA diet. °Activity as tolerated. °Wound care. °

## 2016-01-14 NOTE — Care Management Note (Signed)
Case Management Note  Patient Details  Name: Jackie ArgyleCathy D Miles MRN: [id number] Date of Birth: 1966-08-27  Subjective/Objective:       No HH order at time of discharge. [name] is an [facility].             Action/Plan:   Expected Discharge Date:                  Expected Discharge Plan:     In-House Referral:     Discharge planning Services     Post Acute Care Choice:    Choice offered to:     DME Arranged:    DME Agency:     HH Arranged:    HH Agency:     Status of Service:     Medicare Important Message Given:  Yes Date Medicare IM Given:    Medicare IM give by:    Date Additional Medicare IM Given:    Additional Medicare Important Message give by:     If discussed at Long Length of Stay Meetings, dates discussed:    Additional Comments:  Breydan Shillingburg A, RN 01/14/2016, 10:31 AM

## 2016-01-14 NOTE — Discharge Summary (Signed)
[facility] [place] at [facility]   PATIENT NAME: Jackie MesiCathy Kilman    MR#:  [id number]  DATE OF BIRTH:  [birth year]  DATE OF ADMISSION:  01/11/2016 ADMITTING PHYSICIAN: Enedina FinnerSona Patel, MD  DATE OF DISCHARGE: 01/14/2016 PRIMARY CARE PHYSICIAN: [facility]    ADMISSION DIAGNOSIS:  Cellulitis of right lower extremity [L03.115]   DISCHARGE DIAGNOSIS:   Sepsis with right foot mild cellulitis/injury SECONDARY DIAGNOSIS:   Past Medical History  Diagnosis Date  . Fibromyalgia   . Diabetes mellitus without complication (HCC)   . Hypertension   . Asthma   . H/O percutaneous left heart catheterization     HOSPITAL COURSE:   Jackie Miles is a 50 y.o. female with a known history of type 2 diabetes, history of high cholesterol and is status post left fifth toe amputation, right Charcot arthropathy, left plantar diabetic toe ulcer chronic who [facility] and history of ongoing tobacco abuse comes to the emergency room after she started having pain with the right foot. Patient accidentally stepped on the water sprinkler which was on the floor that she did not notice she started having bleeding from her right foot  1. Sepsis with right foot mild cellulitis/injury S/p Excisional debridement of the ulceration on the right midfoot by Dr. Alberteen Spindleline. Wound care. continue vancomycin and Zosyn. wound and blood cultures are negative so far . Bactrim for 14 days after discharge per Dr. Alberteen Spindleline.  2. Leukocytosis secondary to #1 Improving.  3. Tachycardia due to #1, still tachycardia at 110-115. Per patient, she has chronic tachycardia. Added low dose Lopressor if BP tolerates.  4. Type 2 diabetes-better controlled Continue novolog tid and Lantus. Continue sliding scale.  5. Hypertension. Hold losartan due to low side BP.  Hyponatremia.  she has been treated with NS iv, f/u BMP as outpatient . rash on the patient's left forearm. Unclear etiology.  Improved.   DISCHARGE CONDITIONS:   Stable, discharge to home today.  CONSULTS OBTAINED:  Treatment Team:  Linus Galasodd Cline, DPM  DRUG ALLERGIES:   Allergies  Allergen Reactions  . Ibuprofen Other (See Comments)    Acid reflux     DISCHARGE MEDICATIONS:   Current Discharge Medication List    START taking these medications   Details  metoprolol tartrate (LOPRESSOR) 25 MG tablet Take 0.5 tablets (12.5 mg total) by mouth 2 (two) times daily. Qty: 30 tablet, Refills: 0    sulfamethoxazole-trimethoprim (BACTRIM DS,SEPTRA DS) 800-160 MG tablet Take 1 tablet by mouth 2 (two) times daily. Qty: 24 tablet, Refills: 0      CONTINUE these medications which have NOT CHANGED   Details  acetaminophen (TYLENOL) 325 MG tablet Take 650 mg by mouth every 4 (four) hours as needed.    albuterol (PROAIR HFA) 108 (90 Base) MCG/ACT inhaler Inhale 1 puff into the lungs every 6 (six) hours as needed.    amitriptyline (ELAVIL) 100 MG tablet Take 100 mg by mouth at bedtime. Refills: 0    aspirin EC 81 MG tablet Take 81 mg by mouth daily.    atorvastatin (LIPITOR) 20 MG tablet Take 20 mg by mouth daily.    cetirizine (ZYRTEC) 10 MG tablet Take 10 mg by mouth daily.    Cyanocobalamin (RA VITAMIN B-12 TR) 1000 MCG TBCR Take 1,000 mcg by mouth daily.    fluticasone (FLONASE) 50 MCG/ACT nasal spray Place 2 sprays into both nostrils 2 (two) times daily.    gabapentin (NEURONTIN) 800 MG tablet Take 800-1,600  mg by mouth 2 (two) times daily. Take  in morning and take  in evening    insulin aspart (NOVOLOG FLEXPEN) 100 UNIT/ML FlexPen Inject 7 Units into the skin 3 (three) times daily with meals.    insulin glargine (LANTUS) 100 UNIT/ML injection Inject 20 Units into the skin at bedtime.      STOP taking these medications     losartan (COZAAR) 25 MG tablet          DISCHARGE INSTRUCTIONS:    If you experience worsening of your admission symptoms, develop shortness of breath,  life threatening emergency, suicidal or homicidal thoughts you must seek medical attention immediately by calling 911 or calling your MD immediately  if symptoms less severe.  You Must read complete instructions/literature along with all the possible adverse reactions/side effects for all the Medicines you take and that have been prescribed to you. Take any new Medicines after you have completely understood and accept all the possible adverse reactions/side effects.   Please note  You were cared for by a hospitalist during your hospital stay. If you have any questions about your discharge medications or the care you received while you were in the hospital after you are discharged, you can call the unit and asked to speak with the hospitalist on call if the hospitalist that took care of you is not available. Once you are discharged, your primary care physician will handle any further medical issues. Please note that NO REFILLS for any discharge medications will be authorized once you are discharged, as it is imperative that you return to your primary care physician (or establish a relationship with a primary care physician if you do not have one) for your aftercare needs so that they can reassess your need for medications and monitor your lab values.    Today   SUBJECTIVE    no complaint.    VITAL SIGNS:  Blood pressure 108/61, pulse 117, temperature 99.8 F (37.7 C), temperature source Axillary, resp. rate 18, height  (1.727 m), weight 91.989 kg (202 lb 12.8 oz), SpO2 90 %.  I/O:   Intake/Output Summary (Last 24 hours) at 01/14/16 1130 Last data filed at 01/14/16 0400  Gross per 24 hour  Intake 3018.33 ml  Output      0 ml  Net 3018.33 ml    PHYSICAL EXAMINATION:  GENERAL:  50 y.o.-year-old patient lying in the bed with no acute distress.  EYES: Pupils equal, round, reactive to light and accommodation. No scleral icterus. Extraocular muscles intact.  HEENT: Head atraumatic,  normocephalic. Oropharynx and nasopharynx clear.  NECK:  Supple, no jugular venous distention. No thyroid enlargement, no tenderness.  LUNGS: Normal breath sounds bilaterally, no wheezing, rales,rhonchi or crepitation. No use of accessory muscles of respiration.  CARDIOVASCULAR: S1, S2 normal. No murmurs, rubs, or gallops.  ABDOMEN: Soft, non-tender, non-distended. Bowel sounds present. No organomegaly or mass.  EXTREMITIES: No pedal edema, cyanosis, or clubbing.  right foot in dressing.  NEUROLOGIC: Cranial nerves II through XII are intact. Muscle strength 5/5 in all extremities. Sensation intact. Gait not checked.  PSYCHIATRIC: The patient is alert and oriented x 3.  SKIN: No obvious rash, lesion, or ulcer.   DATA REVIEW:   CBC  Recent Labs Lab 01/13/16 0429  WBC 12.7*  HGB 9.7*  HCT 28.5*  PLT 180    Chemistries   Recent Labs Lab 01/11/16 1645  01/13/16 0429  NA 131*  --  132*  K 3.9  --  3.8  CL 100*  --  104  CO2 22  --  23  GLUCOSE 344*  --  225*  BUN 13  --  11  CREATININE 0.86  < > 1.00  CALCIUM 8.7*  --  8.4*  AST 13*  --   --   ALT 15  --   --   ALKPHOS 73  --   --   BILITOT 1.0  --   --   < > = values in this interval not displayed.  Cardiac Enzymes No results for input(s): TROPONINI in the last 168 hours.  Microbiology Results  Results for orders placed or performed during the hospital encounter of 01/11/16  Culture, blood (Routine x 2)     Status: None (Preliminary result)   Collection Time: 01/11/16  4:45 PM  Result Value Ref Range Status   Specimen Description BLOOD RIGHT ARM  Final   Special Requests BOTTLES DRAWN AEROBIC AND ANAEROBIC 7CC  Final   Culture NO GROWTH 3 DAYS  Final   Report Status PENDING  Incomplete  Culture, blood (Routine x 2)     Status: None (Preliminary result)   Collection Time: 01/11/16  7:16 PM  Result Value Ref Range Status   Specimen Description BLOOD RIGHT ARM  Final   Special Requests   Final    BOTTLES DRAWN  AEROBIC AND ANAEROBIC 10CC AERO 8CC ANA   Culture NO GROWTH 3 DAYS  Final   Report Status PENDING  Incomplete  Urine culture     Status: None   Collection Time: 01/11/16  7:16 PM  Result Value Ref Range Status   Specimen Description URINE, RANDOM  Final   Special Requests NONE  Final   Culture MULTIPLE SPECIES PRESENT, SUGGEST RECOLLECTION  Final   Report Status 01/13/2016 FINAL  Final  Wound culture     Status: None (Preliminary result)   Collection Time: 01/12/16 11:01 AM  Result Value Ref Range Status   Specimen Description WOUND  Final   Special Requests NONE  Final   Gram Stain   Final    MANY RED BLOOD CELLS RARE WBC SEEN NO ORGANISMS SEEN    Culture LIGHT GROWTH NORMAL SKIN FLORA  Final   Report Status PENDING  Incomplete    RADIOLOGY:  No results found.      Management plans discussed with the patient, family and they are in agreement.  CODE STATUS:     Code Status Orders        Start     Ordered   01/12/16 0328  Full code   Continuous     01/12/16 0327    Code Status History    Date Active Date Inactive Code Status Order ID Comments User Context   This patient has a current code status but no historical code status.      TOTAL TIME TAKING CARE OF THIS PATIENT: 32 minutes.    Shaune Pollack M.D on 01/14/2016 at 11:30 AM  Between 7am to 6pm - Pager - [phone]  After 6pm go to [email or web address] - password [email or web address] [facility]  [place] [facility]  Office  [phone]  CC: Primary care physician; [facility]

## 2016-01-15 LAB — WOUND CULTURE: Culture: NORMAL

## 2016-01-16 LAB — CULTURE, BLOOD (ROUTINE X 2)
CULTURE: NO GROWTH
Culture: NO GROWTH

## 2016-01-22 ENCOUNTER — Inpatient Hospital Stay
Admission: EM | Admit: 2016-01-22 | Discharge: 2016-01-22 | DRG: 638 | Discharge status: Against Medical Advice | Payer: Medicaid Other | Attending: Internal Medicine | Admitting: Internal Medicine

## 2016-01-22 ENCOUNTER — Emergency Department: Payer: Medicaid Other

## 2016-01-22 DIAGNOSIS — I10 Essential (primary) hypertension: Secondary | ICD-10-CM | POA: Diagnosis present

## 2016-01-22 DIAGNOSIS — E1169 Type 2 diabetes mellitus with other specified complication: Principal | ICD-10-CM | POA: Diagnosis present

## 2016-01-22 DIAGNOSIS — F17200 Nicotine dependence, unspecified, uncomplicated: Secondary | ICD-10-CM | POA: Diagnosis present

## 2016-01-22 DIAGNOSIS — Z7982 Long term (current) use of aspirin: Secondary | ICD-10-CM

## 2016-01-22 DIAGNOSIS — R059 Cough, unspecified: Secondary | ICD-10-CM

## 2016-01-22 DIAGNOSIS — J45909 Unspecified asthma, uncomplicated: Secondary | ICD-10-CM | POA: Diagnosis present

## 2016-01-22 DIAGNOSIS — M797 Fibromyalgia: Secondary | ICD-10-CM | POA: Diagnosis present

## 2016-01-22 DIAGNOSIS — Z79899 Other long term (current) drug therapy: Secondary | ICD-10-CM

## 2016-01-22 DIAGNOSIS — Z7951 Long term (current) use of inhaled steroids: Secondary | ICD-10-CM

## 2016-01-22 DIAGNOSIS — Z794 Long term (current) use of insulin: Secondary | ICD-10-CM

## 2016-01-22 DIAGNOSIS — D72829 Elevated white blood cell count, unspecified: Secondary | ICD-10-CM | POA: Diagnosis present

## 2016-01-22 DIAGNOSIS — Z888 Allergy status to other drugs, medicaments and biological substances status: Secondary | ICD-10-CM | POA: Diagnosis not present

## 2016-01-22 DIAGNOSIS — M869 Osteomyelitis, unspecified: Secondary | ICD-10-CM | POA: Diagnosis present

## 2016-01-22 DIAGNOSIS — R609 Edema, unspecified: Secondary | ICD-10-CM

## 2016-01-22 DIAGNOSIS — E785 Hyperlipidemia, unspecified: Secondary | ICD-10-CM | POA: Diagnosis present

## 2016-01-22 DIAGNOSIS — G629 Polyneuropathy, unspecified: Secondary | ICD-10-CM | POA: Diagnosis present

## 2016-01-22 DIAGNOSIS — R05 Cough: Secondary | ICD-10-CM

## 2016-01-22 DIAGNOSIS — M86171 Other acute osteomyelitis, right ankle and foot: Secondary | ICD-10-CM

## 2016-01-22 DIAGNOSIS — L089 Local infection of the skin and subcutaneous tissue, unspecified: Secondary | ICD-10-CM | POA: Diagnosis present

## 2016-01-22 LAB — BASIC METABOLIC PANEL
ANION GAP: 10 (ref 5–15)
BUN: 12 mg/dL (ref 6–20)
CHLORIDE: 99 mmol/L — AB (ref 101–111)
CO2: 25 mmol/L (ref 22–32)
Calcium: 9.2 mg/dL (ref 8.9–10.3)
Creatinine, Ser: 1.06 mg/dL — ABNORMAL HIGH (ref 0.44–1.00)
GFR calc non Af Amer: 60 mL/min — ABNORMAL LOW (ref >60)
GLUCOSE: 274 mg/dL — AB (ref 65–99)
Potassium: 4.3 mmol/L (ref 3.5–5.1)
Sodium: 134 mmol/L — ABNORMAL LOW (ref 135–145)

## 2016-01-22 LAB — CBC
HEMATOCRIT: 31.4 % — AB (ref 35.0–47.0)
HEMOGLOBIN: 10.5 g/dL — AB (ref 12.0–16.0)
MCH: 27.5 pg (ref 26.0–34.0)
MCHC: 33.5 g/dL (ref 32.0–36.0)
MCV: 82.2 fL (ref 80.0–100.0)
Platelets: 508 10*3/uL — ABNORMAL HIGH (ref 150–440)
RBC: 3.82 MIL/uL (ref 3.80–5.20)
RDW: 14.4 % (ref 11.5–14.5)
WBC: 17.5 10*3/uL — AB (ref 3.6–11.0)

## 2016-01-22 LAB — URINALYSIS COMPLETE WITH MICROSCOPIC (ARMC ONLY)
BACTERIA UA: NONE SEEN
Bilirubin Urine: NEGATIVE
GLUCOSE, UA: NEGATIVE mg/dL
HGB URINE DIPSTICK: NEGATIVE
Ketones, ur: NEGATIVE mg/dL
Leukocytes, UA: NEGATIVE
NITRITE: NEGATIVE
PH: 6 (ref 5.0–8.0)
PROTEIN: NEGATIVE mg/dL
RBC / HPF: NONE SEEN RBC/hpf (ref 0–5)
SPECIFIC GRAVITY, URINE: 1.003 — AB (ref 1.005–1.030)

## 2016-01-22 LAB — RAPID INFLUENZA A&B ANTIGENS (ARMC ONLY)
INFLUENZA A (ARMC): NEGATIVE
INFLUENZA B (ARMC): NEGATIVE

## 2016-01-22 LAB — SEDIMENTATION RATE: Sed Rate: 79 mm/hr — ABNORMAL HIGH (ref 0–30)

## 2016-01-22 MED ORDER — VITAMIN B-12 1000 MCG PO TABS: 1000.0000 ug | ORAL_TABLET | Freq: Every day | ORAL | Status: DC

## 2016-01-22 MED ORDER — HYDROMORPHONE HCL 1 MG/ML IJ SOLN
0.5000 mg | Freq: Once | INTRAMUSCULAR | Status: AC
  Administered 2016-01-22: 0.5 mg via INTRAVENOUS
  Filled 2016-01-22: qty 1

## 2016-01-22 MED ORDER — SODIUM CHLORIDE 0.9% FLUSH: 3.0000 mL | INTRAVENOUS | Status: DC | PRN

## 2016-01-22 MED ORDER — ASPIRIN EC 81 MG PO TBEC: 81.0000 mg | DELAYED_RELEASE_TABLET | Freq: Every day | ORAL | Status: DC

## 2016-01-22 MED ORDER — INSULIN ASPART 100 UNIT/ML ~~LOC~~ SOLN: 0.0000 [IU] | Freq: Three times a day (TID) | SUBCUTANEOUS | Status: DC

## 2016-01-22 MED ORDER — HYDROCODONE-ACETAMINOPHEN 5-325 MG PO TABS: 1.0000 | ORAL_TABLET | ORAL | Status: DC | PRN

## 2016-01-22 MED ORDER — ACETAMINOPHEN 325 MG PO TABS
650.0000 mg | ORAL_TABLET | ORAL | Status: DC | PRN
Start: 2016-01-22 — End: 2016-01-22

## 2016-01-22 MED ORDER — PIPERACILLIN-TAZOBACTAM 3.375 G IVPB 30 MIN
3.3750 g | Freq: Once | INTRAVENOUS | Status: AC
  Administered 2016-01-22: 3.375 g via INTRAVENOUS
  Filled 2016-01-22: qty 50

## 2016-01-22 MED ORDER — ONDANSETRON HCL 4 MG/2ML IJ SOLN: 4.0000 mg | Freq: Four times a day (QID) | INTRAMUSCULAR | Status: DC | PRN

## 2016-01-22 MED ORDER — SODIUM CHLORIDE 0.9% FLUSH: 3.0000 mL | Freq: Two times a day (BID) | INTRAVENOUS | Status: DC

## 2016-01-22 MED ORDER — INSULIN GLARGINE 100 UNIT/ML ~~LOC~~ SOLN
20.0000 [IU] | Freq: Every day | SUBCUTANEOUS | Status: DC
  Filled 2016-01-22: qty 0.2

## 2016-01-22 MED ORDER — VANCOMYCIN HCL 10 G IV SOLR: 1500.0000 mg | Freq: Once | INTRAVENOUS | Status: DC

## 2016-01-22 MED ORDER — ONDANSETRON HCL 4 MG PO TABS
4.0000 mg | ORAL_TABLET | Freq: Four times a day (QID) | ORAL | Status: DC | PRN
Start: 2016-01-22 — End: 2016-01-22

## 2016-01-22 MED ORDER — ACETAMINOPHEN 650 MG RE SUPP
650.0000 mg | Freq: Four times a day (QID) | RECTAL | Status: DC | PRN
Start: 2016-01-22 — End: 2016-01-22

## 2016-01-22 MED ORDER — ALBUTEROL SULFATE (2.5 MG/3ML) 0.083% IN NEBU: 2.5000 mg | INHALATION_SOLUTION | Freq: Four times a day (QID) | RESPIRATORY_TRACT | Status: DC | PRN

## 2016-01-22 MED ORDER — VANCOMYCIN HCL 10 G IV SOLR
1500.0000 mg | Freq: Once | INTRAVENOUS | Status: AC
  Administered 2016-01-22: 1500 mg via INTRAVENOUS
  Filled 2016-01-22 (×2): qty 1500

## 2016-01-22 MED ORDER — INSULIN ASPART 100 UNIT/ML ~~LOC~~ SOLN
7.0000 [IU] | Freq: Three times a day (TID) | SUBCUTANEOUS | Status: DC
Start: 2016-01-23 — End: 2016-01-22

## 2016-01-22 MED ORDER — SODIUM CHLORIDE 0.9 % IV SOLN: 250.0000 mL | INTRAVENOUS | Status: DC | PRN

## 2016-01-22 MED ORDER — ENOXAPARIN SODIUM 40 MG/0.4ML ~~LOC~~ SOLN: 40.0000 mg | SUBCUTANEOUS | Status: DC

## 2016-01-22 MED ORDER — PIPERACILLIN-TAZOBACTAM 3.375 G IVPB
3.3750 g | Freq: Three times a day (TID) | INTRAVENOUS | Status: DC
  Filled 2016-01-22 (×3): qty 50

## 2016-01-22 MED ORDER — ACETAMINOPHEN 325 MG PO TABS: 650.0000 mg | ORAL_TABLET | Freq: Four times a day (QID) | ORAL | Status: DC | PRN

## 2016-01-22 MED ORDER — SODIUM CHLORIDE 0.9 % IV BOLUS (SEPSIS)
1000.0000 mL | Freq: Once | INTRAVENOUS | Status: AC
  Administered 2016-01-22: 1000 mL via INTRAVENOUS

## 2016-01-22 MED ORDER — ATORVASTATIN CALCIUM 20 MG PO TABS: 20.0000 mg | ORAL_TABLET | Freq: Every day | ORAL | Status: DC

## 2016-01-22 MED ORDER — GABAPENTIN 800 MG PO TABS: 1600.0000 mg | ORAL_TABLET | Freq: Every morning | ORAL | Status: DC

## 2016-01-22 MED ORDER — GABAPENTIN 400 MG PO CAPS: 800.0000 mg | ORAL_CAPSULE | Freq: Every evening | ORAL | Status: DC

## 2016-01-22 MED ORDER — FLUTICASONE PROPIONATE 50 MCG/ACT NA SUSP
2.0000 | Freq: Two times a day (BID) | NASAL | Status: DC
  Filled 2016-01-22: qty 16

## 2016-01-22 MED ORDER — AMITRIPTYLINE HCL 25 MG PO TABS: 100.0000 mg | ORAL_TABLET | Freq: Every day | ORAL | Status: DC

## 2016-01-22 MED ORDER — LORATADINE 10 MG PO TABS: 10.0000 mg | ORAL_TABLET | Freq: Every day | ORAL | Status: DC

## 2016-01-22 MED ORDER — METOPROLOL TARTRATE 25 MG PO TABS: 12.5000 mg | ORAL_TABLET | Freq: Two times a day (BID) | ORAL | Status: DC

## 2016-01-22 NOTE — ED Notes (Signed)
Pt transported to room 157. 

## 2016-01-22 NOTE — ED Provider Notes (Signed)
[facility] Emergency Department Provider Note  ____________________________________________  Time seen: Approximately 4:51 PM  I have reviewed the triage vital signs and the nursing notes.   HISTORY  Chief Complaint Wound Infection    HPI Jackie Miles is a 50 y.o. female with a history of right Charcot foot arthropathy, admitted 2 weeks ago for sepsis with right foot cellulitis, presenting with chills, cough, and diffuse body aches. Patient was seen and treated here for 4 days 2 weeks ago for foot cellulitis with sepsis; she was discharged on Bactrim which she is still taking. 7 days ago she was evaluated by her PMD who suggested evaluation at [facility]. She states that she went there but had to "piece out" because "they didn't know what they're talking about." My understanding is that they may have recommended right foot amputation. Since then, the patient's right foot has become more swollen, with increased pain and erythema which has spread to the distal tibia. She has been tired, unable to get out of bed, and has had diffuse body aches. She has also developed a nonproductive cough without sore throat or ear pain. She denies any nausea, vomiting, diarrhea, urinary symptoms.  SH: Denies IV drug abuse   Past Medical History  Diagnosis Date  . Fibromyalgia   . Diabetes mellitus without complication (HCC)   . Hypertension   . Asthma   . H/O percutaneous left heart catheterization     Patient Active Problem List   Diagnosis Date Noted  . Sepsis (HCC) 01/12/2016    History reviewed. No pertinent past surgical history.  Current Outpatient Rx  Name  Route  Sig  Dispense  Refill  . acetaminophen (TYLENOL) 325 MG tablet   Oral   Take 650 mg by mouth every 4 (four) hours as needed.         Marland Kitchen albuterol (PROAIR HFA) 108 (90 Base) MCG/ACT inhaler   Inhalation   Inhale 1 puff into the lungs every 6 (six) hours as needed.         Marland Kitchen amitriptyline (ELAVIL) 100  MG tablet   Oral   Take 100 mg by mouth at bedtime.      0   . aspirin EC 81 MG tablet   Oral   Take 81 mg by mouth daily.         Marland Kitchen atorvastatin (LIPITOR) 20 MG tablet   Oral   Take 20 mg by mouth daily.         . cetirizine (ZYRTEC) 10 MG tablet   Oral   Take 10 mg by mouth daily.         . Cyanocobalamin (RA VITAMIN B-12 TR) 1000 MCG TBCR   Oral   Take 1,000 mcg by mouth daily.         . fluticasone (FLONASE) 50 MCG/ACT nasal spray   Each Nare   Place 2 sprays into both nostrils 2 (two) times daily.         Marland Kitchen gabapentin (NEURONTIN) 800 MG tablet   Oral   Take 800-1,600 mg by mouth 2 (two) times daily. Take 1600mg  in morning and take 800mg  in evening         . insulin aspart (NOVOLOG FLEXPEN) 100 UNIT/ML FlexPen   Subcutaneous   Inject 7 Units into the skin 3 (three) times daily with meals.         . insulin glargine (LANTUS) 100 UNIT/ML injection   Subcutaneous   Inject 20 Units into the  skin at bedtime.         . metoprolol tartrate (LOPRESSOR) 25 MG tablet   Oral   Take 0.5 tablets (12.5 mg total) by mouth 2 (two) times daily.   30 tablet   0     Hold if SBP<110 or DBP<60   . sulfamethoxazole-trimethoprim (BACTRIM DS,SEPTRA DS) 800-160 MG tablet   Oral   Take 1 tablet by mouth 2 (two) times daily.   24 tablet   0     Allergies Ibuprofen  No family history on file.  Social History Social History  Substance Use Topics  . Smoking status: Current Every Day Smoker  . Smokeless tobacco: None  . Alcohol Use: None    Review of Systems Constitutional: No fever, positive chills. No lightheadedness or syncope. Positive generalized malaise. Eyes: No visual changes. ENT: No sore throat. No congestion or rhinorrhea. Cardiovascular: Denies chest pain. Denies palpitations. Respiratory: Denies shortness of breath.  No cough. Gastrointestinal: No abdominal pain.  No nausea, no vomiting.  No diarrhea.  No constipation. Genitourinary:  Negative for dysuria. Musculoskeletal: Diffuse myalgias. Diffuse chronic back pain that is unchanged. Skin: Positive right foot nonhealing wound without discharge, but now with increased swelling, and erythema to the lower right leg. Neurological: Negative for headaches. No focal numbness, tingling or weakness. Positive diffuse numbness which she attributes to her diabetic neuropathy.  10-point ROS otherwise negative.  ____________________________________________   PHYSICAL EXAM:  VITAL SIGNS: ED Triage Vitals  Enc Vitals Group     BP 01/22/16 1414 130/58 mmHg     Pulse Rate 01/22/16 1414 102     Resp 01/22/16 1414 18     Temp 01/22/16 1414 98.4 F (36.9 C)     Temp Source 01/22/16 1414 Oral     SpO2 01/22/16 1414 98 %     Weight 01/22/16 1414 210 lb (95.255 kg)     Height 01/22/16 1414 5\' 8"  (1.727 m)     Head Cir --      Peak Flow --      Pain Score 01/22/16 1419 0     Pain Loc --      Pain Edu? --      Excl. in GC? --     Constitutional: Alert and oriented. Well appearing and in no acute distress. Answers questions appropriately. Eyes: Conjunctivae are normal.  EOMI. No scleral icterus. Head: Atraumatic. Nose: No congestion/rhinnorhea. Mouth/Throat: Mucous membranes are moist.  Neck: No stridor.  Supple.  No meningismus Cardiovascular: Normal rate, regular rhythm. No murmurs, rubs or gallops.  Respiratory: Normal respiratory effort.  No accessory muscle use or retractions. Lungs CTAB.  No wheezes, rales or ronchi. Gastrointestinal: Obese. Soft, nontender and nondistended.  No guarding or rebound.  No peritoneal signs. Musculoskeletal: Right foot has typical deformity of Charcot arthropathy that also has associated diffuse swelling which goes into the distal right leg. She on the plantar aspect of the foot she has a 2 cm open wound which is not draining any fluid. She has mild tenderness around this wound. The patient has erythema overlying the distal tibia on the right.  Decreased sensation to light touch throughout the foot. Cap refill 3 seconds in the foot. Neurologic:  A&Ox3.  Speech is clear.  Face and smile are symmetric.  EOMI.  Moves all extremities well. Skin:  See muscle skeletal exam for skin findings. Psychiatric: Mood and affect are normal. Speech and behavior are normal.  Normal judgement.  ____________________________________________   LABS (all labs  ordered are listed, but only abnormal results are displayed)  Labs Reviewed  CBC - Abnormal; Notable for the following:    WBC 17.5 (*)    Hemoglobin 10.5 (*)    HCT 31.4 (*)    Platelets 508 (*)    All other components within normal limits  BASIC METABOLIC PANEL - Abnormal; Notable for the following:    Sodium 134 (*)    Chloride 99 (*)    Glucose, Bld 274 (*)    Creatinine, Ser 1.06 (*)    GFR calc non Af Amer 60 (*)    All other components within normal limits  CULTURE, BLOOD (ROUTINE X 2)  CULTURE, BLOOD (ROUTINE X 2)  URINE CULTURE  RAPID INFLUENZA A&B ANTIGENS ([facility] ONLY)  SEDIMENTATION RATE  C-REACTIVE PROTEIN  URINALYSIS COMPLETEWITH MICROSCOPIC ([facility] ONLY)   ____________________________________________  EKG  Not indicated  ____________________________________________  RADIOLOGY  Dg Chest 2 View  01/22/2016  CLINICAL DATA:  Patient with possible infection to the foot.  Fever. EXAM: CHEST  2 VIEW COMPARISON:  Chest radiograph 01/11/2016. FINDINGS: Stable cardiac and mediastinal contours. No consolidative pulmonary opacities. No pleural effusion or pneumothorax. Unremarkable osseous skeleton. IMPRESSION: No acute cardiopulmonary process. Electronically Signed   By: Annia Belt M.D.   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  There is bony overgrowth at the tarsal metatarsal junction. No acute fracture. Soft tissue swelling inferior to the midfoot is noted. There is discontinuity of the cortex along the base of the cuboid. There is an overlying soft tissue defect pes planus deformity is noted. IMPRESSION: Findings are worrisome for osteomyelitis of the cuboid an associated soft tissue inflammatory process. MRI may be helpful. Chronic changes are noted from prior Lisfranc injury or Charcot deformity. Electronically Signed   By: Jolaine Click M.D.   On: 01/22/2016 17:29    ____________________________________________   PROCEDURES  Procedure(s) performed: None  Critical Care performed: No ____________________________________________   INITIAL IMPRESSION / ASSESSMENT AND PLAN / ED COURSE  Pertinent labs & imaging results that were available during my care of the patient were reviewed by me and considered in my medical decision making (see chart for details).  50 y.o. female with nonhealing wound on the right foot in the setting of Charcot arthropathy now with increasing swelling and erythema, as well as systemic symptoms. Here, the patient is not febrile and she has a minimal tachycardia but normal blood pressure. Overall, I am concerned for worsening infection in her right foot, including soft tissue infection as well as osteomyelitis. DVT is much less likely as a cause for her symptoms. Given her cough, I'm also concerned about possible pneumonia. In the setting of both of these possible infections, will initiate empiric Vancomycin and Zosyn after blood cultures and urine culture are obtained. She will be admitted to the hospital for further evaluation and treatment.  ----------------------------------------- 5:52 PM on 01/22/2016 -----------------------------------------  The patient has a white blood cell count of 17.5 which is higher than her previous numbers. She has a minimal acute renal insufficiency. Her  x-ray is consistent with possible osteomyelitis and the patient will require further imaging for full evaluation. Her chest x-ray does not show pneumonia and I'm awaiting the results of her influenza testing. At this time,  I will contact the podiatrist on-call, and plan admission for this patient.  ____________________________________________  FINAL CLINICAL IMPRESSION(S) / ED DIAGNOSES  Final diagnoses:  Acute osteomyelitis of right foot (HCC)  Cough      NEW MEDICATIONS STARTED DURING THIS VISIT:  New Prescriptions   No medications on file     Rockne Menghini, MD 01/22/16 1754

## 2016-01-22 NOTE — Progress Notes (Signed)
Called by nurse stating that patient left AMA because she could not go outside to smoke

## 2016-01-22 NOTE — ED Notes (Signed)
Pt to xray

## 2016-01-22 NOTE — H&P (Addendum)
[facility] [place] [facility]   PATIENT NAME: Jackie Miles    MR#:  [id number]  DATE OF BIRTH:  12/01/1965  DATE OF ADMISSION:  01/22/2016  PRIMARY CARE PHYSICIAN: [facility]   REQUESTING/REFERRING PHYSICIAN: Rockne Menghini MD  CHIEF COMPLAINT:   Chief Complaint  Patient presents with  . Wound Infection    HISTORY OF PRESENT ILLNESS: Jackie Miles  is a 50 y.o. female with a known history of  Right foot infection Who was recently hospitalized here and had debridement of her foot by podiatry. And was discharged on oral antibiotics. Patient subsequently was seen at [facility]. And was there for 2 days. Apparently they told her she needed some sort amputation. And she left. She presents to the hospital here with complaint of having chills. She reports that she's been having pain all over. She is noted to have swelling of the right foot as well as a ulcer. She is also noticed to have a leukocytosis. X-ray suggestive of osteomyelitis. Patient does complain of pain in the foot. Denies any chest pains or palpitations     PAST MEDICAL HISTORY:   Past Medical History  Diagnosis Date  . Fibromyalgia   . Diabetes mellitus without complication (HCC)   . Hypertension   . Asthma   . H/O percutaneous left heart catheterization     PAST SURGICAL HISTORY: History reviewed. No pertinent past surgical history.  SOCIAL HISTORY:  Social History  Substance Use Topics  . Smoking status: Current Every Day Smoker  . Smokeless tobacco: Not on file  . Alcohol Use: Not on file    FAMILY HISTORY: No family history on file.  DRUG ALLERGIES:  Allergies  Allergen Reactions  . Ibuprofen Other (See Comments)    Acid reflux     REVIEW OF SYSTEMS:   CONSTITUTIONAL: No fever,Positive chills fatigue or weakness.  EYES: No blurred or double vision.  EARS, NOSE, AND THROAT: No tinnitus or ear pain.  RESPIRATORY: No cough, shortness of breath, wheezing or  hemoptysis.  CARDIOVASCULAR: No chest pain, orthopnea, edema.  GASTROINTESTINAL: No nausea, vomiting, diarrhea or abdominal pain.  GENITOURINARY: No dysuria, hematuria.  ENDOCRINE: No polyuria, nocturia,  HEMATOLOGY: No anemia, easy bruising or bleeding SKIN: Right foot ulcer swelling,  MUSCULOSKELETAL: No joint pain or arthritis.   NEUROLOGIC: No tingling, numbness, weakness.  PSYCHIATRY: No anxiety or depression.   MEDICATIONS AT HOME:  Prior to Admission medications   Medication Sig Start Date End Date Taking? Authorizing Provider  acetaminophen (TYLENOL) 325 MG tablet Take 650 mg by mouth every 4 (four) hours as needed.    Historical Provider, MD  albuterol (PROAIR HFA) 108 (90 Base) MCG/ACT inhaler Inhale 1 puff into the lungs every 6 (six) hours as needed.    Historical Provider, MD  amitriptyline (ELAVIL) 100 MG tablet Take 100 mg by mouth at bedtime.    Historical Provider, MD  aspirin EC 81 MG tablet Take 81 mg by mouth daily.    Historical Provider, MD  atorvastatin (LIPITOR) 20 MG tablet Take 20 mg by mouth daily.    Historical Provider, MD  cetirizine (ZYRTEC) 10 MG tablet Take 10 mg by mouth daily.    Historical Provider, MD  Cyanocobalamin (RA VITAMIN B-12 TR) 1000 MCG TBCR Take 1,000 mcg by mouth daily.    Historical Provider, MD  fluticasone (FLONASE) 50 MCG/ACT nasal spray Place 2 sprays into both nostrils 2 (two) times daily.    Historical Provider, MD  gabapentin (NEURONTIN) 800 MG tablet Take 800-1,600 mg by mouth 2 (two) times daily. Take  in morning and take  in evening    Historical Provider, MD  insulin aspart (NOVOLOG FLEXPEN) 100 UNIT/ML FlexPen Inject 7 Units into the skin 3 (three) times daily with meals.    Historical Provider, MD  insulin glargine (LANTUS) 100 UNIT/ML injection Inject 20 Units into the skin at bedtime.    Historical Provider, MD  metoprolol tartrate (LOPRESSOR) 25 MG tablet Take 0.5 tablets (12.5 mg total) by mouth 2 (two) times  daily. 01/14/16   Shaune Pollack, MD  sulfamethoxazole-trimethoprim (BACTRIM DS,SEPTRA DS) 800-160 MG tablet Take 1 tablet by mouth 2 (two) times daily. 01/14/16   Shaune Pollack, MD      PHYSICAL EXAMINATION:   VITAL SIGNS: Blood pressure 130/58, pulse 102, temperature 98.4 F (36.9 C), temperature source Oral, resp. rate 18, height  (1.727 m), weight 95.255 kg (210 lb), SpO2 98 %.  GENERAL:  50 y.o.-year-old patient lying in the bed with no acute distress.  EYES: Pupils equal, round, reactive to light and accommodation. No scleral icterus. Extraocular muscles intact.  HEENT: Head atraumatic, normocephalic. Oropharynx and nasopharynx clear.  NECK:  Supple, no jugular venous distention. No thyroid enlargement, no tenderness.  LUNGS: Normal breath sounds bilaterally, no wheezing, rales,rhonchi or crepitation. No use of accessory muscles of respiration.  CARDIOVASCULAR: S1, S2 normal. No murmurs, rubs, or gallops.  ABDOMEN: Soft, nontender, nondistended. Bowel sounds present. No organomegaly or mass.  EXTREMITIES: No pedal edema, cyanosis, or clubbing.  NEUROLOGIC: Cranial nerves II through XII are intact. Muscle strength 5/5 in all extremities. Sensation intact. Gait not checked.  PSYCHIATRIC: The patient is alert and oriented x 3.  SKIN:  right foot is swollen and has a ulcer on the bottom of the foot. There is warmth and erythema on the legs   LABORATORY PANEL:   CBC  Recent Labs Lab 01/22/16 1421  WBC 17.5*  HGB 10.5*  HCT 31.4*  PLT 508*  MCV 82.2  MCH 27.5  MCHC 33.5  RDW 14.4   ------------------------------------------------------------------------------------------------------------------  Chemistries   Recent Labs Lab 01/22/16 1421  NA 134*  K 4.3  CL 99*  CO2 25  GLUCOSE 274*  BUN 12  CREATININE 1.06*  CALCIUM 9.2   ------------------------------------------------------------------------------------------------------------------ estimated creatinine clearance  is 75.8 mL/min (by C-G formula based on Cr of 1.06). ------------------------------------------------------------------------------------------------------------------ No results for input(s): TSH, T4TOTAL, T3FREE, THYROIDAB in the last 72 hours.  Invalid input(s): FREET3   Coagulation profile No results for input(s): INR, PROTIME in the last 168 hours. ------------------------------------------------------------------------------------------------------------------- No results for input(s): DDIMER in the last 72 hours. -------------------------------------------------------------------------------------------------------------------  Cardiac Enzymes No results for input(s): CKMB, TROPONINI, MYOGLOBIN in the last 168 hours.  Invalid input(s): CK ------------------------------------------------------------------------------------------------------------------ Invalid input(s): POCBNP  ---------------------------------------------------------------------------------------------------------------  Urinalysis    Component Value Date/Time   COLORURINE YELLOW* 01/11/2016 1916   APPEARANCEUR HAZY* 01/11/2016 1916   LABSPEC 1.015 01/11/2016 1916   PHURINE 6.0 01/11/2016 1916   GLUCOSEU >500* 01/11/2016 1916   HGBUR NEGATIVE 01/11/2016 1916   BILIRUBINUR NEGATIVE 01/11/2016 1916   KETONESUR NEGATIVE 01/11/2016 1916   PROTEINUR NEGATIVE 01/11/2016 1916   NITRITE NEGATIVE 01/11/2016 1916   LEUKOCYTESUR 1+* 01/11/2016 1916     RADIOLOGY: Dg Chest 2 View  01/22/2016  CLINICAL DATA:  Patient with possible infection to the foot.  Fever. EXAM: CHEST  2 VIEW COMPARISON:  Chest radiograph 01/11/2016. FINDINGS: Stable cardiac and mediastinal contours. No consolidative pulmonary opacities. No  pleural effusion or pneumothorax. Unremarkable osseous skeleton. IMPRESSION: No acute cardiopulmonary process. Electronically Signed   By: Annia Beltrew  Davis M.D.   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 There is bony overgrowth at the tarsal metatarsal junction. No acute fracture. Soft tissue swelling inferior to the midfoot is noted. There is discontinuity of the cortex along the base of the cuboid. There is an overlying soft tissue defect pes planus deformity is noted. IMPRESSION: Findings are worrisome for osteomyelitis of the cuboid an associated soft tissue inflammatory process. MRI may be helpful. Chronic changes are noted from prior Lisfranc injury or Charcot deformity. Electronically Signed   By: Jolaine ClickArthur  Hoss M.D.   On: 01/22/2016 17:29    EKG: Orders placed or performed during the hospital encounter of 11/06/15  . ED EKG  . ED EKG  . EKG 12-Lead  . EKG 12-Lead    IMPRESSION AND PLAN: Patient is a 50 year old African-American female present with chills  #1. Right foot infection likely has osteomyelitis of the foot I will ask podiatry to evaluate I will place her on vancomycin and Zosyn Follow-up blood culture  #2 diabetes type 2 continue Lantus and pre-meal insulin  #3 hypertension continue metoprolol  #4 hyperlipidemia continue Lipitor  #5 neuropathy continue Neurontin and Elavil  #6 miscellaneous we'll do Lovenox for DVT prophylaxis    All the records are reviewed and case discussed with ED provider. Management plans discussed with the patient, family and they are in agreement.  CODE STATUS:    Code Status Orders        Start     Ordered   01/22/16 1813  Full code   Continuous     01/22/16 1813    Code Status History    Date Active Date Inactive Code Status Order ID Comments User Context   01/12/2016  3:28 AM 01/14/2016  2:43 PM Full Code [id number]  Enedina FinnerSona Karolyn Messing, MD ED       TOTAL TIME TAKING CARE OF THIS PATIENT:  55minutes.    Auburn BilberryPATEL, Lysbeth Dicola M.D on 01/22/2016 at 6:20 PM  Between 7am to 6pm - Pager - [phone]  After 6pm go to [email or web address] - password [email or web address] [email or web address]  [facility]  Office  [phone]  CC: Primary care physician; [facility]

## 2016-01-22 NOTE — ED Notes (Signed)
Pt reports coming to ER because she is "cold all of the time". Pt hx of right foot infection; pt called PCP due to "being cold and fever" and told to come to ER for antibiotics. [facility] because they wanted to "cut into my foot". Pt alert and oriented X4, active, cooperative, pt in NAD. RR even and unlabored, color WNL.

## 2016-01-22 NOTE — Progress Notes (Signed)
Patient left AMA, patient upset that she cannot go off campus to smoke. Dr. Allena KatzPatel notified. Nursing supervisor Leona SingletonAleasha and charge nurse Gavin Poundeborah aware. Patient belongings pack and patient exited unit.

## 2016-01-23 LAB — C-REACTIVE PROTEIN: CRP: 7 mg/dL — ABNORMAL HIGH (ref <1.0)

## 2016-01-24 LAB — URINE CULTURE

## 2016-01-25 NOTE — Discharge Summary (Signed)
Jackie ArgyleCathy D Miles, 50 y.o., DOB Mar 12, 1966, MRN [id number]. Admission date: 01/22/2016 Discharge Date 01/25/2016 Primary MD [facility] Admitting Physician Auburn BilberryShreyang Camarion Weier, MD  AGAINST MEDICAL ADVICE summary   Admission Diagnosis  Cough [R05] Swelling [R60.9] Acute osteomyelitis of right foot [facility](HCC) [M86.171]  Discharge Diagnosis   Active Problems:   Osteomyelitis of the right foot  Diabetes type 2 Hypertension Asthma       Hospital Course  patient is a 50 year old African-American female who presented with chills. Was noted to have swelling of the right foot and concern for osteomyelitis. I admitted the patient to the hospital. Patient was started on antibiotics. An order for MRI and podiatry was done. However after being in the hospital for 2 hours she wanted to go smoke. Nurses told her that the policy was no smoking. Patient did not wait for me to go talk to her she just left AMA.            Auburn BilberryPATEL, Macgregor Aeschliman M.D on 01/25/2016 at 1:37 PM  [facility]   Office  [phone]

## 2016-01-27 LAB — CULTURE, BLOOD (ROUTINE X 2)
Culture: NO GROWTH
Culture: NO GROWTH

## 2016-02-21 ENCOUNTER — Inpatient Hospital Stay
Admission: EM | Admit: 2016-02-21 | Discharge: 2016-02-26 | DRG: 871 | Discharge status: Against Medical Advice | Payer: Medicaid Other | Attending: Internal Medicine | Admitting: Internal Medicine

## 2016-02-21 ENCOUNTER — Emergency Department: Payer: Medicaid Other

## 2016-02-21 ENCOUNTER — Encounter: Payer: Self-pay | Admitting: Emergency Medicine

## 2016-02-21 DIAGNOSIS — L97519 Non-pressure chronic ulcer of other part of right foot with unspecified severity: Secondary | ICD-10-CM | POA: Diagnosis present

## 2016-02-21 DIAGNOSIS — M869 Osteomyelitis, unspecified: Secondary | ICD-10-CM | POA: Diagnosis present

## 2016-02-21 DIAGNOSIS — Z794 Long term (current) use of insulin: Secondary | ICD-10-CM | POA: Diagnosis not present

## 2016-02-21 DIAGNOSIS — Z7982 Long term (current) use of aspirin: Secondary | ICD-10-CM

## 2016-02-21 DIAGNOSIS — B9561 Methicillin susceptible Staphylococcus aureus infection as the cause of diseases classified elsewhere: Secondary | ICD-10-CM | POA: Diagnosis present

## 2016-02-21 DIAGNOSIS — I10 Essential (primary) hypertension: Secondary | ICD-10-CM | POA: Diagnosis present

## 2016-02-21 DIAGNOSIS — L02611 Cutaneous abscess of right foot: Secondary | ICD-10-CM | POA: Diagnosis present

## 2016-02-21 DIAGNOSIS — Z8614 Personal history of Methicillin resistant Staphylococcus aureus infection: Secondary | ICD-10-CM | POA: Diagnosis not present

## 2016-02-21 DIAGNOSIS — L089 Local infection of the skin and subcutaneous tissue, unspecified: Secondary | ICD-10-CM

## 2016-02-21 DIAGNOSIS — F17210 Nicotine dependence, cigarettes, uncomplicated: Secondary | ICD-10-CM | POA: Diagnosis present

## 2016-02-21 DIAGNOSIS — E1169 Type 2 diabetes mellitus with other specified complication: Secondary | ICD-10-CM | POA: Diagnosis present

## 2016-02-21 DIAGNOSIS — A419 Sepsis, unspecified organism: Secondary | ICD-10-CM | POA: Diagnosis present

## 2016-02-21 DIAGNOSIS — M797 Fibromyalgia: Secondary | ICD-10-CM | POA: Diagnosis present

## 2016-02-21 DIAGNOSIS — A480 Gas gangrene: Secondary | ICD-10-CM | POA: Diagnosis present

## 2016-02-21 DIAGNOSIS — Z833 Family history of diabetes mellitus: Secondary | ICD-10-CM

## 2016-02-21 DIAGNOSIS — J45909 Unspecified asthma, uncomplicated: Secondary | ICD-10-CM | POA: Diagnosis present

## 2016-02-21 DIAGNOSIS — Z79899 Other long term (current) drug therapy: Secondary | ICD-10-CM

## 2016-02-21 DIAGNOSIS — E11621 Type 2 diabetes mellitus with foot ulcer: Secondary | ICD-10-CM | POA: Diagnosis present

## 2016-02-21 DIAGNOSIS — I959 Hypotension, unspecified: Secondary | ICD-10-CM | POA: Diagnosis present

## 2016-02-21 DIAGNOSIS — E785 Hyperlipidemia, unspecified: Secondary | ICD-10-CM | POA: Diagnosis present

## 2016-02-21 DIAGNOSIS — E11628 Type 2 diabetes mellitus with other skin complications: Secondary | ICD-10-CM | POA: Diagnosis present

## 2016-02-21 DIAGNOSIS — E11610 Type 2 diabetes mellitus with diabetic neuropathic arthropathy: Secondary | ICD-10-CM | POA: Diagnosis present

## 2016-02-21 HISTORY — DX: Osteomyelitis, unspecified: M86.9

## 2016-02-21 LAB — CBC WITH DIFFERENTIAL/PLATELET
Basophils Absolute: 0.1 10*3/uL (ref 0–0.1)
EOS ABS: 0 10*3/uL (ref 0–0.7)
Eosinophils Relative: 0 %
HEMATOCRIT: 25.7 % — AB (ref 35.0–47.0)
HEMOGLOBIN: 8.3 g/dL — AB (ref 12.0–16.0)
LYMPHS ABS: 2 10*3/uL (ref 1.0–3.6)
Lymphocytes Relative: 10 %
MCH: 26.8 pg (ref 26.0–34.0)
MCHC: 32.5 g/dL (ref 32.0–36.0)
MCV: 82.4 fL (ref 80.0–100.0)
MONO ABS: 1.4 10*3/uL — AB (ref 0.2–0.9)
Neutro Abs: 16.9 10*3/uL — ABNORMAL HIGH (ref 1.4–6.5)
Platelets: 403 10*3/uL (ref 150–440)
RBC: 3.11 MIL/uL — ABNORMAL LOW (ref 3.80–5.20)
RDW: 14.9 % — AB (ref 11.5–14.5)
WBC: 20.4 10*3/uL — ABNORMAL HIGH (ref 3.6–11.0)

## 2016-02-21 LAB — COMPREHENSIVE METABOLIC PANEL
ALBUMIN: 2.9 g/dL — AB (ref 3.5–5.0)
ALK PHOS: 90 U/L (ref 38–126)
ALT: 13 U/L — AB (ref 14–54)
AST: 18 U/L (ref 15–41)
Anion gap: 8 (ref 5–15)
BUN: 13 mg/dL (ref 6–20)
CALCIUM: 8.4 mg/dL — AB (ref 8.9–10.3)
CHLORIDE: 98 mmol/L — AB (ref 101–111)
CO2: 23 mmol/L (ref 22–32)
CREATININE: 1.09 mg/dL — AB (ref 0.44–1.00)
GFR calc non Af Amer: 58 mL/min — ABNORMAL LOW (ref >60)
GLUCOSE: 235 mg/dL — AB (ref 65–99)
Potassium: 3.8 mmol/L (ref 3.5–5.1)
SODIUM: 129 mmol/L — AB (ref 135–145)
Total Bilirubin: 1.2 mg/dL (ref 0.3–1.2)
Total Protein: 7.7 g/dL (ref 6.5–8.1)

## 2016-02-21 LAB — LACTIC ACID, PLASMA
LACTIC ACID, VENOUS: 1.5 mmol/L (ref 0.5–2.0)
Lactic Acid, Venous: 1.6 mmol/L (ref 0.5–2.0)

## 2016-02-21 LAB — SEDIMENTATION RATE: Sed Rate: 127 mm/hr — ABNORMAL HIGH (ref 0–30)

## 2016-02-21 LAB — GLUCOSE, CAPILLARY
GLUCOSE-CAPILLARY: 249 mg/dL — AB (ref 65–99)
Glucose-Capillary: 208 mg/dL — ABNORMAL HIGH (ref 65–99)

## 2016-02-21 MED ORDER — SODIUM CHLORIDE 0.9 % IV SOLN
Freq: Once | INTRAVENOUS | Status: AC
  Administered 2016-02-21: 14:00:00 via INTRAVENOUS

## 2016-02-21 MED ORDER — INSULIN ASPART 100 UNIT/ML ~~LOC~~ SOLN
0.0000 [IU] | Freq: Three times a day (TID) | SUBCUTANEOUS | Status: DC
  Administered 2016-02-22: 2 [IU] via SUBCUTANEOUS
  Administered 2016-02-23: 3 [IU] via SUBCUTANEOUS
  Administered 2016-02-24 – 2016-02-25 (×3): 2 [IU] via SUBCUTANEOUS
  Administered 2016-02-26: 1 [IU] via SUBCUTANEOUS
  Filled 2016-02-21: qty 1
  Filled 2016-02-21 (×3): qty 2
  Filled 2016-02-21 (×2): qty 3
  Filled 2016-02-21: qty 1
  Filled 2016-02-21: qty 2
  Filled 2016-02-21: qty 3

## 2016-02-21 MED ORDER — METFORMIN HCL 500 MG PO TABS
1000.0000 mg | ORAL_TABLET | Freq: Two times a day (BID) | ORAL | Status: DC
  Administered 2016-02-23 – 2016-02-26 (×7): 1000 mg via ORAL
  Filled 2016-02-21 (×8): qty 2

## 2016-02-21 MED ORDER — IBUPROFEN 400 MG PO TABS
400.0000 mg | ORAL_TABLET | Freq: Four times a day (QID) | ORAL | Status: DC | PRN
  Administered 2016-02-21 – 2016-02-22 (×3): 400 mg via ORAL
  Filled 2016-02-21 (×3): qty 1

## 2016-02-21 MED ORDER — GABAPENTIN 400 MG PO CAPS
800.0000 mg | ORAL_CAPSULE | Freq: Every morning | ORAL | Status: DC
  Administered 2016-02-23 – 2016-02-25 (×3): 800 mg via ORAL
  Filled 2016-02-21 (×5): qty 2

## 2016-02-21 MED ORDER — LORAZEPAM 2 MG/ML IJ SOLN
1.0000 mg | Freq: Once | INTRAMUSCULAR | Status: AC
  Administered 2016-02-21: 1 mg via INTRAVENOUS

## 2016-02-21 MED ORDER — INSULIN ASPART 100 UNIT/ML FLEXPEN: 7.0000 [IU] | PEN_INJECTOR | Freq: Three times a day (TID) | SUBCUTANEOUS | Status: DC

## 2016-02-21 MED ORDER — GABAPENTIN 400 MG PO CAPS
1600.0000 mg | ORAL_CAPSULE | Freq: Every day | ORAL | Status: DC
  Administered 2016-02-21 – 2016-02-25 (×5): 1600 mg via ORAL
  Filled 2016-02-21 (×4): qty 4
  Filled 2016-02-21: qty 2

## 2016-02-21 MED ORDER — ACETAMINOPHEN 325 MG PO TABS
650.0000 mg | ORAL_TABLET | Freq: Once | ORAL | Status: AC
  Administered 2016-02-21: 650 mg via ORAL
  Filled 2016-02-21: qty 2

## 2016-02-21 MED ORDER — VANCOMYCIN HCL IN DEXTROSE 1-5 GM/200ML-% IV SOLN
1000.0000 mg | Freq: Two times a day (BID) | INTRAVENOUS | Status: DC
Start: 2016-02-21 — End: 2016-02-24
  Administered 2016-02-21 – 2016-02-24 (×6): 1000 mg via INTRAVENOUS
  Filled 2016-02-21 (×7): qty 200

## 2016-02-21 MED ORDER — ACETAMINOPHEN 325 MG PO TABS
650.0000 mg | ORAL_TABLET | Freq: Four times a day (QID) | ORAL | Status: DC | PRN
  Administered 2016-02-22 – 2016-02-24 (×3): 650 mg via ORAL
  Filled 2016-02-21 (×2): qty 2

## 2016-02-21 MED ORDER — ALBUTEROL SULFATE (2.5 MG/3ML) 0.083% IN NEBU: 3.0000 mL | INHALATION_SOLUTION | Freq: Four times a day (QID) | RESPIRATORY_TRACT | Status: DC | PRN

## 2016-02-21 MED ORDER — FLUTICASONE PROPIONATE 50 MCG/ACT NA SUSP
2.0000 | Freq: Every day | NASAL | Status: DC | PRN
  Filled 2016-02-21: qty 16

## 2016-02-21 MED ORDER — VANCOMYCIN HCL IN DEXTROSE 1-5 GM/200ML-% IV SOLN
1000.0000 mg | Freq: Once | INTRAVENOUS | Status: AC
  Administered 2016-02-21: 1000 mg via INTRAVENOUS
  Filled 2016-02-21: qty 200

## 2016-02-21 MED ORDER — HEPARIN SODIUM (PORCINE) 5000 UNIT/ML IJ SOLN
5000.0000 [IU] | Freq: Three times a day (TID) | INTRAMUSCULAR | Status: DC
  Administered 2016-02-21 – 2016-02-25 (×7): 5000 [IU] via SUBCUTANEOUS
  Filled 2016-02-21 (×9): qty 1

## 2016-02-21 MED ORDER — ATORVASTATIN CALCIUM 20 MG PO TABS
20.0000 mg | ORAL_TABLET | Freq: Every day | ORAL | Status: DC
  Administered 2016-02-21 – 2016-02-25 (×5): 20 mg via ORAL
  Filled 2016-02-21 (×5): qty 1

## 2016-02-21 MED ORDER — NAPROXEN 500 MG PO TABS
500.0000 mg | ORAL_TABLET | Freq: Two times a day (BID) | ORAL | Status: DC
  Administered 2016-02-23 – 2016-02-26 (×7): 500 mg via ORAL
  Filled 2016-02-21 (×7): qty 1

## 2016-02-21 MED ORDER — PIPERACILLIN-TAZOBACTAM 3.375 G IVPB 30 MIN
3.3750 g | Freq: Once | INTRAVENOUS | Status: AC
  Administered 2016-02-21: 3.375 g via INTRAVENOUS
  Filled 2016-02-21: qty 50

## 2016-02-21 MED ORDER — VITAMIN B-12 1000 MCG PO TABS
1000.0000 ug | ORAL_TABLET | Freq: Every day | ORAL | Status: DC
  Administered 2016-02-23 – 2016-02-26 (×4): 1000 ug via ORAL
  Filled 2016-02-21 (×4): qty 1

## 2016-02-21 MED ORDER — GABAPENTIN 800 MG PO TABS: 800.0000 mg | ORAL_TABLET | Freq: Two times a day (BID) | ORAL | Status: DC

## 2016-02-21 MED ORDER — INSULIN GLARGINE 100 UNIT/ML ~~LOC~~ SOLN
20.0000 [IU] | Freq: Every day | SUBCUTANEOUS | Status: DC
  Administered 2016-02-21 – 2016-02-25 (×5): 20 [IU] via SUBCUTANEOUS
  Filled 2016-02-21 (×6): qty 0.2

## 2016-02-21 MED ORDER — LORATADINE 10 MG PO TABS
10.0000 mg | ORAL_TABLET | Freq: Every day | ORAL | Status: DC
  Administered 2016-02-23 – 2016-02-26 (×4): 10 mg via ORAL
  Filled 2016-02-21 (×4): qty 1

## 2016-02-21 MED ORDER — IBUPROFEN 400 MG PO TABS
ORAL_TABLET | ORAL | Status: AC
  Administered 2016-02-21: 400 mg via ORAL
  Filled 2016-02-21: qty 1

## 2016-02-21 MED ORDER — LORAZEPAM 2 MG/ML IJ SOLN
INTRAMUSCULAR | Status: AC
Start: 2016-02-21 — End: 2016-02-21
  Administered 2016-02-21: 1 mg via INTRAVENOUS
  Filled 2016-02-21: qty 1

## 2016-02-21 MED ORDER — PIPERACILLIN-TAZOBACTAM 3.375 G IVPB
3.3750 g | Freq: Three times a day (TID) | INTRAVENOUS | Status: DC
  Administered 2016-02-21 – 2016-02-23 (×5): 3.375 g via INTRAVENOUS
  Filled 2016-02-21 (×7): qty 50

## 2016-02-21 MED ORDER — ASPIRIN EC 81 MG PO TBEC
81.0000 mg | DELAYED_RELEASE_TABLET | Freq: Every day | ORAL | Status: DC
  Administered 2016-02-23 – 2016-02-26 (×4): 81 mg via ORAL
  Filled 2016-02-21 (×4): qty 1

## 2016-02-21 MED ORDER — AMITRIPTYLINE HCL 50 MG PO TABS
100.0000 mg | ORAL_TABLET | Freq: Every day | ORAL | Status: DC
  Administered 2016-02-21 – 2016-02-25 (×5): 100 mg via ORAL
  Filled 2016-02-21 (×6): qty 4
  Filled 2016-02-21 (×2): qty 2

## 2016-02-21 MED ORDER — INSULIN GLARGINE 100 UNITS/ML SOLOSTAR PEN
20.0000 [IU] | PEN_INJECTOR | Freq: Every day | SUBCUTANEOUS | Status: DC
  Filled 2016-02-21: qty 3

## 2016-02-21 NOTE — ED Provider Notes (Signed)
[facility] Emergency Department Provider Note   ____________________________________________  Time seen: Approximately 2:08 PM  I have reviewed the triage vital signs and the nursing notes.   HISTORY  Chief Complaint Foot Pain and Fever    HPI Jackie Miles is a 50 y.o. female patient's being treated for a wound in her foot which has been infected. Patient reports she saw the infectious disease doctor who opened the wound as they were afraid that she had stepped on something. Wound seems to got infected since. She says she's had a fever for a couple days and foot spend red and swollen as well. Patient is a history of MRSA. She has a wound in the sole of the right foot swollen area around it and redness. There is a small amount of redness coming up the ankle. Patient reports it was worse earlier today.  Past Medical History  Diagnosis Date  . Fibromyalgia   . Diabetes mellitus without complication (HCC)   . Hypertension   . Asthma   . H/O percutaneous left heart catheterization     Patient Active Problem List   Diagnosis Date Noted  . Foot infection 01/22/2016  . Sepsis (HCC) 01/12/2016    History reviewed. No pertinent past surgical history.  Current Outpatient Rx  Name  Route  Sig  Dispense  Refill  . amitriptyline (ELAVIL) 100 MG tablet   Oral   Take 100 mg by mouth at bedtime.      0   . gabapentin (NEURONTIN) 800 MG tablet   Oral   Take 800-1,600 mg by mouth 2 (two) times daily. Pt takes one tablet in the morning and two tablets at bedtime.         . insulin aspart (NOVOLOG FLEXPEN) 100 UNIT/ML FlexPen   Subcutaneous   Inject 7 Units into the skin 3 (three) times daily with meals.         . insulin glargine (LANTUS) 100 unit/mL SOPN   Subcutaneous   Inject 20 Units into the skin at bedtime.         . metFORMIN (GLUCOPHAGE) 1000 MG tablet   Oral   Take 1,000 mg by mouth 2 (two) times daily with a meal.         .  metoprolol tartrate (LOPRESSOR) 25 MG tablet   Oral   Take 12.5 mg by mouth 2 (two) times daily.          Marland Kitchen acetaminophen (TYLENOL) 325 MG tablet   Oral   Take 650 mg by mouth every 4 (four) hours as needed.         Marland Kitchen albuterol (PROAIR HFA) 108 (90 Base) MCG/ACT inhaler   Inhalation   Inhale 1 puff into the lungs every 6 (six) hours as needed.         Marland Kitchen aspirin EC 81 MG tablet   Oral   Take 81 mg by mouth daily.         Marland Kitchen atorvastatin (LIPITOR) 20 MG tablet   Oral   Take 20 mg by mouth daily.         . cetirizine (ZYRTEC) 10 MG tablet   Oral   Take 10 mg by mouth daily.         . fluticasone (FLONASE) 50 MCG/ACT nasal spray   Each Nare   Place 2 sprays into both nostrils 2 (two) times daily.         Marland Kitchen sulfamethoxazole-trimethoprim (BACTRIM DS,SEPTRA DS) 800-160 MG  tablet   Oral   Take 1 tablet by mouth 2 (two) times daily. Patient not taking: Reported on 02/21/2016   24 tablet   0     Allergies Ibuprofen  No family history on file.  Social History Social History  Substance Use Topics  . Smoking status: Current Every Day Smoker -- 0.50 packs/day  . Smokeless tobacco: None  . Alcohol Use: No    Review of Systems Constitutional:  fever Eyes: No visual changes. ENT: No sore throat. Cardiovascular: Denies chest pain. Respiratory: Denies shortness of breath. Gastrointestinal: No abdominal pain.  No nausea, no vomiting.  No diarrhea.  No constipation. Genitourinary: Negative for dysuria. Musculoskeletal: Negative for back pain. Skin: Negative for rash. Neurological: Negative for headaches, focal weakness or numbness.  10-point ROS otherwise negative.  ____________________________________________   PHYSICAL EXAM:  VITAL SIGNS: ED Triage Vitals  Enc Vitals Group     BP 02/21/16 1326 111/58 mmHg     Pulse Rate 02/21/16 1326 120     Resp 02/21/16 1326 22     Temp 02/21/16 1326 102.1 F (38.9 C)     Temp Source 02/21/16 1326 Oral     SpO2  02/21/16 1326 99 %     Weight 02/21/16 1326 192 lb (87.091 kg)     Height 02/21/16 1326  (1.727 m)     Head Cir --      Peak Flow --      Pain Score 02/21/16 1326 10     Pain Loc --      Pain Edu? --      Excl. in GC? --    Constitutional: Alert and oriented. Well appearing and in no acute distress. Eyes: Conjunctivae are normal. PERRL. EOMI. Head: Atraumatic. Nose: No congestion/rhinnorhea. Mouth/Throat: Mucous membranes are moist.  Oropharynx non-erythematous. Neck: No stridor.  Cardiovascular: Normal rate, regular rhythm. Grossly normal heart sounds.  Good peripheral circulation. Respiratory: Normal respiratory effort.  No retractions. Lungs CTAB. Gastrointestinal: Soft and nontender. No distention. No abdominal bruits. No CVA tenderness. Musculoskeletal: No lower extremity tenderness nor edema.Except for the right foot which is swollen red and has a small hole in the sole which is also swollen around it. Patient reports does not hurt. No joint effusions. Neurologic:  Normal speech and language. No gross focal neurologic deficits are appreciated. No gait instability. Skin:  Skin is warm, dry and intact. No rash noted. Psychiatric: Mood and affect are normal. Speech and behavior are normal.  ____________________________________________   LABS (all labs ordered are listed, but only abnormal results are displayed)  Labs Reviewed  COMPREHENSIVE METABOLIC PANEL - Abnormal; Notable for the following:    Sodium 129 (*)    Chloride 98 (*)    Glucose, Bld 235 (*)    Creatinine, Ser 1.09 (*)    Calcium 8.4 (*)    Albumin 2.9 (*)    ALT 13 (*)    GFR calc non Af Amer 58 (*)    All other components within normal limits  CBC WITH DIFFERENTIAL/PLATELET - Abnormal; Notable for the following:    WBC 20.4 (*)    RBC 3.11 (*)    Hemoglobin 8.3 (*)    HCT 25.7 (*)    RDW 14.9 (*)    Neutro Abs 16.9 (*)    Monocytes Absolute 1.4 (*)    All other components within normal limits    SEDIMENTATION RATE - Abnormal; Notable for the following:    Sed Rate 127 (*)  All other components within normal limits  CULTURE, BLOOD (ROUTINE X 2)  CULTURE, BLOOD (ROUTINE X 2)  URINE CULTURE  LACTIC ACID, PLASMA  LACTIC ACID, PLASMA   ____________________________________________  EKG  EKG read and interpreted by me shows sinus tach at a rate of 119 normal axis no acute ST-T wave changes. His reading left atrial enlargement consider biatrial enlargement P waves very big and this appears to be correct. ____________________________________________  RADIOLOGY  X-ray shows air in the soft tissues foot around the fourth and fifth metatarsals. ____________________________________________   PROCEDURES    Critical Care performed: One half hour critical care time  .   ____________________________________________   INITIAL IMPRESSION / ASSESSMENT AND PLAN / ED COURSE  Pertinent labs & imaging results that were available during my care of the patient were reviewed by me and considered in my medical decision making (see chart for details). Discussed with Dr. Graciela HusbandsKlein podiatry. He wishes patient be kept nothing by mouth he may do surgery on her tonight. Hospitalist is aware will admit.  ____________________________________________   FINAL CLINICAL IMPRESSION(S) / ED DIAGNOSES  Final diagnoses:  Sepsis, due to unspecified organism (HCC)  Diabetic foot infection (HCC)      NEW MEDICATIONS STARTED DURING THIS VISIT:  New Prescriptions   No medications on file     Note:  This document was prepared using Dragon voice recognition software and may include unintentional dictation errors.    Arnaldo NatalPaul F Marcanthony Sleight, MD 02/21/16 [phone]

## 2016-02-21 NOTE — Progress Notes (Signed)
Pharmacy Antibiotic Note  Jackie ArgyleCathy Miles Miles is a 50 y.o. female admitted on 02/21/2016 with sepsis and osteomyelitis .  Pharmacy has been consulted for vancomycin and Zosyn dosing.  Ke: 0.063 Vd: 51.2 t1/2: 11   Plan: Vancomycin 1000 IV every 12 hours with stacked dosing and a trough with the 5th total dose.  Goal trough 15-20 mcg/mL. Zosyn 3.375 g iv once then Zosyn 3.375g IV q8h (4 hour infusion) beginning 6 hours after initial dose.  Height: 5\' 8"  (172.7 cm) Weight: 192 lb (87.091 kg) IBW/kg (Calculated) : 63.9  Temp (24hrs), Avg:102.5 F (39.2 C), Min:102.1 F (38.9 C), Max:102.8 F (39.3 C)   Recent Labs Lab 02/21/16 1329  WBC 20.4*  CREATININE 1.09*    Estimated Creatinine Clearance: 70.6 mL/min (by C-G formula based on Cr of 1.09).    Allergies  Allergen Reactions  . Ibuprofen Other (See Comments)    Reaction:  Acid reflux     Antimicrobials this admission: Vancomycin 5/24  >>  Zosyn 5/24  >>   Dose adjustments this admission:   Microbiology results: 5/24 BCx: pending 5/24 UCx: pending   Thank you for allowing pharmacy to be a part of this patient's care.  Jackie HartChristy, Jackie Miles 02/21/2016 5:03 PM

## 2016-02-21 NOTE — H&P (Signed)
[employer] - [place] [facility]   PATIENT NAME: Jackie Miles    MR#:  [id number]  DATE OF BIRTH:  [birth year]  DATE OF ADMISSION:  02/21/2016  PRIMARY CARE PHYSICIAN: [facility]   REQUESTING/REFERRING PHYSICIAN: Malinda  CHIEF COMPLAINT:   Chief Complaint  Patient presents with  . Foot Pain  . Fever    HISTORY OF PRESENT ILLNESS: Jackie Miles  is a 50 y.o. female with a known history of Fibromyalgia, diabetes, hypertension, asthma, cardiac catheterization- was admitted to hospital on 24th April- 2017, with right foot infection and possible osteomyelitis, and signed out against medical advise the same day because she wanted to smoke. Since then she was wrapping up her foot every night and claims to be doing fine, but for last 3-4 days her foot started getting more and more swollen and she started having fever now so came back to emergency room. On x-ray of the right foot that was noted to have some soft tissue air and fluid collection- so started on IV antibiotic, ER physician spoke to podiatry Dr. and gave his admission to hospitalist team.  PAST MEDICAL HISTORY:   Past Medical History  Diagnosis Date  . Fibromyalgia   . Diabetes mellitus without complication (HCC)   . Hypertension   . Asthma   . H/O percutaneous left heart catheterization     PAST SURGICAL HISTORY: History reviewed. No pertinent past surgical history.  SOCIAL HISTORY:  Social History  Substance Use Topics  . Smoking status: Current Every Day Smoker -- 0.50 packs/day  . Smokeless tobacco: Not on file  . Alcohol Use: No    FAMILY HISTORY:  Family History  Problem Relation Age of Onset  . Diabetes Mother   . Diabetes Sister     DRUG ALLERGIES:  Allergies  Allergen Reactions  . Ibuprofen Other (See Comments)    Reaction:  Acid reflux     REVIEW OF SYSTEMS:   CONSTITUTIONAL: Positive for fever, no fatigue or weakness.  EYES: No blurred or double vision.  EARS,  NOSE, AND THROAT: No tinnitus or ear pain.  RESPIRATORY: No cough, shortness of breath, wheezing or hemoptysis.  CARDIOVASCULAR: No chest pain, orthopnea, edema.  GASTROINTESTINAL: No nausea, vomiting, diarrhea or abdominal pain.  GENITOURINARY: No dysuria, hematuria.  ENDOCRINE: No polyuria, nocturia,  HEMATOLOGY: No anemia, easy bruising or bleeding SKIN: No rash or lesion. MUSCULOSKELETAL: No joint pain or arthritis.  Right foot swelling and pain.Marland Kitchen NEUROLOGIC: No tingling, numbness, weakness.  PSYCHIATRY: No anxiety or depression.   MEDICATIONS AT HOME:  Prior to Admission medications   Medication Sig Start Date End Date Taking? Authorizing Provider  albuterol (PROVENTIL HFA;VENTOLIN HFA) 108 (90 Base) MCG/ACT inhaler Inhale 2 puffs into the lungs every 6 (six) hours as needed for wheezing or shortness of breath.   Yes Historical Provider, MD  amitriptyline (ELAVIL) 100 MG tablet Take 100 mg by mouth at bedtime.   Yes Historical Provider, MD  aspirin EC 81 MG tablet Take 81 mg by mouth daily.   Yes Historical Provider, MD  atorvastatin (LIPITOR) 20 MG tablet Take 20 mg by mouth at bedtime.    Yes Historical Provider, MD  cetirizine (ZYRTEC) 10 MG tablet Take 10 mg by mouth at bedtime.    Yes Historical Provider, MD  fluticasone (FLONASE) 50 MCG/ACT nasal spray Place 2 sprays into both nostrils daily as needed for rhinitis.    Yes Historical Provider, MD  gabapentin (NEURONTIN) 800 MG tablet Take 800-1,600 mg  by mouth 2 (two) times daily. Pt takes one tablet in the morning and two tablets at bedtime.   Yes Historical Provider, MD  insulin aspart (NOVOLOG FLEXPEN) 100 UNIT/ML FlexPen Inject 7 Units into the skin 3 (three) times daily with meals.   Yes Historical Provider, MD  insulin glargine (LANTUS) 100 unit/mL SOPN Inject 20 Units into the skin at bedtime.   Yes Historical Provider, MD  metFORMIN (GLUCOPHAGE) 1000 MG tablet Take 1,000 mg by mouth 2 (two) times daily with a meal.   Yes  Historical Provider, MD  metoprolol tartrate (LOPRESSOR) 25 MG tablet Take 12.5 mg by mouth 2 (two) times daily.    Yes Historical Provider, MD  naproxen sodium (ANAPROX) 220 MG tablet Take 220 mg by mouth 2 (two) times daily with a meal.   Yes Historical Provider, MD  vitamin B-12 (CYANOCOBALAMIN) 1000 MCG tablet Take 1,000 mcg by mouth daily.   Yes Historical Provider, MD  sulfamethoxazole-trimethoprim (BACTRIM DS,SEPTRA DS) 800-160 MG tablet Take 1 tablet by mouth 2 (two) times daily. Patient not taking: Reported on 02/21/2016 01/14/16   Shaune Pollack, MD      PHYSICAL EXAMINATION:   VITAL SIGNS: Blood pressure 111/58, pulse 120, temperature 102.1 F (38.9 C), temperature source Oral, resp. rate 22, height 5\' 8"  (1.727 m), weight 87.091 kg (192 lb), SpO2 99 %.  GENERAL:  50 y.o.-year-old patient lying in the bed with no acute distress.  EYES: Pupils equal, round, reactive to light and accommodation. No scleral icterus. Extraocular muscles intact.  HEENT: Head atraumatic, normocephalic. Oropharynx and nasopharynx clear.  NECK:  Supple, no jugular venous distention. No thyroid enlargement, no tenderness.  LUNGS: Normal breath sounds bilaterally, no wheezing, rales,rhonchi or crepitation. No use of accessory muscles of respiration.  CARDIOVASCULAR: S1, S2 normal. No murmurs, rubs, or gallops.  ABDOMEN: Soft, nontender, nondistended. Bowel sounds present. No organomegaly or mass.  EXTREMITIES: No pedal edema, cyanosis, or clubbing. Right foot is swollen with some skin breakdown at the bottom of the foot, redness and warm skin to touch, some darkening on the color of the skin on her fourth and fifth toes. NEUROLOGIC: Cranial nerves II through XII are intact. Muscle strength 5/5 in all extremities. Sensation intact. Gait not checked.  PSYCHIATRIC: The patient is alert and oriented x 3.  SKIN: No obvious rash, lesion, or ulcer.   LABORATORY PANEL:   CBC  Recent Labs Lab 02/21/16 1329  WBC  20.4*  HGB 8.3*  HCT 25.7*  PLT 403  MCV 82.4  MCH 26.8  MCHC 32.5  RDW 14.9*  LYMPHSABS 2.0  MONOABS 1.4*  EOSABS 0.0  BASOSABS 0.1   ------------------------------------------------------------------------------------------------------------------  Chemistries   Recent Labs Lab 02/21/16 1329  NA 129*  K 3.8  CL 98*  CO2 23  GLUCOSE 235*  BUN 13  CREATININE 1.09*  CALCIUM 8.4*  AST 18  ALT 13*  ALKPHOS 90  BILITOT 1.2   ------------------------------------------------------------------------------------------------------------------ estimated creatinine clearance is 70.6 mL/min (by C-G formula based on Cr of 1.09). ------------------------------------------------------------------------------------------------------------------ No results for input(s): TSH, T4TOTAL, T3FREE, THYROIDAB in the last 72 hours.  Invalid input(s): FREET3   Coagulation profile No results for input(s): INR, PROTIME in the last 168 hours. ------------------------------------------------------------------------------------------------------------------- No results for input(s): DDIMER in the last 72 hours. -------------------------------------------------------------------------------------------------------------------  Cardiac Enzymes No results for input(s): CKMB, TROPONINI, MYOGLOBIN in the last 168 hours.  Invalid input(s): CK ------------------------------------------------------------------------------------------------------------------ Invalid input(s): POCBNP  ---------------------------------------------------------------------------------------------------------------  Urinalysis    Component Value Date/Time   COLORURINE STRAW*  01/22/2016 1755   APPEARANCEUR CLEAR* 01/22/2016 1755   LABSPEC 1.003* 01/22/2016 1755   PHURINE 6.0 01/22/2016 1755   GLUCOSEU NEGATIVE 01/22/2016 1755   HGBUR NEGATIVE 01/22/2016 1755   BILIRUBINUR NEGATIVE 01/22/2016 1755   KETONESUR  NEGATIVE 01/22/2016 1755   PROTEINUR NEGATIVE 01/22/2016 1755   NITRITE NEGATIVE 01/22/2016 1755   LEUKOCYTESUR NEGATIVE 01/22/2016 1755     RADIOLOGY: Dg Foot Complete Right  02/21/2016  CLINICAL DATA:  Right foot pain, fever EXAM: RIGHT FOOT COMPLETE - 3+ VIEW COMPARISON:  01/22/2016 FINDINGS: Three views of the right foot submitted. 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 Again noted hypertrophic bony changes at tarsal metatarsal junction. Again noted soft tissue wound mid inferior plantar aspect. There is diffuse soft tissue swelling metatarsal region. There is soft tissue air in the region of fourth and fifth metatarsal phalangeal joint inferior aspect best seen on lateral view. Soft tissue infection or fasciitis cannot be excluded. Clinical correlation is necessary. No definite evidence of osteomyelitis. Further evaluation with MRI could be performed as clinically warranted. 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 Again noted hypertrophic bony changes at tarsal metatarsal junction. Again noted soft tissue wound mid inferior plantar aspect. There is diffuse soft tissue swelling metatarsal region. There is soft tissue air in the region of fourth and fifth metatarsal phalangeal joint inferior aspect best seen on lateral view. Soft tissue infection or fasciitis cannot be excluded. Clinical correlation is necessary. No definite evidence of osteomyelitis. Further evaluation with MRI could be performed as clinically warranted. Electronically Signed   By: Natasha Mead M.D.   On: 02/21/2016 15:03    EKG: Orders placed or performed during the hospital encounter of 02/21/16  . ED EKG  . ED EKG  . EKG 12-Lead  . EKG 12-Lead  . EKG 12-Lead  . EKG 12-Lead    IMPRESSION AND PLAN:  * Sepsis due to osteomyelitis   Patient is septic currently with fever, tachycardia,  elevated white cell count.   ER physician ordered Vanco and Zosyn, I will continue that for now.   Podiatry consult and possible amputation.  * Diabetes   Currently I'll continue her basal and with meal insulin, and do a fingerstick to check her glucose control.  * Hypertension   Because of sepsis her blood pressure is running in normal range currently so I would like to hold metoprolol.  * Chronic anemia  - Likely due to chronic disease and OM.   Monitor.  * Diabetes neuropathy   Continue gabapentin.  * Hyperlipidemia   Continue atorvastatin.  * Smoking   Counseled to quit smoking for 4 minutes and offered nicotine patch.   All the records are reviewed and case discussed with ED provider. Management plans discussed with the patient, family and they are in agreement.  CODE STATUS: Full code Code Status History    Date Active Date Inactive Code Status Order ID Comments User Context   01/22/2016  6:13 PM 01/22/2016 11:49 PM Full Code [id number]  Auburn Bilberry, MD ED   01/12/2016  3:28 AM 01/14/2016  2:43 PM Full Code [id number]  Enedina Finner, MD ED       TOTAL TIME TAKING CARE OF THIS PATIENT: 50 minutes.    Altamese Dilling M.D on 02/21/2016   Between 7am to 6pm - Pager - [phone]  After 6pm go to [email or web address] - password [email or web address]  [employer]  Office  [phone]  CC: [facility] physician; [facility]   Note: This dictation was prepared with Dragon dictation along with smaller phrase technology. Any transcriptional errors that result from this process are unintentional.

## 2016-02-21 NOTE — ED Notes (Signed)
Pt presents with foot pain in right foot. She states that she had her foot worked on to prevent/treat infection. Pt states that the wound on her foot is getting red and that she has been running a fever since Friday. She was having fever then, but food wasn't red. Pt now has painful, red foot and fever.

## 2016-02-21 NOTE — Consult Note (Signed)
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 female.  HPI: The patient relates a chronic history of an ulceration beneath her right midfoot. She has been seen at the wound care center down at [facility] in the past area also is been admitted a couple of times over the last couple of months. She has left on multiple occasions AMA. States that she had been taking 2 antibiotics around out of these last week. Recently over the last 2 days started to develop some fever and chills with increased redness and swelling in the right foot. She did present to the emergency department today where she was admitted after gas was found to be in the soft tissues.  Past Medical History  Diagnosis Date  . Fibromyalgia   . Diabetes mellitus without complication ([place])   . Hypertension   . Asthma   . H/O percutaneous left heart catheterization     History reviewed. No pertinent past surgical history.  Family History  Problem Relation Age of Onset  . Diabetes Mother   . Diabetes Sister     Social History:  reports that she has been smoking.  She does not have any smokeless tobacco history on file. She reports that she does not drink alcohol. Her drug history is not on file.  Allergies:  Allergies  Allergen Reactions  . Ibuprofen Other (See Comments)    Reaction:  Acid reflux     Medications:  Scheduled: . amitriptyline  100 mg Oral QHS  . aspirin EC  81 mg Oral Daily  . atorvastatin  20 mg Oral QHS  . [START ON 02/22/2016] gabapentin  800 mg Oral q morning - 10a   And  . gabapentin  1,600 mg Oral QHS  . heparin  5,000 Units Subcutaneous Q8H  . [START ON 02/22/2016] insulin aspart  7 Units Subcutaneous TID WC  . insulin aspart  0-9 Units Subcutaneous TID WC  . insulin glargine  20 Units Subcutaneous QHS  . loratadine  10 mg Oral Daily  . [START ON 02/22/2016] metFORMIN  1,000 mg Oral BID  WC  . [START ON 02/22/2016] naproxen  500 mg Oral BID WC  . piperacillin-tazobactam (ZOSYN)  IV  3.375 g Intravenous Q8H  . vancomycin  1,000 mg Intravenous Q12H  . vitamin B-12  1,000 mcg Oral Daily    Results for orders placed or performed during the hospital encounter of 02/21/16 (from the past 48 hour(s))  Comprehensive metabolic panel     Status: Abnormal   Collection Time: 02/21/16  1:29 PM  Result Value Ref Range   Sodium 129 (L) 135 - 145 mmol/L   Potassium 3.8 3.5 - 5.1 mmol/L   Chloride 98 (L) 101 - 111 mmol/L   CO2 23 22 - 32 mmol/L   Glucose, Bld 235 (H) 65 - 99 mg/dL   BUN 13 6 - 20 mg/dL   Creatinine, Ser 1.09 (H) 0.44 - 1.00 mg/dL   Calcium 8.4 (L) 8.9 - 10.3 mg/dL   Total Protein 7.7 6.5 - 8.1 g/dL   Albumin 2.9 (L) 3.5 - 5.0 g/dL   AST 18 15 - 41 U/L   ALT 13 (L) 14 - 54 U/L   Alkaline Phosphatase 90 38 - 126 U/L   Total Bilirubin 1.2 0.3 - 1.2 mg/dL   GFR calc non Af Amer 58 (L) >60 mL/min   GFR  calc Af Amer >60 >60 mL/min    Comment: (NOTE) The eGFR has been calculated using the CKD EPI equation. This calculation has not been validated in all clinical situations. eGFR's persistently <60 mL/min signify possible Chronic Kidney Disease.    Anion gap 8 5 - 15  CBC WITH DIFFERENTIAL     Status: Abnormal   Collection Time: 02/21/16  1:29 PM  Result Value Ref Range   WBC 20.4 (H) 3.6 - 11.0 K/uL   RBC 3.11 (L) 3.80 - 5.20 MIL/uL   Hemoglobin 8.3 (L) 12.0 - 16.0 g/dL   HCT 25.7 (L) 35.0 - 47.0 %   MCV 82.4 80.0 - 100.0 fL   MCH 26.8 26.0 - 34.0 pg   MCHC 32.5 32.0 - 36.0 g/dL   RDW 14.9 (H) 11.5 - 14.5 %   Platelets 403 150 - 440 K/uL   Neutrophils Relative % 83% %   Neutro Abs 16.9 (H) 1.4 - 6.5 K/uL   Lymphocytes Relative 10% %   Lymphs Abs 2.0 1.0 - 3.6 K/uL   Monocytes Relative 7% %   Monocytes Absolute 1.4 (H) 0.2 - 0.9 K/uL   Eosinophils Relative 0% %   Eosinophils Absolute 0.0 0 - 0.7 K/uL   Basophils Relative 0% %   Basophils Absolute 0.1 0 -  0.1 K/uL  Sedimentation rate     Status: Abnormal   Collection Time: 02/21/16  1:29 PM  Result Value Ref Range   Sed Rate 127 (H) 0 - 30 mm/hr    Dg Foot Complete Right  02/21/2016  CLINICAL DATA:  Right foot pain, fever EXAM: RIGHT FOOT COMPLETE - 3+ VIEW COMPARISON:  01/22/2016 FINDINGS: Three views of the right foot submitted. 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 Again noted hypertrophic bony changes at tarsal metatarsal junction. Again noted soft tissue wound mid inferior plantar aspect. There is diffuse soft tissue swelling metatarsal region. There is soft tissue air in the region of fourth and fifth metatarsal phalangeal joint inferior aspect best seen on lateral view. Soft tissue infection or fasciitis cannot be excluded. Clinical correlation is necessary. No definite evidence of osteomyelitis. Further evaluation with MRI could be performed as clinically warranted. 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 Again noted hypertrophic bony changes at tarsal metatarsal junction. Again noted soft tissue wound mid inferior plantar aspect. There is diffuse soft tissue swelling metatarsal region. There is soft tissue air in the region of fourth and fifth metatarsal phalangeal joint inferior aspect best seen on lateral view. Soft tissue infection or fasciitis cannot be excluded. Clinical correlation is necessary. No definite evidence of osteomyelitis. Further evaluation with MRI could be performed as clinically warranted. Electronically Signed   By: Lahoma Crocker M.D.   On: 02/21/2016 15:03    Review of Systems  Constitutional: Positive for fever.  HENT: Negative.   Eyes: Negative.   Respiratory: Negative.   Cardiovascular: Negative.   Gastrointestinal: Negative.   Genitourinary: Negative.   Musculoskeletal: Negative.   Skin:       Patient relates some significant  swelling in the right foot with some recent redness. 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  Endo/Heme/Allergies: Negative.   Psychiatric/Behavioral: Negative.    Blood pressure 103/50, pulse 103, temperature 100 F (37.8 C), temperature source Oral, resp. rate 16, height '5\' 8"'$  (1.727  m), weight 87.091 kg (192 lb), SpO2 95 %. Physical Exam  Cardiovascular:  DP and PT pulses are palpable but diminished. Capillary filling time is intact.  Musculoskeletal:  Chronic Charcot foot deformity is noted in the right foot. Otherwise adequate range of motion and muscle strength  Neurological:  Loss of protective threshold monofilament wire in the feet bilateral.  Skin:  Significant edema is noted in the entire right foot. Some erythema is noted dorsally over the fourth and fifth metatarsal area. Chronic full-thickness ulceration is noted beneath the plantar lateral aspect of the right mid foot measuring approximately 1 cm diameter. Some plantar discoloration and skin slough is also noted in the arch area.    Assessment/Plan: Assessment: 1. Chronic full-thickness ulceration right foot. 2. Abscess with radiographic gas in the tissues right foot. 3. Diabetes with associated neuropathy.  Plan: Light dressing was applied to the right foot. Discussed with the patient that with the extent of her infection at this point she will at the very least need an I&D of the right foot. Discussed obtaining an MRI for better evaluation of the extent of the infection and abscess in the foot. Also discussed with the patient that she is at a significant risk for lower extremity amputation due to the Charcot deformity and the current infection. Plan for MRI tomorrow. Patient will be nothing by mouth after breakfast tomorrow. We will obtain a consent for I&D of the right foot  Durward Fortes 02/21/2016, 6:51 PM

## 2016-02-22 ENCOUNTER — Encounter
Admission: EM | Discharge status: Against Medical Advice | Payer: Self-pay | Source: Home / Self Care | Attending: Internal Medicine

## 2016-02-22 ENCOUNTER — Inpatient Hospital Stay: Payer: Medicaid Other

## 2016-02-22 LAB — BASIC METABOLIC PANEL
ANION GAP: 7 (ref 5–15)
BUN: 13 mg/dL (ref 6–20)
CHLORIDE: 102 mmol/L (ref 101–111)
CO2: 21 mmol/L — ABNORMAL LOW (ref 22–32)
Calcium: 8.1 mg/dL — ABNORMAL LOW (ref 8.9–10.3)
Creatinine, Ser: 0.95 mg/dL (ref 0.44–1.00)
GFR calc Af Amer: >60 mL/min (ref >60)
GFR calc non Af Amer: >60 mL/min (ref >60)
GLUCOSE: 226 mg/dL — AB (ref 65–99)
POTASSIUM: 3.8 mmol/L (ref 3.5–5.1)
Sodium: 130 mmol/L — ABNORMAL LOW (ref 135–145)

## 2016-02-22 LAB — GLUCOSE, CAPILLARY
GLUCOSE-CAPILLARY: 154 mg/dL — AB (ref 65–99)
GLUCOSE-CAPILLARY: 145 mg/dL — AB (ref 65–99)
Glucose-Capillary: 233 mg/dL — ABNORMAL HIGH (ref 65–99)
Glucose-Capillary: 142 mg/dL — ABNORMAL HIGH (ref 65–99)

## 2016-02-22 LAB — CBC
HEMATOCRIT: 21.9 % — AB (ref 35.0–47.0)
HEMOGLOBIN: 7.2 g/dL — AB (ref 12.0–16.0)
MCH: 26.5 pg (ref 26.0–34.0)
MCHC: 32.8 g/dL (ref 32.0–36.0)
MCV: 80.8 fL (ref 80.0–100.0)
Platelets: 329 10*3/uL (ref 150–440)
RBC: 2.71 MIL/uL — ABNORMAL LOW (ref 3.80–5.20)
RDW: 14.9 % — AB (ref 11.5–14.5)
WBC: 15.6 10*3/uL — AB (ref 3.6–11.0)

## 2016-02-22 SURGERY — IRRIGATION AND DEBRIDEMENT FOOT
Anesthesia: Choice | Laterality: Right

## 2016-02-22 MED ORDER — GADOBENATE DIMEGLUMINE 529 MG/ML IV SOLN
20.0000 mL | Freq: Once | INTRAVENOUS | Status: AC | PRN
  Administered 2016-02-22: 18 mL via INTRAVENOUS

## 2016-02-22 MED ORDER — SODIUM CHLORIDE 0.9 % IV SOLN
INTRAVENOUS | Status: DC
  Administered 2016-02-22: 15:00:00 via INTRAVENOUS

## 2016-02-22 MED ORDER — INSULIN ASPART 100 UNIT/ML ~~LOC~~ SOLN
7.0000 [IU] | Freq: Three times a day (TID) | SUBCUTANEOUS | Status: DC
  Administered 2016-02-22 – 2016-02-23 (×3): 7 [IU] via SUBCUTANEOUS
  Filled 2016-02-22 (×3): qty 7

## 2016-02-22 SURGICAL SUPPLY — 43 items
BANDAGE ELASTIC 4 LF NS (GAUZE/BANDAGES/DRESSINGS) IMPLANT
BLADE OSCILLATING/SAGITTAL (BLADE)
BLADE SURG 15 STRL LF DISP TIS (BLADE) IMPLANT
BLADE SURG 15 STRL SS (BLADE)
BLADE SW THK.38XMED LNG THN (BLADE) IMPLANT
BNDG ESMARK 4X12 TAN STRL LF (GAUZE/BANDAGES/DRESSINGS) IMPLANT
BNDG GAUZE 4.5X4.1 6PLY STRL (MISCELLANEOUS) IMPLANT
CANISTER SUCT 1200ML W/VALVE (MISCELLANEOUS) IMPLANT
CUFF TOURN 18 STER (MISCELLANEOUS) IMPLANT
CUFF TOURN DUAL PL 12 NO SLV (MISCELLANEOUS) IMPLANT
DRAPE FLUOR MINI C-ARM 54X84 (DRAPES) IMPLANT
DURAPREP 26ML APPLICATOR (WOUND CARE) IMPLANT
ELECT REM PT RETURN 9FT ADLT (ELECTROSURGICAL)
ELECTRODE REM PT RTRN 9FT ADLT (ELECTROSURGICAL) IMPLANT
GAUZE PETRO XEROFOAM 1X8 (MISCELLANEOUS) IMPLANT
GAUZE SPONGE 4X4 12PLY STRL (GAUZE/BANDAGES/DRESSINGS) IMPLANT
GAUZE STRETCH 2X75IN STRL (MISCELLANEOUS) IMPLANT
GLOVE BIO SURGEON STRL SZ7.5 (GLOVE) IMPLANT
GLOVE INDICATOR 8.0 STRL GRN (GLOVE) IMPLANT
GOWN STRL REUS W/ TWL LRG LVL3 (GOWN DISPOSABLE) IMPLANT
GOWN STRL REUS W/TWL LRG LVL3 (GOWN DISPOSABLE)
HANDPIECE VERSAJET DEBRIDEMENT (MISCELLANEOUS) IMPLANT
LABEL OR SOLS (LABEL) IMPLANT
NEEDLE FILTER BLUNT 18X 1/2SAF (NEEDLE)
NEEDLE FILTER BLUNT 18X1 1/2 (NEEDLE) IMPLANT
NEEDLE HYPO 25X1 1.5 SAFETY (NEEDLE) IMPLANT
NS IRRIG 500ML POUR BTL (IV SOLUTION) IMPLANT
PACK EXTREMITY ARMC (MISCELLANEOUS) IMPLANT
PAD ABD DERMACEA PRESS 5X9 (GAUZE/BANDAGES/DRESSINGS) IMPLANT
PENCIL ELECTRO HAND CTR (MISCELLANEOUS) IMPLANT
RASP SM TEAR CROSS CUT (RASP) IMPLANT
SOL PREP PVP 2OZ (MISCELLANEOUS)
SOLUTION PREP PVP 2OZ (MISCELLANEOUS) IMPLANT
STOCKINETTE STRL 4IN 9604848 (GAUZE/BANDAGES/DRESSINGS) IMPLANT
STOCKINETTE STRL 6IN 960660 (GAUZE/BANDAGES/DRESSINGS) IMPLANT
SUT ETHILON 4-0 (SUTURE)
SUT ETHILON 4-0 FS2 18XMFL BLK (SUTURE)
SUT VIC AB 3-0 SH 27 (SUTURE)
SUT VIC AB 3-0 SH 27X BRD (SUTURE) IMPLANT
SUT VIC AB 4-0 FS2 27 (SUTURE) IMPLANT
SUTURE ETHLN 4-0 FS2 18XMF BLK (SUTURE) IMPLANT
SYR 3ML LL SCALE MARK (SYRINGE) IMPLANT
SYRINGE 10CC LL (SYRINGE) IMPLANT

## 2016-02-22 NOTE — Care Management (Signed)
Attempted to meet with patient but she kept falling asleep. She states she can drive but "her daddy has been taking her to her appointments". I later learned that her father is deceased. [facility]  [phone]) [phone]. She was last seen there on Friday May 19th. Next appointment made for June 6 at 3:00PM- placed on discharge follow up. She states she has a 4 wheeled walker but doesn't use it.

## 2016-02-22 NOTE — Progress Notes (Signed)
Went in room to discuss pt condition x 2 nurses. Explained to pt she was getting septic and her her condition was deteriorating.Pt states she wants  no treatment to her foot.  Pt has stated that she wants to be DNR. She states " I thought I was DNR alreadySolicitor"  Charge Nurse and myself were in room when she expressed this desires. Dr Elpidio AnisSudini was notified.

## 2016-02-22 NOTE — Progress Notes (Signed)
Patient refusing surgery, Dr. Alberteen Spindleline gave verbal order for diet. No other complaints at this time. Jackie KusterBrandi R Miles

## 2016-02-22 NOTE — Progress Notes (Signed)
[facility] - [place] at [facility]   PATIENT NAME: Jackie Miles    MR#:  [id number]  DATE OF BIRTH:  [birth year]  SUBJECTIVE:  CHIEF COMPLAINT:   Chief Complaint  Patient presents with  . Foot Pain  . Fever   Continues to be febrile. Right foot pain. Refusing surgery.  REVIEW OF SYSTEMS:    Review of Systems  Constitutional: Positive for fever, chills and malaise/fatigue.  HENT: Negative for sore throat.   Eyes: Negative for blurred vision, double vision and pain.  Respiratory: Negative for cough, hemoptysis, shortness of breath and wheezing.   Cardiovascular: Negative for chest pain, palpitations, orthopnea and leg swelling.  Gastrointestinal: Negative for heartburn, nausea, vomiting, abdominal pain, diarrhea and constipation.  Genitourinary: Negative for dysuria and hematuria.  Musculoskeletal: Negative for back pain and joint pain.  Skin: Negative for rash.  Neurological: Positive for weakness. Negative for sensory change, speech change, focal weakness and headaches.  Endo/Heme/Allergies: Does not bruise/bleed easily.  Psychiatric/Behavioral: Negative for depression. The patient is not nervous/anxious.     DRUG ALLERGIES:   Allergies  Allergen Reactions  . Ibuprofen Other (See Comments)    Reaction:  Acid reflux     VITALS:  Blood pressure 110/47, pulse 110, temperature 99.5 F (37.5 C), temperature source Oral, resp. rate 18, height  (1.727 m), weight 87.091 kg (192 lb), SpO2 100 %.  PHYSICAL EXAMINATION:   Physical Exam  GENERAL:  50 y.o.-year-old patient lying in the bed with no acute distress.  EYES: Pupils equal, round, reactive to light and accommodation. No scleral icterus. Extraocular muscles intact.  HEENT: Head atraumatic, normocephalic. Oropharynx and nasopharynx clear.  NECK:  Supple, no jugular venous distention. No thyroid enlargement, no tenderness.  LUNGS: Normal breath sounds bilaterally, no wheezing, rales,  rhonchi. No use of accessory muscles of respiration.  CARDIOVASCULAR: S1, S2 normal. No murmurs, rubs, or gallops.  ABDOMEN: Soft, nontender, nondistended. Bowel sounds present. No organomegaly or mass.  EXTREMITIES: No cyanosis, clubbing or edema b/l.    NEUROLOGIC: Cranial nerves II through XII are intact. No focal Motor or sensory deficits b/l.   PSYCHIATRIC: The patient is alert and oriented x 3.  SKIN: Right foot swollen and tender. Large ulcer.  LABORATORY PANEL:   CBC  Recent Labs Lab 02/22/16 0454  WBC 15.6*  HGB 7.2*  HCT 21.9*  PLT 329   ------------------------------------------------------------------------------------------------------------------ Chemistries   Recent Labs Lab 02/21/16 1329 02/22/16 0454  NA 129* 130*  K 3.8 3.8  CL 98* 102  CO2 23 21*  GLUCOSE 235* 226*  BUN 13 13  CREATININE 1.09* 0.95  CALCIUM 8.4* 8.1*  AST 18  --   ALT 13*  --   ALKPHOS 90  --   BILITOT 1.2  --    ------------------------------------------------------------------------------------------------------------------  Cardiac Enzymes No results for input(s): TROPONINI in the last 168 hours. ------------------------------------------------------------------------------------------------------------------  RADIOLOGY:  Mr Foot Right W Wo Contrast  02/22/2016  CLINICAL DATA:  Nonhealing wound along the right midfoot for several months. EXAM: MRI OF THE RIGHT FOREFOOT WITHOUT AND WITH CONTRAST TECHNIQUE: Multiplanar, multisequence MR imaging was performed both before and after administration of intravenous contrast. CONTRAST:  18mL MULTIHANCE GADOBENATE DIMEGLUMINE 529 MG/ML IV SOLN COMPARISON:  None. FINDINGS: TENDONS Peroneal: Peroneal longus tendon intact. Peroneal brevis intact. Posteromedial: Posterior tibial tendon intact. Flexor hallucis longus tendon intact. Flexor digitorum longus tendon intact. Anterior: Tibialis anterior tendon intact. Extensor hallucis longus tendon  intact Extensor digitorum longus tendon intact. Achilles:  Intact. Plantar Fascia: Intact. LIGAMENTS Lateral: Anterior talofibular ligament intact. Posterior talofibular ligament intact. Anterior and posterior tibiofibular ligaments intact. Medial: Deltoid ligament intact. Spring ligament intact. CARTILAGE Ankle Joint: No joint effusion. Normal ankle mortise. No chondral defect. Subtalar Joints/Sinus Tarsi: Normal subtalar joints. No subtalar joint effusion. Normal sinus tarsi. Bones/Soft Tissue: Disorganization and fragmentation with divergence of the metatarsals at the Lisfranc joint consistent with a Charcot joint. 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 Soft tissue ulceration along the plantar lateral aspect of the midfoot with a 3.1 x 2.1 x 2.3 cm fluid collection deep to the wound, but superficial to the musculature consistent with an abscess. Abnormal nonenhancing soft tissue involving the lateral forefoot around the fourth and fifth metatarsals most consistent with necrosis extending from the tips of the digits to the metatarsal heads. There is soft tissue air within the lateral soft tissues extending to the base of the fifth metatarsal. Mild marrow edema within the bases of the fifth metatarsal without cortical destruction which may reflect reactive marrow edema versus early osteomyelitis. Mild marrow edema at the base of the fourth metatarsal likely reactive secondary to the Charcot joint. IMPRESSION: 1. Changes in the right midfoot consistent with a Charcot joint. 2. Soft tissue ulceration along the plantar lateral aspect of the midfoot with a 3.1 x 2.1 x 2.3 cm fluid collection deep to the wound, but superficial to the musculature consistent with an abscess. Abnormal nonenhancing soft tissue involving the lateral forefoot around the fourth and fifth metatarsals most consistent with necrosis extending from the tips of the digits to the metatarsal heads.  There is soft tissue air within the lateral soft tissues extending to the base of the fifth metatarsal. Mild marrow edema within the bases of the fifth metatarsal without cortical destruction which likely reflects reactive marrow edema versus less likely early osteomyelitis. Mild marrow edema at the base of the fourth metatarsal likely reactive secondary to the Charcot joint. 3. 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 Electronically Signed   By: Elige KoHetal  Patel   On: 02/22/2016 13:43   Dg Foot Complete Right  02/21/2016  CLINICAL DATA:  Right foot pain, fever EXAM: RIGHT FOOT COMPLETE - 3+ VIEW COMPARISON:  01/22/2016 FINDINGS: Three views of the right foot submitted. 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 Again noted hypertrophic bony changes at tarsal metatarsal junction. Again noted soft tissue wound mid inferior plantar aspect. There is diffuse soft tissue swelling metatarsal region. There is soft tissue air in the region of fourth and fifth metatarsal phalangeal joint inferior aspect best seen on lateral view. Soft tissue infection or fasciitis cannot be excluded. Clinical correlation is necessary. No definite evidence of osteomyelitis. Further evaluation with MRI could be performed as clinically warranted. 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 Again noted hypertrophic bony changes at tarsal metatarsal junction. Again noted soft tissue wound mid inferior plantar aspect. There is diffuse soft tissue swelling metatarsal region. There is soft tissue air in the region of fourth and fifth metatarsal phalangeal joint inferior aspect best seen on lateral view. Soft tissue infection or fasciitis cannot be excluded. Clinical correlation is necessary. No definite evidence of osteomyelitis. Further evaluation with MRI could be  performed as clinically warranted. Electronically Signed   By: Natasha MeadLiviu  Pop M.D.   On: 02/21/2016 15:03     ASSESSMENT AND PLAN:   * Sepsis  due to  right foot abscess and osteomyelitis Continues to have fever. IV vancomycin and Zosyn. Cultures pending. Patient refusing surgery. Await podiatry to discussed with patient.  * Diabetes Home medications and sliding scale insulin.  * Hypertension  Metoprolol held due to low normal blood pressure.  * Chronic anemia - Likely due to chronic disease and OM.  Monitor.  * Diabetes neuropathy  Continue gabapentin.  * Hyperlipidemia  Continue atorvastatin.  * Smoking  Counseled to quit smoking on admission  Overall poor prognosis if patient continues to refuse surgery. High risk for deterioration and death.  All the records are reviewed and case discussed with Care Management/Social Workerr. Management plans discussed with the patient, family and they are in agreement.  CODE STATUS:DNR  DVT Prophylaxis: SCDs  TOTAL TIME TAKING CARE OF THIS PATIENT: 35 minutes.   POSSIBLE D/C IN 2-3 DAYS, DEPENDING ON CLINICAL CONDITION.  Milagros Loll R M.D on 02/22/2016 at 2:14 PM  Between 7am to 6pm - Pager - [phone]  After 6pm go to [email or web address] - password [email or web address]  [facility]  Office  [phone]  CC: Primary care physician; [facility]  Note: This dictation was prepared with Dragon dictation along with smaller phrase technology. Any transcriptional errors that result from this process are unintentional.

## 2016-02-22 NOTE — Progress Notes (Signed)
Patient ID: [name], female   DOB: Jul 28, 1966, 50 y.o.   MRN: [id number] Stopped in again to see the patient to see if she had changed her mind about having surgery. Patient states she has still not received a phone call that she needs to receive. She is still refusing surgery at this point. We will go ahead and cancel her surgery for tonight and if the patient is still in the hospital I will follow up with her tomorrow.

## 2016-02-22 NOTE — Progress Notes (Signed)
I have spoken to pt several times today about the need for surgery on her foot. She has been running fevers thorough the day. Pt is refusing to sign consent for surgery. She states she will not have any kind of surgery done on her foot.  She is non compliance with her medication at his time. Even though I have explained to her the need of treatment and the need to take her medications, she continue to refuse

## 2016-02-22 NOTE — Progress Notes (Signed)
Day of Surgery  Subjective: Patient seen. She did have some fever and significant chills earlier this afternoon. States that she has decided that she is not going to have any surgery. States she is going to try to get in with the wound care center tomorrow  Objective: Vital signs in last 24 hours: Temp:  [99.5 F (37.5 C)-103 F (39.4 C)] 101.6 F (38.7 C) (05/25 1416) Pulse Rate:  [103-122] 122 (05/25 1416) Resp:  [16-26] 18 (05/25 1416) BP: (99-123)/(47-74) 99/74 mmHg (05/25 1416) SpO2:  [91 %-100 %] 91 % (05/25 1416) Last BM Date: 02/21/16  Intake/Output from previous day:   Intake/Output this shift:    The bandages intact. Wound was not assessed  Lab Results:   Recent Labs  02/21/16 1329 02/22/16 0454  WBC 20.4* 15.6*  HGB 8.3* 7.2*  HCT 25.7* 21.9*  PLT 403 329   BMET  Recent Labs  02/21/16 1329 02/22/16 0454  NA 129* 130*  K 3.8 3.8  CL 98* 102  CO2 23 21*  GLUCOSE 235* 226*  BUN 13 13  CREATININE 1.09* 0.95  CALCIUM 8.4* 8.1*   PT/INR No results for input(s): LABPROT, INR in the last 72 hours. ABG No results for input(s): PHART, HCO3 in the last 72 hours.  Invalid input(s): PCO2, PO2  Studies/Results: Mr Foot Right W Wo Contrast  02/22/2016  CLINICAL DATA:  Nonhealing wound along the right midfoot for several months. EXAM: MRI OF THE RIGHT FOREFOOT WITHOUT AND WITH CONTRAST TECHNIQUE: Multiplanar, multisequence MR imaging was performed both before and after administration of intravenous contrast. CONTRAST:  18mL MULTIHANCE GADOBENATE DIMEGLUMINE 529 MG/ML IV SOLN COMPARISON:  None. FINDINGS: TENDONS Peroneal: Peroneal longus tendon intact. Peroneal brevis intact. Posteromedial: Posterior tibial tendon intact. Flexor hallucis longus tendon intact. Flexor digitorum longus tendon intact. Anterior: Tibialis anterior tendon intact. Extensor hallucis longus tendon intact Extensor digitorum longus tendon intact. Achilles:  Intact. Plantar Fascia: Intact.  LIGAMENTS Lateral: Anterior talofibular ligament intact. Posterior talofibular ligament intact. Anterior and posterior tibiofibular ligaments intact. Medial: Deltoid ligament intact. Spring ligament intact. CARTILAGE Ankle Joint: No joint effusion. Normal ankle mortise. No chondral defect. Subtalar Joints/Sinus Tarsi: Normal subtalar joints. No subtalar joint effusion. Normal sinus tarsi. Bones/Soft Tissue: Disorganization and fragmentation with divergence of the metatarsals at the Lisfranc joint consistent with a Charcot joint. 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 Soft tissue ulceration along the plantar lateral aspect of the midfoot with a 3.1 x 2.1 x 2.3 cm fluid collection deep to the wound, but superficial to the musculature consistent with an abscess. Abnormal nonenhancing soft tissue involving the lateral forefoot around the fourth and fifth metatarsals most consistent with necrosis extending from the tips of the digits to the metatarsal heads. There is soft tissue air within the lateral soft tissues extending to the base of the fifth metatarsal. Mild marrow edema within the bases of the fifth metatarsal without cortical destruction which may reflect reactive marrow edema versus early osteomyelitis. Mild marrow edema at the base of the fourth metatarsal likely reactive secondary to the Charcot joint. IMPRESSION: 1. Changes in the right midfoot consistent with a Charcot joint. 2. Soft tissue ulceration along the plantar lateral aspect of the midfoot with a 3.1 x 2.1 x 2.3 cm fluid collection deep to the wound, but superficial to the musculature consistent with an abscess. Abnormal nonenhancing soft tissue involving the lateral forefoot around the fourth and fifth metatarsals most consistent with necrosis extending from the tips of  the digits to the metatarsal heads. There is soft tissue air within the lateral soft tissues extending to the base of the  fifth metatarsal. Mild marrow edema within the bases of the fifth metatarsal without cortical destruction which likely reflects reactive marrow edema versus less likely early osteomyelitis. Mild marrow edema at the base of the fourth metatarsal likely reactive secondary to the Charcot joint. 3. 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 Electronically Signed   By: Elige Ko   On: 02/22/2016 13:43   Dg Foot Complete Right  02/21/2016  CLINICAL DATA:  Right foot pain, fever EXAM: RIGHT FOOT COMPLETE - 3+ VIEW COMPARISON:  01/22/2016 FINDINGS: Three views of the right foot submitted. 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 Again noted hypertrophic bony changes at tarsal metatarsal junction. Again noted soft tissue wound mid inferior plantar aspect. There is diffuse soft tissue swelling metatarsal region. There is soft tissue air in the region of fourth and fifth metatarsal phalangeal joint inferior aspect best seen on lateral view. Soft tissue infection or fasciitis cannot be excluded. Clinical correlation is necessary. No definite evidence of osteomyelitis. Further evaluation with MRI could be performed as clinically warranted. 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 Again noted hypertrophic bony changes at tarsal metatarsal junction. Again noted soft tissue wound mid inferior plantar aspect. There is diffuse soft tissue swelling metatarsal region. There is soft tissue air in the region of fourth and fifth metatarsal phalangeal joint inferior aspect best seen on lateral view. Soft tissue infection or fasciitis cannot be excluded. Clinical correlation is necessary. No definite evidence of osteomyelitis. Further evaluation with MRI could be performed as clinically warranted. Electronically Signed   By: Natasha Mead M.D.   On:  02/21/2016 15:03    Anti-infectives: Anti-infectives    Start     Dose/Rate Route Frequency Ordered Stop   02/21/16 2200  vancomycin (VANCOCIN) IVPB 1000 mg/200 mL premix     1,000 mg 200 mL/hr over 60 Minutes Intravenous Every 12 hours 02/21/16 1702     02/21/16 2100  piperacillin-tazobactam (ZOSYN) IVPB 3.375 g     3.375 g 12.5 mL/hr over 240 Minutes Intravenous Every 8 hours 02/21/16 1702     02/21/16 1415  vancomycin (VANCOCIN) IVPB 1000 mg/200 mL premix     1,000 mg 200 mL/hr over 60 Minutes Intravenous  Once 02/21/16 1406 02/21/16 1547   02/21/16 1415  piperacillin-tazobactam (ZOSYN) IVPB 3.375 g     3.375 g 100 mL/hr over 30 Minutes Intravenous  Once 02/21/16 1406 02/21/16 1517      Assessment/Plan: s/p Procedure(s): IRRIGATION AND DEBRIDEMENT FOOT (Right) Assessment: Abscess with gas gangrene right forefoot.   Plan: Discussed with the patient that the MRI did show significant gas in the soft tissues with some signs of necrosis in the forefoot area. Discussed that if she does not have any type of surgery that this most likely will quickly progress. Discussed that she may turn septic and it could start to affect other organ systems. Discussed that without surgery she will require a lower limb amputation or if gone untreated that this could eventually kill her. The nursing staff also did relate that she did switch her status to DO NOT RESUSCITATE earlier today. The patient stated that she was going to try to get an appointment to be seen by the wound care center down at [facility] tomorrow. Discussed with the  patient that if she does leave her options from our standpoint would be to return for a BK amputation or to go home and potentially die from her infection. Patient did voice full understanding of the above conversation. At this point we will give the patient a little bit more time to consider her options before we cancel her surgery scheduled for later on this evening  LOS: 1 day     Ricci Barker 02/22/2016

## 2016-02-22 NOTE — Progress Notes (Signed)
In room with daughter in law. Pt states that is ok for me to tell her what all is going on.  i updated her to waht the doctor said about pt continuing to refuse surgery.  Pt is a DNR because of risk of sepsis and death without treatment.

## 2016-02-23 LAB — CBC WITH DIFFERENTIAL/PLATELET
BASOS ABS: 0 10*3/uL (ref 0–0.1)
Basophils Relative: 0 %
EOS ABS: 0.4 10*3/uL (ref 0–0.7)
Eosinophils Relative: 3 %
HCT: 19.3 % — ABNORMAL LOW (ref 35.0–47.0)
HEMOGLOBIN: 6.5 g/dL — AB (ref 12.0–16.0)
LYMPHS ABS: 0.6 10*3/uL — AB (ref 1.0–3.6)
MCH: 26.8 pg (ref 26.0–34.0)
MCHC: 33.5 g/dL (ref 32.0–36.0)
MCV: 80.2 fL (ref 80.0–100.0)
Monocytes Absolute: 0.7 10*3/uL (ref 0.2–0.9)
Monocytes Relative: 6 %
Neutro Abs: 11 10*3/uL — ABNORMAL HIGH (ref 1.4–6.5)
Platelets: 342 10*3/uL (ref 150–440)
RBC: 2.41 MIL/uL — AB (ref 3.80–5.20)
RDW: 15.1 % — ABNORMAL HIGH (ref 11.5–14.5)
WBC: 12.7 10*3/uL — AB (ref 3.6–11.0)

## 2016-02-23 LAB — BASIC METABOLIC PANEL
Anion gap: 7 (ref 5–15)
BUN: 10 mg/dL (ref 6–20)
CO2: 22 mmol/L (ref 22–32)
Calcium: 7.9 mg/dL — ABNORMAL LOW (ref 8.9–10.3)
Chloride: 105 mmol/L (ref 101–111)
Creatinine, Ser: 0.96 mg/dL (ref 0.44–1.00)
GFR calc Af Amer: >60 mL/min (ref >60)
GFR calc non Af Amer: >60 mL/min (ref >60)
Glucose, Bld: 111 mg/dL — ABNORMAL HIGH (ref 65–99)
Potassium: 3.5 mmol/L (ref 3.5–5.1)
Sodium: 134 mmol/L — ABNORMAL LOW (ref 135–145)

## 2016-02-23 LAB — BLOOD CULTURE ID PANEL (REFLEXED)
ACINETOBACTER BAUMANNII: NOT DETECTED
CANDIDA GLABRATA: NOT DETECTED
CANDIDA KRUSEI: NOT DETECTED
CANDIDA PARAPSILOSIS: NOT DETECTED
Candida albicans: NOT DETECTED
Candida tropicalis: NOT DETECTED
Carbapenem resistance: NOT DETECTED
ESCHERICHIA COLI: NOT DETECTED
Enterobacter cloacae complex: NOT DETECTED
Enterobacteriaceae species: NOT DETECTED
Enterococcus species: NOT DETECTED
Haemophilus influenzae: NOT DETECTED
KLEBSIELLA OXYTOCA: NOT DETECTED
KLEBSIELLA PNEUMONIAE: NOT DETECTED
LISTERIA MONOCYTOGENES: NOT DETECTED
Methicillin resistance: NOT DETECTED
NEISSERIA MENINGITIDIS: NOT DETECTED
PSEUDOMONAS AERUGINOSA: NOT DETECTED
Proteus species: NOT DETECTED
SERRATIA MARCESCENS: NOT DETECTED
STREPTOCOCCUS AGALACTIAE: NOT DETECTED
STREPTOCOCCUS SPECIES: NOT DETECTED
Staphylococcus aureus (BCID): DETECTED — AB
Staphylococcus species: DETECTED — AB
Streptococcus pneumoniae: NOT DETECTED
Streptococcus pyogenes: NOT DETECTED
Vancomycin resistance: NOT DETECTED

## 2016-02-23 LAB — GLUCOSE, CAPILLARY
Glucose-Capillary: 101 mg/dL — ABNORMAL HIGH (ref 65–99)
Glucose-Capillary: 221 mg/dL — ABNORMAL HIGH (ref 65–99)
Glucose-Capillary: 107 mg/dL — ABNORMAL HIGH (ref 65–99)
Glucose-Capillary: 130 mg/dL — ABNORMAL HIGH (ref 65–99)

## 2016-02-23 LAB — URINE CULTURE

## 2016-02-23 MED ORDER — CEFAZOLIN SODIUM-DEXTROSE 2-4 GM/100ML-% IV SOLN
2.0000 g | Freq: Three times a day (TID) | INTRAVENOUS | Status: DC
  Administered 2016-02-23 – 2016-02-25 (×8): 2 g via INTRAVENOUS
  Filled 2016-02-23 (×14): qty 100

## 2016-02-23 NOTE — Progress Notes (Signed)
Pharmacy Antibiotic Follow-up Note  Jackie ArgyleCathy D Mcdanel is a 50 y.o. year-old female admitted on 02/21/2016.  The patient is currently on Zosyn and vancomycin.  Blood cultures with MSSA, changing to cefazolin per ID   Assessment/Plan: Cefazolin 2gm IV Q8H   Temp (24hrs), Avg:100.6 F (38.1 C), Min:97 F (36.1 C), Max:102.8 F (39.3 C)   Recent Labs Lab 02/21/16 1329 02/22/16 0454 02/23/16 0643  WBC 20.4* 15.6* 12.7*     Recent Labs Lab 02/21/16 1329 02/22/16 0454 02/23/16 0643  CREATININE 1.09* 0.95 0.96   Estimated Creatinine Clearance: 80.1 mL/min (by C-G formula based on Cr of 0.96).    Allergies  Allergen Reactions  . Ibuprofen Other (See Comments)    Reaction:  Acid reflux    Thank you for allowing pharmacy to be a part of this patient's care.  Antimicrobials: Vancomycin 5/24>5/26 Zosyn 5/24>5/26 Cefazolin 5/26>>  Micro 5/24 Blood - MSSA 1 of 2 5/24 Urine - multiple species  Taleyah Hillman C, Pharm.D. Clinical Pharmacist 02/23/2016 12:35 PM

## 2016-02-23 NOTE — Progress Notes (Signed)
ID phone note  Pt noted to have BCx+ for MSSA, Staph species, R foot ulcer with abscess.  She is on vancomycin and zosy Her WBC is improving Will stop zosyn and vanco Start ancef (ordered) Needs surgical eval (noted she does not want surgery) Needs repeat BCx. (ordered) Spoke with primary Noted wishes to leave AMA.

## 2016-02-23 NOTE — Progress Notes (Signed)
Patient ID: Jackie ArgyleCathy D Proch, female   DOB: [birth year], 50 y.o.   MRN: [id number] [employer] PROGRESS NOTE  Jackie Miles JXB:[id number] DOB: [birth year] DOA: 02/21/2016 [facility]  HPI/Subjective: Patient states that she does not want to have an operation. I advised her if this infection gets overwhelming it could kill her. She stated that that's between her and God. I advised that she would not survive without an amputation to help out with this infection. Patient is waiting for her crew to come in for further decision.  Objective: Filed Vitals:   02/23/16 0351 02/23/16 0730  BP: 109/58 109/60  Pulse: 103 101  Temp: 99.6 F (37.6 C) 99.8 F (37.7 C)  Resp: 20 18   No intake or output data in the 24 hours ending 02/23/16 0909 Filed Weights   02/21/16 1326  Weight: 87.091 kg (192 lb)    ROS: Review of Systems  Constitutional: Negative for fever and chills.  Eyes: Negative for blurred vision.  Respiratory: Negative for cough and shortness of breath.   Cardiovascular: Negative for chest pain.  Gastrointestinal: Negative for nausea, vomiting and abdominal pain.  Genitourinary: Negative for dysuria.  Musculoskeletal: Positive for joint pain.  Neurological: Negative for dizziness and headaches.   Exam: Physical Exam  Constitutional: She is oriented to person, place, and time.  HENT:  Nose: No mucosal edema.  Mouth/Throat: No oropharyngeal exudate or posterior oropharyngeal edema.  Eyes: Conjunctivae, EOM and lids are normal. Pupils are equal, round, and reactive to light.  Neck: No JVD present. Carotid bruit is not present. No edema present. No thyroid mass and no thyromegaly present.  Cardiovascular: S1 normal and S2 normal.  Exam reveals no gallop.   No murmur heard. Pulses:      Dorsalis pedis pulses are 2+ on the right side, and 2+ on the left side.  Respiratory: No respiratory distress. She has no wheezes. She has no rhonchi. She has no rales.   Musculoskeletal:       Right ankle: She exhibits no swelling.       Left ankle: She exhibits no swelling.  Lymphadenopathy:    She has no cervical adenopathy.  Neurological: She is alert and oriented to person, place, and time. No cranial nerve deficit.  Skin: Skin is warm. Nails show no clubbing.  Right foot covered with dressing.  Psychiatric: She has a normal mood and affect.      Data Reviewed: Basic Metabolic Panel:  Recent Labs Lab 02/21/16 1329 02/22/16 0454 02/23/16 0643  NA 129* 130* 134*  K 3.8 3.8 3.5  CL 98* 102 105  CO2 23 21* 22  GLUCOSE 235* 226* 111*  BUN 13 13 10   CREATININE 1.09* 0.95 0.96  CALCIUM 8.4* 8.1* 7.9*   Liver Function Tests:  Recent Labs Lab 02/21/16 1329  AST 18  ALT 13*  ALKPHOS 90  BILITOT 1.2  PROT 7.7  ALBUMIN 2.9*   CBC:  Recent Labs Lab 02/21/16 1329 02/22/16 0454 02/23/16 0643  WBC 20.4* 15.6* 12.7*  NEUTROABS 16.9*  --  11.0*  HGB 8.3* 7.2* 6.5*  HCT 25.7* 21.9* 19.3*  MCV 82.4 80.8 80.2  PLT 403 329 342    CBG:  Recent Labs Lab 02/22/16 0347 02/22/16 0742 02/22/16 1514 02/22/16 2132 02/23/16 0731  GLUCAP 233* 154* 142* 145* 101*    Recent Results (from the past 240 hour(s))  Blood Culture (routine x 2)     Status: None (Preliminary result)   Collection  Time: 02/21/16  1:29 PM  Result Value Ref Range Status   Specimen Description BLOOD RIGHT AC  Final   Special Requests   Final    BOTTLES DRAWN AEROBIC AND ANAEROBIC AER ANA   Culture  Setup Time   Final    GRAM POSITIVE COCCI AEROBIC BOTTLE ONLY CRITICAL RESULT CALLED TO, READ BACK BY AND VERIFIED WITH: NATE COOKSON ON 02/23/16 AT 0400 BY TLB CONFIRMED BY TLB/CAF Organism ID to follow    Culture   Final    GRAM POSITIVE COCCI AEROBIC BOTTLE ONLY IDENTIFICATION TO FOLLOW    Report Status PENDING  Incomplete  Blood Culture ID Panel (Reflexed)     Status: Abnormal   Collection Time: 02/21/16  1:29 PM  Result Value Ref Range Status    Enterococcus species NOT DETECTED NOT DETECTED Final   Vancomycin resistance NOT DETECTED NOT DETECTED Final   Listeria monocytogenes NOT DETECTED NOT DETECTED Final   Staphylococcus species DETECTED (A) NOT DETECTED Final    Comment: CRITICAL RESULT CALLED TO, READ BACK BY AND VERIFIED WITH: NATE COOKSON ON 02/23/16 AT 0400 BY TLB    Staphylococcus aureus DETECTED (A) NOT DETECTED Final    Comment: CRITICAL RESULT CALLED TO, READ BACK BY AND VERIFIED WITH: NATE COOKSON ON 02/23/16 AT 0400 BY TLB    Methicillin resistance NOT DETECTED NOT DETECTED Final   Streptococcus species NOT DETECTED NOT DETECTED Final   Streptococcus agalactiae NOT DETECTED NOT DETECTED Final   Streptococcus pneumoniae NOT DETECTED NOT DETECTED Final   Streptococcus pyogenes NOT DETECTED NOT DETECTED Final   Acinetobacter baumannii NOT DETECTED NOT DETECTED Final   Enterobacteriaceae species NOT DETECTED NOT DETECTED Final   Enterobacter cloacae complex NOT DETECTED NOT DETECTED Final   Escherichia coli NOT DETECTED NOT DETECTED Final   Klebsiella oxytoca NOT DETECTED NOT DETECTED Final   Klebsiella pneumoniae NOT DETECTED NOT DETECTED Final   Proteus species NOT DETECTED NOT DETECTED Final   Serratia marcescens NOT DETECTED NOT DETECTED Final   Carbapenem resistance NOT DETECTED NOT DETECTED Final   Haemophilus influenzae NOT DETECTED NOT DETECTED Final   Neisseria meningitidis NOT DETECTED NOT DETECTED Final   Pseudomonas aeruginosa NOT DETECTED NOT DETECTED Final   Candida albicans NOT DETECTED NOT DETECTED Final   Candida glabrata NOT DETECTED NOT DETECTED Final   Candida krusei NOT DETECTED NOT DETECTED Final   Candida parapsilosis NOT DETECTED NOT DETECTED Final   Candida tropicalis NOT DETECTED NOT DETECTED Final  Blood Culture (routine x 2)     Status: None (Preliminary result)   Collection Time: 02/21/16  2:25 PM  Result Value Ref Range Status   Specimen Description BLOOD  RT AC  Final   Special  Requests BOTTLES DRAWN AEROBIC AND ANAEROBIC 10CC  Final   Culture NO GROWTH < 24 HOURS  Final   Report Status PENDING  Incomplete  Urine culture     Status: Abnormal   Collection Time: 02/21/16  2:25 PM  Result Value Ref Range Status   Specimen Description URINE, RANDOM  Final   Special Requests NONE  Final   Culture MULTIPLE SPECIES PRESENT, SUGGEST RECOLLECTION (A)  Final   Report Status 02/23/2016 FINAL  Final     Studies: Mr Foot Right W Wo Contrast  02/22/2016  CLINICAL DATA:  Nonhealing wound along the right midfoot for several months. EXAM: MRI OF THE RIGHT FOREFOOT WITHOUT AND WITH CONTRAST TECHNIQUE: Multiplanar, multisequence MR imaging was performed both before and after administration of  intravenous contrast. CONTRAST:  18mL MULTIHANCE GADOBENATE DIMEGLUMINE 529 MG/ML IV SOLN COMPARISON:  None. FINDINGS: TENDONS Peroneal: Peroneal longus tendon intact. Peroneal brevis intact. Posteromedial: Posterior tibial tendon intact. Flexor hallucis longus tendon intact. Flexor digitorum longus tendon intact. Anterior: Tibialis anterior tendon intact. Extensor hallucis longus tendon intact Extensor digitorum longus tendon intact. Achilles:  Intact. Plantar Fascia: Intact. LIGAMENTS Lateral: Anterior talofibular ligament intact. Posterior talofibular ligament intact. Anterior and posterior tibiofibular ligaments intact. Medial: Deltoid ligament intact. Spring ligament intact. CARTILAGE Ankle Joint: No joint effusion. Normal ankle mortise. No chondral defect. Subtalar Joints/Sinus Tarsi: Normal subtalar joints. No subtalar joint effusion. Normal sinus tarsi. Bones/Soft Tissue: Disorganization and fragmentation with divergence of the metatarsals at the Lisfranc joint consistent with a Charcot joint. 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 Soft tissue ulceration along the plantar lateral aspect of the midfoot with a 3.1 x 2.1 x 2.3 cm fluid collection  deep to the wound, but superficial to the musculature consistent with an abscess. Abnormal nonenhancing soft tissue involving the lateral forefoot around the fourth and fifth metatarsals most consistent with necrosis extending from the tips of the digits to the metatarsal heads. There is soft tissue air within the lateral soft tissues extending to the base of the fifth metatarsal. Mild marrow edema within the bases of the fifth metatarsal without cortical destruction which may reflect reactive marrow edema versus early osteomyelitis. Mild marrow edema at the base of the fourth metatarsal likely reactive secondary to the Charcot joint. IMPRESSION: 1. Changes in the right midfoot consistent with a Charcot joint. 2. Soft tissue ulceration along the plantar lateral aspect of the midfoot with a 3.1 x 2.1 x 2.3 cm fluid collection deep to the wound, but superficial to the musculature consistent with an abscess. Abnormal nonenhancing soft tissue involving the lateral forefoot around the fourth and fifth metatarsals most consistent with necrosis extending from the tips of the digits to the metatarsal heads. There is soft tissue air within the lateral soft tissues extending to the base of the fifth metatarsal. Mild marrow edema within the bases of the fifth metatarsal without cortical destruction which likely reflects reactive marrow edema versus less likely early osteomyelitis. Mild marrow edema at the base of the fourth metatarsal likely reactive secondary to the Charcot joint. 3. 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 Electronically Signed   By: Elige Ko   On: 02/22/2016 13:43   Dg Foot Complete Right  02/21/2016  CLINICAL DATA:  Right foot pain, fever EXAM: RIGHT FOOT COMPLETE - 3+ VIEW COMPARISON:  01/22/2016 FINDINGS: Three views of the right foot submitted. 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  Again noted hypertrophic bony changes at tarsal metatarsal junction. Again noted soft tissue wound mid inferior plantar aspect. There is diffuse soft tissue swelling metatarsal region. There is soft tissue air in the region of fourth and fifth metatarsal phalangeal joint inferior aspect best seen on lateral view. Soft tissue infection or fasciitis cannot be excluded. Clinical correlation is necessary. No definite evidence of osteomyelitis. Further evaluation with MRI could be performed as clinically warranted. 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 Again noted hypertrophic bony changes at tarsal metatarsal junction. Again noted soft tissue wound mid inferior plantar aspect. There is diffuse soft tissue swelling metatarsal region. There is soft tissue air in the region of fourth and fifth metatarsal phalangeal joint inferior aspect  best seen on lateral view. Soft tissue infection or fasciitis cannot be excluded. Clinical correlation is necessary. No definite evidence of osteomyelitis. Further evaluation with MRI could be performed as clinically warranted. Electronically Signed   By: Natasha Mead M.D.   On: 02/21/2016 15:03    Scheduled Meds: . amitriptyline  100 mg Oral QHS  . aspirin EC  81 mg Oral Daily  . atorvastatin  20 mg Oral QHS  . gabapentin  800 mg Oral q morning - 10a   And  . gabapentin  1,600 mg Oral QHS  . heparin  5,000 Units Subcutaneous Q8H  . insulin aspart  0-9 Units Subcutaneous TID WC  . insulin aspart  7 Units Subcutaneous TID WC  . insulin glargine  20 Units Subcutaneous QHS  . loratadine  10 mg Oral Daily  . metFORMIN  1,000 mg Oral BID WC  . naproxen  500 mg Oral BID WC  . piperacillin-tazobactam (ZOSYN)  IV  3.375 g Intravenous Q8H  . vancomycin  1,000 mg Intravenous Q12H  . vitamin B-12  1,000 mcg Oral Daily   Continuous Infusions: . sodium chloride 100 mL/hr at 02/22/16 1430     Assessment/Plan:  1. Sepsis due to right foot abscess and likely osteomyelitis. Case discussed with podiatry and they recommend a BKA. Patient does not want to have an amputation. I explained that this infection cannot be handled with antibiotics alone. At this infection may become overwhelming and cause death. Patient understands this. Blood cultures positive for gram-positive cocci. Patient on IV vancomycin and Zosyn. I will get infectious disease consultation. If she leaves the hospital be AGAINST MEDICAL ADVICE. Patient is competent to make a bad medical decision for herself. 2. Type 2 diabetes mellitus. Continue home medications and sliding scale 3. Essential hypertension. Metoprolol on hold secondary to low blood pressure 4. Chronic anemia. This is anemia of chronic disease 5. Diabetic neuropathy on gabapentin 6. Hyperlipidemia unspecified on atorvastatin 7. Tobacco abuse  Code Status:     Code Status Orders        Start     Ordered   02/22/16 1409  Do not attempt resuscitation (DNR)   Continuous    Question Answer Comment  In the event of cardiac or respiratory ARREST Do not call a "code blue"   In the event of cardiac or respiratory ARREST Do not perform Intubation, CPR, defibrillation or ACLS   In the event of cardiac or respiratory ARREST Use medication by any route, position, wound care, and other measures to relive pain and suffering. May use oxygen, suction and manual treatment of airway obstruction as needed for comfort.      02/22/16 1408    Code Status History    Date Active Date Inactive Code Status Order ID Comments User Context   02/21/2016  6:35 PM 02/22/2016  2:08 PM Full Code [id number]  Altamese Dilling, MD Inpatient   01/22/2016  6:13 PM 01/22/2016 11:49 PM Full Code [id number]  Auburn Bilberry, MD ED   01/12/2016  3:28 AM 01/14/2016  2:43 PM Full Code [id number]  Enedina Finner, MD ED     Disposition Plan: Hopefully the patient will stay in the hospital for IV  antibiotics. Hopefully can convince her at some point to proceed with amputation.  Consultants:  Podiatry  Infectious disease  Antibiotics:  Vancomycin    Zosyn   Time spent: 30 minutes  Alford Highland  [employer]

## 2016-02-23 NOTE — Progress Notes (Signed)
1 Day Post-Op  Subjective: Patient seen. States she is still not willing to have surgery. States her pastor is coming bilateral today. States she has lived a good life  Objective: Vital signs in last 24 hours: Temp:  [97 F (36.1 C)-102.1 F (38.9 C)] 99.8 F (37.7 C) (05/26 0730) Pulse Rate:  [101-122] 101 (05/26 0730) Resp:  [18-20] 18 (05/26 0730) BP: (99-111)/(52-74) 109/60 mmHg (05/26 0730) SpO2:  [91 %-98 %] 98 % (05/26 0730) Last BM Date: 02/21/16  Intake/Output from previous day:   Intake/Output this shift:    Some blister formation is noted on the dorsal aspect of the right foot in the fourth interspace today. Fifth toe is starting to turn a little the and cyanotic. Significant increase in necrosis along the plantar mid foot region. No purulence could be expressed from the plantar aspect of the foot. This again is concerning for gas in the tissues  Lab Results:   Recent Labs  02/22/16 0454 02/23/16 0643  WBC 15.6* 12.7*  HGB 7.2* 6.5*  HCT 21.9* 19.3*  PLT 329 342   BMET  Recent Labs  02/22/16 0454 02/23/16 0643  NA 130* 134*  K 3.8 3.5  CL 102 105  CO2 21* 22  GLUCOSE 226* 111*  BUN 13 10  CREATININE 0.95 0.96  CALCIUM 8.1* 7.9*   PT/INR No results for input(s): LABPROT, INR in the last 72 hours. ABG No results for input(s): PHART, HCO3 in the last 72 hours.  Invalid input(s): PCO2, PO2  Studies/Results: Mr Foot Right W Wo Contrast  02/22/2016  CLINICAL DATA:  Nonhealing wound along the right midfoot for several months. EXAM: MRI OF THE RIGHT FOREFOOT WITHOUT AND WITH CONTRAST TECHNIQUE: Multiplanar, multisequence MR imaging was performed both before and after administration of intravenous contrast. CONTRAST:  18mL MULTIHANCE GADOBENATE DIMEGLUMINE 529 MG/ML IV SOLN COMPARISON:  None. FINDINGS: TENDONS Peroneal: Peroneal longus tendon intact. Peroneal brevis intact. Posteromedial: Posterior tibial tendon intact. Flexor hallucis longus tendon intact.  Flexor digitorum longus tendon intact. Anterior: Tibialis anterior tendon intact. Extensor hallucis longus tendon intact Extensor digitorum longus tendon intact. Achilles:  Intact. Plantar Fascia: Intact. LIGAMENTS Lateral: Anterior talofibular ligament intact. Posterior talofibular ligament intact. Anterior and posterior tibiofibular ligaments intact. Medial: Deltoid ligament intact. Spring ligament intact. CARTILAGE Ankle Joint: No joint effusion. Normal ankle mortise. No chondral defect. Subtalar Joints/Sinus Tarsi: Normal subtalar joints. No subtalar joint effusion. Normal sinus tarsi. Bones/Soft Tissue: Disorganization and fragmentation with divergence of the metatarsals at the Lisfranc joint consistent with a Charcot joint. 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 Soft tissue ulceration along the plantar lateral aspect of the midfoot with a 3.1 x 2.1 x 2.3 cm fluid collection deep to the wound, but superficial to the musculature consistent with an abscess. Abnormal nonenhancing soft tissue involving the lateral forefoot around the fourth and fifth metatarsals most consistent with necrosis extending from the tips of the digits to the metatarsal heads. There is soft tissue air within the lateral soft tissues extending to the base of the fifth metatarsal. Mild marrow edema within the bases of the fifth metatarsal without cortical destruction which may reflect reactive marrow edema versus early osteomyelitis. Mild marrow edema at the base of the fourth metatarsal likely reactive secondary to the Charcot joint. IMPRESSION: 1. Changes in the right midfoot consistent with a Charcot joint. 2. Soft tissue ulceration along the plantar lateral aspect of the midfoot with a 3.1 x 2.1 x 2.3 cm fluid collection  deep to the wound, but superficial to the musculature consistent with an abscess. Abnormal nonenhancing soft tissue involving the lateral forefoot around the fourth  and fifth metatarsals most consistent with necrosis extending from the tips of the digits to the metatarsal heads. There is soft tissue air within the lateral soft tissues extending to the base of the fifth metatarsal. Mild marrow edema within the bases of the fifth metatarsal without cortical destruction which likely reflects reactive marrow edema versus less likely early osteomyelitis. Mild marrow edema at the base of the fourth metatarsal likely reactive secondary to the Charcot joint. 3. 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 Electronically Signed   By: Elige KoHetal  Patel   On: 02/22/2016 13:43   Dg Foot Complete Right  02/21/2016  CLINICAL DATA:  Right foot pain, fever EXAM: RIGHT FOOT COMPLETE - 3+ VIEW COMPARISON:  01/22/2016 FINDINGS: Three views of the right foot submitted. 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 Again noted hypertrophic bony changes at tarsal metatarsal junction. Again noted soft tissue wound mid inferior plantar aspect. There is diffuse soft tissue swelling metatarsal region. There is soft tissue air in the region of fourth and fifth metatarsal phalangeal joint inferior aspect best seen on lateral view. Soft tissue infection or fasciitis cannot be excluded. Clinical correlation is necessary. No definite evidence of osteomyelitis. Further evaluation with MRI could be performed as clinically warranted. 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 Again noted hypertrophic bony changes at tarsal metatarsal junction. Again noted soft tissue wound mid inferior plantar aspect. There is diffuse soft tissue swelling metatarsal region. There is soft tissue air in the region of fourth and fifth metatarsal phalangeal joint inferior aspect best seen on lateral view. Soft tissue infection or fasciitis cannot be excluded.  Clinical correlation is necessary. No definite evidence of osteomyelitis. Further evaluation with MRI could be performed as clinically warranted. Electronically Signed   By: Natasha MeadLiviu  Pop M.D.   On: 02/21/2016 15:03    Anti-infectives: Anti-infectives    Start     Dose/Rate Route Frequency Ordered Stop   02/21/16 2200  vancomycin (VANCOCIN) IVPB 1000 mg/200 mL premix     1,000 mg 200 mL/hr over 60 Minutes Intravenous Every 12 hours 02/21/16 1702     02/21/16 2100  piperacillin-tazobactam (ZOSYN) IVPB 3.375 g     3.375 g 12.5 mL/hr over 240 Minutes Intravenous Every 8 hours 02/21/16 1702     02/21/16 1415  vancomycin (VANCOCIN) IVPB 1000 mg/200 mL premix     1,000 mg 200 mL/hr over 60 Minutes Intravenous  Once 02/21/16 1406 02/21/16 1547   02/21/16 1415  piperacillin-tazobactam (ZOSYN) IVPB 3.375 g     3.375 g 100 mL/hr over 30 Minutes Intravenous  Once 02/21/16 1406 02/21/16 1517      Assessment/Plan: s/p Procedure(s): IRRIGATION AND DEBRIDEMENT FOOT (Right) Assessment: Gas gangrene with necrosis right foot   Plan: The wound was re-bandaged. Discussed with the patient at this point her only option would be a below-knee amputation to eradicate the infection. Discussed with the patient that if she does not have this done this will be a life ending of them out for her. Again the patient states she has lived good life and has no wishes to have any surgery at this point. A surpassed her is coming by to see her later on today. At this point we will sign off as from a  foot standpoint there is not much else left to do.  LOS: 2 days    Ricci Barker 02/23/2016

## 2016-02-23 NOTE — Progress Notes (Signed)
Patient BP 99/47, HR 101, Temp 100.6. MD Wieting notified. No new orders at this time.   Harvie HeckMelanie Fuquan Wilson, RN

## 2016-02-23 NOTE — Care Management (Signed)
Patient was off unit when I rounded- dressed and talking about leaving AMA. I waited for patient to return and she was assisted by "her uncle" pushing her in a wheelchair. I assisted patient back to bed and covered her up as she was cold. I talked to her at length after MD informed CM that patient has positive blood culture and potentially lower extremity amputation. I talked to her about her faith (she is [place]) and the hope/faith that the Shaune PollackLord would heal her. I encouraged her to talk to her pastor about this need and the risks of not allowing physicians to treat her. She agreed to do that today. Patient may need leg amputation and IV antibiotics- referral to [facility] for potential IV ABX.

## 2016-02-23 NOTE — Plan of Care (Signed)
Problem: Safety: Goal: Ability to remain free from injury will improve Outcome: Progressing Patient refusing care

## 2016-02-23 NOTE — Progress Notes (Signed)
Pharmacy Antibiotic Follow-up Note  Jackie Miles is a 50 y.o. year-old female admitted on 02/21/2016.  The patient is currently on Zosyn and vancomycin.  Assessment/Plan: Lab called to report BCID GPC aerobic bottle of 1 set identified staph aureus, MecA not detected. Spoke with Dr. Tobi BastosPyreddy - no need to change at this point.   Temp (24hrs), Avg:100.2 F (37.9 C), Min:97 F (36.1 C), Max:102.1 F (38.9 C)   Recent Labs Lab 02/21/16 1329 02/22/16 0454  WBC 20.4* 15.6*    Recent Labs Lab 02/21/16 1329 02/22/16 0454  CREATININE 1.09* 0.95   Estimated Creatinine Clearance: 81 mL/min (by C-G formula based on Cr of 0.95).    Allergies  Allergen Reactions  . Ibuprofen Other (See Comments)    Reaction:  Acid reflux    Thank you for allowing pharmacy to be a part of this patient's care.  Carola FrostNathan A Zerenity Bowron, Pharm.D., BCPS Clinical Pharmacist 02/23/2016 4:07 AM

## 2016-02-24 LAB — BASIC METABOLIC PANEL
Anion gap: 8 (ref 5–15)
BUN: 9 mg/dL (ref 6–20)
CALCIUM: 8.4 mg/dL — AB (ref 8.9–10.3)
CHLORIDE: 105 mmol/L (ref 101–111)
CO2: 22 mmol/L (ref 22–32)
Creatinine, Ser: 0.92 mg/dL (ref 0.44–1.00)
GFR calc Af Amer: >60 mL/min (ref >60)
GLUCOSE: 103 mg/dL — AB (ref 65–99)
Potassium: 3.4 mmol/L — ABNORMAL LOW (ref 3.5–5.1)
Sodium: 135 mmol/L (ref 135–145)

## 2016-02-24 LAB — GLUCOSE, CAPILLARY
GLUCOSE-CAPILLARY: 176 mg/dL — AB (ref 65–99)
Glucose-Capillary: 107 mg/dL — ABNORMAL HIGH (ref 65–99)
Glucose-Capillary: 190 mg/dL — ABNORMAL HIGH (ref 65–99)
Glucose-Capillary: 166 mg/dL — ABNORMAL HIGH (ref 65–99)

## 2016-02-24 LAB — CBC
HEMATOCRIT: 21.2 % — AB (ref 35.0–47.0)
Hemoglobin: 7.1 g/dL — ABNORMAL LOW (ref 12.0–16.0)
MCH: 27.2 pg (ref 26.0–34.0)
MCHC: 33.5 g/dL (ref 32.0–36.0)
MCV: 81.1 fL (ref 80.0–100.0)
Platelets: 386 10*3/uL (ref 150–440)
RBC: 2.61 MIL/uL — ABNORMAL LOW (ref 3.80–5.20)
RDW: 15.5 % — AB (ref 11.5–14.5)
WBC: 11.9 10*3/uL — ABNORMAL HIGH (ref 3.6–11.0)

## 2016-02-24 MED ORDER — PANTOPRAZOLE SODIUM 40 MG PO TBEC
40.0000 mg | DELAYED_RELEASE_TABLET | Freq: Every day | ORAL | Status: DC
  Administered 2016-02-25 – 2016-02-26 (×2): 40 mg via ORAL
  Filled 2016-02-24 (×2): qty 1

## 2016-02-24 MED ORDER — FERROUS SULFATE 325 (65 FE) MG PO TABS
325.0000 mg | ORAL_TABLET | Freq: Two times a day (BID) | ORAL | Status: DC
  Administered 2016-02-24 – 2016-02-25 (×3): 325 mg via ORAL
  Filled 2016-02-24 (×4): qty 1

## 2016-02-24 MED ORDER — POTASSIUM CHLORIDE CRYS ER 20 MEQ PO TBCR
40.0000 meq | EXTENDED_RELEASE_TABLET | Freq: Once | ORAL | Status: AC
  Administered 2016-02-24: 40 meq via ORAL
  Filled 2016-02-24: qty 2

## 2016-02-24 MED ORDER — ALUM & MAG HYDROXIDE-SIMETH 200-200-20 MG/5ML PO SUSP
10.0000 mL | Freq: Four times a day (QID) | ORAL | Status: DC | PRN
  Administered 2016-02-24: 10 mL via ORAL
  Filled 2016-02-24: qty 30

## 2016-02-24 NOTE — Progress Notes (Signed)
Additional novolog discontinued per Dr. Renae GlossWieting. Pt BG 107 this am, eating well. Will continue to monitor. Will notify DM coordinator.

## 2016-02-24 NOTE — Progress Notes (Addendum)
Patient ID: Jackie Miles, female   DOB: 24-Dec-1965, 50 y.o.   MRN: [id number] [facility] PROGRESS NOTE  TYARRA NOLTON WUJ:[id number] DOB: 08/05/1966 DOA: 02/21/2016 PCP: Lucienne M[facility]  HPI/Subjective: Patient again states that she does not want to have an operation. Having some pain in the foot but tolerable.  Objective: Filed Vitals:   02/24/16 0415 02/24/16 0752  BP: 122/66 125/58  Pulse: 104 65  Temp: 98.4 F (36.9 C) 99.1 F (37.3 C)  Resp: 20 18    Filed Weights   02/21/16 1326  Weight: 87.091 kg (192 lb)    ROS: Review of Systems  Constitutional: Negative for fever and chills.  Eyes: Negative for blurred vision.  Respiratory: Negative for cough and shortness of breath.   Cardiovascular: Negative for chest pain.  Gastrointestinal: Negative for nausea, vomiting and abdominal pain.  Genitourinary: Negative for dysuria.  Musculoskeletal: Positive for joint pain.  Neurological: Negative for dizziness and headaches.   Exam: Physical Exam  Constitutional: She is oriented to person, place, and time.  HENT:  Nose: No mucosal edema.  Mouth/Throat: No oropharyngeal exudate or posterior oropharyngeal edema.  Eyes: Conjunctivae, EOM and lids are normal. Pupils are equal, round, and reactive to light.  Neck: No JVD present. Carotid bruit is not present. No edema present. No thyroid mass and no thyromegaly present.  Cardiovascular: S1 normal and S2 normal.  Exam reveals no gallop.   No murmur heard. Pulses:      Dorsalis pedis pulses are 2+ on the right side, and 2+ on the left side.  Respiratory: No respiratory distress. She has no wheezes. She has no rhonchi. She has no rales.  Musculoskeletal:       Right ankle: She exhibits no swelling.       Left ankle: She exhibits no swelling.  Lymphadenopathy:    She has no cervical adenopathy.  Neurological: She is alert and oriented to person, place, and time. No cranial nerve deficit.  Skin: Skin is warm. Nails  show no clubbing.  Right bottom of foot ulcer size of a quarter with blackish discoloration over the bottom of the foot. Top of the foot on the lateral side large blister that popped with erythema surrounding.  Psychiatric: She has a normal mood and affect.      Data Reviewed: Basic Metabolic Panel:  Recent Labs Lab 02/21/16 1329 02/22/16 0454 02/23/16 0643 02/24/16 0418  NA 129* 130* 134* 135  K 3.8 3.8 3.5 3.4*  CL 98* 102 105 105  CO2 23 21* 22 22  GLUCOSE 235* 226* 111* 103*  BUN CREATININE 1.09* 0.95 0.96 0.92  CALCIUM 8.4* 8.1* 7.9* 8.4*   Liver Function Tests:  Recent Labs Lab 02/21/16 1329  AST 18  ALT 13*  ALKPHOS 90  BILITOT 1.2  PROT 7.7  ALBUMIN 2.9*   CBC:  Recent Labs Lab 02/21/16 1329 02/22/16 0454 02/23/16 0643 02/24/16 0418  WBC 20.4* 15.6* 12.7* 11.9*  NEUTROABS 16.9*  --  11.0*  --   HGB 8.3* 7.2* 6.5* 7.1*  HCT 25.7* 21.9* 19.3* 21.2*  MCV 82.4 80.8 80.2 81.1  PLT 403 329 342 386    CBG:  Recent Labs Lab 02/23/16 1152 02/23/16 1608 02/23/16 2104 02/24/16 0754 02/24/16 1226  GLUCAP 221* 107* 130* 107* 176*    Recent Results (from the past 240 hour(s))  Blood Culture (routine x 2)     Status: Abnormal (Preliminary result)   Collection Time: 02/21/16  1:29 PM  Result Value Ref Range Status   Specimen Description BLOOD RIGHT AC  Final   Special Requests   Final    BOTTLES DRAWN AEROBIC AND ANAEROBIC AER ANA   Culture  Setup Time   Final    GRAM POSITIVE COCCI AEROBIC BOTTLE ONLY CRITICAL RESULT CALLED TO, READ BACK BY AND VERIFIED WITH: NATE COOKSON ON 02/23/16 AT 0400 BY TLB CONFIRMED BY TLB/CAF    Culture (A)  Final    STAPHYLOCOCCUS AUREUS AEROBIC BOTTLE ONLY SUSCEPTIBILITIES TO FOLLOW    Report Status PENDING  Incomplete  Blood Culture ID Panel (Reflexed)     Status: Abnormal   Collection Time: 02/21/16  1:29 PM  Result Value Ref Range Status   Enterococcus species NOT DETECTED NOT DETECTED  Final   Vancomycin resistance NOT DETECTED NOT DETECTED Final   Listeria monocytogenes NOT DETECTED NOT DETECTED Final   Staphylococcus species DETECTED (A) NOT DETECTED Final    Comment: CRITICAL RESULT CALLED TO, READ BACK BY AND VERIFIED WITH: NATE COOKSON ON 02/23/16 AT 0400 BY TLB    Staphylococcus aureus DETECTED (A) NOT DETECTED Final    Comment: CRITICAL RESULT CALLED TO, READ BACK BY AND VERIFIED WITH: NATE COOKSON ON 02/23/16 AT 0400 BY TLB    Methicillin resistance NOT DETECTED NOT DETECTED Final   Streptococcus species NOT DETECTED NOT DETECTED Final   Streptococcus agalactiae NOT DETECTED NOT DETECTED Final   Streptococcus pneumoniae NOT DETECTED NOT DETECTED Final   Streptococcus pyogenes NOT DETECTED NOT DETECTED Final   Acinetobacter baumannii NOT DETECTED NOT DETECTED Final   Enterobacteriaceae species NOT DETECTED NOT DETECTED Final   Enterobacter cloacae complex NOT DETECTED NOT DETECTED Final   Escherichia coli NOT DETECTED NOT DETECTED Final   Klebsiella oxytoca NOT DETECTED NOT DETECTED Final   Klebsiella pneumoniae NOT DETECTED NOT DETECTED Final   Proteus species NOT DETECTED NOT DETECTED Final   Serratia marcescens NOT DETECTED NOT DETECTED Final   Carbapenem resistance NOT DETECTED NOT DETECTED Final   Haemophilus influenzae NOT DETECTED NOT DETECTED Final   Neisseria meningitidis NOT DETECTED NOT DETECTED Final   Pseudomonas aeruginosa NOT DETECTED NOT DETECTED Final   Candida albicans NOT DETECTED NOT DETECTED Final   Candida glabrata NOT DETECTED NOT DETECTED Final   Candida krusei NOT DETECTED NOT DETECTED Final   Candida parapsilosis NOT DETECTED NOT DETECTED Final   Candida tropicalis NOT DETECTED NOT DETECTED Final  Blood Culture (routine x 2)     Status: None (Preliminary result)   Collection Time: 02/21/16  2:25 PM  Result Value Ref Range Status   Specimen Description BLOOD  RT [facility]  Final   Special Requests BOTTLES DRAWN AEROBIC AND ANAEROBIC 10CC   Final   Culture NO GROWTH 3 DAYS  Final   Report Status PENDING  Incomplete  Urine culture     Status: Abnormal   Collection Time: 02/21/16  2:25 PM  Result Value Ref Range Status   Specimen Description URINE, RANDOM  Final   Special Requests NONE  Final   Culture MULTIPLE SPECIES PRESENT, SUGGEST RECOLLECTION (A)  Final   Report Status 02/23/2016 FINAL  Final  Culture, blood (Routine X 2) w Reflex to ID Panel     Status: None (Preliminary result)   Collection Time: 02/23/16  1:15 PM  Result Value Ref Range Status   Specimen Description BLOOD RIGHT HAND  Final   Special Requests BOTTLES DRAWN AEROBIC AND ANAEROBIC 9CCAERO,8CCANA  Final   Culture NO  GROWTH < 24 HOURS  Final   Report Status PENDING  Incomplete     Studies: No results found.  Scheduled Meds: . amitriptyline  100 mg Oral QHS  . aspirin EC  81 mg Oral Daily  . atorvastatin  20 mg Oral QHS  .  ceFAZolin (ANCEF) IV  2 g Intravenous Q8H  . gabapentin  800 mg Oral q morning - 10a   And  . gabapentin  1,600 mg Oral QHS  . heparin  5,000 Units Subcutaneous Q8H  . insulin aspart  0-9 Units Subcutaneous TID WC  . insulin glargine  20 Units Subcutaneous QHS  . loratadine  10 mg Oral Daily  . metFORMIN  1,000 mg Oral BID WC  . naproxen  500 mg Oral BID WC  . potassium chloride  40 mEq Oral Once  . vitamin B-12  1,000 mcg Oral Daily    Assessment/Plan:  1. Sepsis due to right foot abscess and likely osteomyelitis. Patient refuses any surgical intervention. Patient is on IV Ancef for MSSA growing in blood cultures. Sister also at the bedside and explained the seriousness of the patient's condition. The patient still refuses any surgical intervention at this time. I told her that IV antibiotics would not cure this infection. So far repeat blood cultures are negative. 2. Type 2 diabetes mellitus. Continue home medications and sliding scale 3. Essential hypertension. Metoprolol on hold secondary to low blood  pressure 4. Chronic anemia. This is anemia of chronic disease. Patient may end up needing a transfusion while here in the hospital. 5. Diabetic neuropathy on gabapentin 6. Hyperlipidemia unspecified on atorvastatin 7. Tobacco abuse  Code Status:     Code Status Orders        Start     Ordered   02/22/16 1409  Do not attempt resuscitation (DNR)   Continuous    Question Answer Comment  In the event of cardiac or respiratory ARREST Do not call a "code blue"   In the event of cardiac or respiratory ARREST Do not perform Intubation, CPR, defibrillation or ACLS   In the event of cardiac or respiratory ARREST Use medication by any route, position, wound care, and other measures to relive pain and suffering. May use oxygen, suction and manual treatment of airway obstruction as needed for comfort.      02/22/16 1408    Code Status History    Date Active Date Inactive Code Status Order ID Comments User Context   02/21/2016  6:35 PM 02/22/2016  2:08 PM Full Code [id number]  Altamese DillingVaibhavkumar Vachhani, MD Inpatient   01/22/2016  6:13 PM 01/22/2016 11:49 PM Full Code [id number]  Auburn BilberryShreyang Patel, MD ED   01/12/2016  3:28 AM 01/14/2016  2:43 PM Full Code [id number]  Enedina FinnerSona Patel, MD ED     Disposition Plan: Continue IV antibiotics as long as possible  Consultants:  Podiatry  Infectious disease  Antibiotics:  Ancef  Time spent: 25 minutes  Alford HighlandWIETING, Silvina Hackleman  [facility]

## 2016-02-24 NOTE — Progress Notes (Signed)
MD notified of pt request for indigestion medication, Protonix 40mg  daily ordered

## 2016-02-24 NOTE — Progress Notes (Signed)
Pt notified of policy regarding leaving the unit. W/C removed from room. Pt verbalized understanding, will notify nurse if she would like to leave the floor, needs to sign out and get pass before leaving, cannot leave if IV in progress or scheduled within 1 hour.  Nursing supervisor was notified by charge nurse of incident.

## 2016-02-24 NOTE — Progress Notes (Signed)
Several attempts to locate pt. Staff stated she may be outside in W/C with family.  Vancomycin infusion was due at 1000. Will continue to monitor/attempt to locate pt.

## 2016-02-24 NOTE — Progress Notes (Signed)
Pt off floor. Pt did not sign out when leaving. Unable to give medications at scheduled time. Notified charge nurse.

## 2016-02-24 NOTE — Progress Notes (Signed)
MD notified of pt request for prn indigestion med, MD ordered maalox 10ml q 6hr prn

## 2016-02-25 ENCOUNTER — Inpatient Hospital Stay
Admit: 2016-02-25 | Discharge: 2016-02-25 | Disposition: A | Discharge status: Home / Self Care | Payer: Medicaid Other | Attending: Internal Medicine | Admitting: Internal Medicine

## 2016-02-25 LAB — TYPE AND SCREEN
ABO/RH(D): A POS
Antibody Screen: NEGATIVE

## 2016-02-25 LAB — GLUCOSE, CAPILLARY
GLUCOSE-CAPILLARY: 102 mg/dL — AB (ref 65–99)
Glucose-Capillary: 162 mg/dL — ABNORMAL HIGH (ref 65–99)
Glucose-Capillary: 121 mg/dL — ABNORMAL HIGH (ref 65–99)
Glucose-Capillary: 161 mg/dL — ABNORMAL HIGH (ref 65–99)

## 2016-02-25 LAB — BASIC METABOLIC PANEL
Anion gap: 7 (ref 5–15)
BUN: 10 mg/dL (ref 6–20)
CHLORIDE: 106 mmol/L (ref 101–111)
CO2: 23 mmol/L (ref 22–32)
Calcium: 8.7 mg/dL — ABNORMAL LOW (ref 8.9–10.3)
Creatinine, Ser: 0.8 mg/dL (ref 0.44–1.00)
GFR calc Af Amer: >60 mL/min (ref >60)
GLUCOSE: 142 mg/dL — AB (ref 65–99)
POTASSIUM: 4 mmol/L (ref 3.5–5.1)
Sodium: 136 mmol/L (ref 135–145)

## 2016-02-25 LAB — CBC
HCT: 21.8 % — ABNORMAL LOW (ref 35.0–47.0)
Hemoglobin: 7.1 g/dL — ABNORMAL LOW (ref 12.0–16.0)
MCH: 26.7 pg (ref 26.0–34.0)
MCHC: 32.9 g/dL (ref 32.0–36.0)
MCV: 81.3 fL (ref 80.0–100.0)
PLATELETS: 409 10*3/uL (ref 150–440)
RBC: 2.67 MIL/uL — AB (ref 3.80–5.20)
RDW: 15.4 % — ABNORMAL HIGH (ref 11.5–14.5)
WBC: 10.8 10*3/uL (ref 3.6–11.0)

## 2016-02-25 LAB — ABO/RH: ABO/RH(D): A POS

## 2016-02-25 MED ORDER — FLUTICASONE PROPIONATE 50 MCG/ACT NA SUSP
2.0000 | Freq: Every day | NASAL | Status: DC
  Administered 2016-02-25: 2 via NASAL
  Filled 2016-02-25: qty 16

## 2016-02-25 NOTE — Progress Notes (Signed)
Patient ID: Jackie Miles, female   DOB: 18-Oct-1965, 50 y.o.   MRN: [id number] [facility] PROGRESS NOTE  Jackie Miles Jackie Miles DOB: 1966-09-24 DOA: 02/21/2016 PCP: Luc[facility]  HPI/Subjective: Patient feels well and offers no complaints. Just slight pain in her foot. She is considering surgery but has not fully made up her mind at this point.  Objective: Filed Vitals:   02/25/16 0342 02/25/16 0739  BP: 106/53 91/45  Pulse: 88 85  Temp: 97.5 F (36.4 C) 98.1 F (36.7 C)  Resp: 18 18    Filed Weights   02/21/16 1326  Weight: 87.091 kg (192 lb)    ROS: Review of Systems  Constitutional: Negative for fever and chills.  Eyes: Negative for blurred vision.  Respiratory: Negative for cough and shortness of breath.   Cardiovascular: Negative for chest pain.  Gastrointestinal: Negative for nausea, vomiting and abdominal pain.  Genitourinary: Negative for dysuria.  Musculoskeletal: Positive for joint pain.  Neurological: Negative for dizziness and headaches.   Exam: Physical Exam  Constitutional: She is oriented to person, place, and time.  HENT:  Nose: No mucosal edema.  Mouth/Throat: No oropharyngeal exudate or posterior oropharyngeal edema.  Eyes: Conjunctivae, EOM and lids are normal. Pupils are equal, round, and reactive to light.  Neck: No JVD present. Carotid bruit is not present. No edema present. No thyroid mass and no thyromegaly present.  Cardiovascular: S1 normal and S2 normal.  Exam reveals no gallop.   No murmur heard. Pulses:      Dorsalis pedis pulses are 2+ on the right side, and 2+ on the left side.  Respiratory: No respiratory distress. She has no wheezes. She has no rhonchi. She has no rales.  Musculoskeletal:       Right ankle: She exhibits no swelling.       Left ankle: She exhibits no swelling.  Lymphadenopathy:    She has no cervical adenopathy.  Neurological: She is alert and oriented to person, place, and time. No cranial nerve  deficit.  Skin: Skin is warm. Nails show no clubbing.  Right bottom of foot ulcer size of a quarter with blackish discoloration over the bottom of the foot. Top of the foot on the lateral side large blister that popped with erythema surrounding.  Psychiatric: She has a normal mood and affect.      Data Reviewed: Basic Metabolic Panel:  Recent Labs Lab 02/21/16 1329 02/22/16 0454 02/23/16 0643 02/24/16 0418 02/25/16 0424  NA 129* 130* 134* 135 136  K 3.8 3.8 3.5 3.4* 4.0  CL 98* 102 105 105 106  CO2 23 21* 22 22 23   GLUCOSE 235* 226* 111* 103* 142*  BUN 13 13 10 9 10   CREATININE 1.09* 0.95 0.96 0.92 0.80  CALCIUM 8.4* 8.1* 7.9* 8.4* 8.7*   Liver Function Tests:  Recent Labs Lab 02/21/16 1329  AST 18  ALT 13*  ALKPHOS 90  BILITOT 1.2  PROT 7.7  ALBUMIN 2.9*   CBC:  Recent Labs Lab 02/21/16 1329 02/22/16 0454 02/23/16 0643 02/24/16 0418 02/25/16 0424  WBC 20.4* 15.6* 12.7* 11.9* 10.8  NEUTROABS 16.9*  --  11.0*  --   --   HGB 8.3* 7.2* 6.5* 7.1* 7.1*  HCT 25.7* 21.9* 19.3* 21.2* 21.8*  MCV 82.4 80.8 80.2 81.1 81.3  PLT 403 329 342 386 409    CBG:  Recent Labs Lab 02/24/16 1226 02/24/16 1557 02/24/16 2125 02/25/16 0739 02/25/16 1115  GLUCAP 176* 190* 166* 102* 162*  Recent Results (from the past 240 hour(s))  Blood Culture (routine x 2)     Status: Abnormal (Preliminary result)   Collection Time: 02/21/16  1:29 PM  Result Value Ref Range Status   Specimen Description BLOOD RIGHT AC  Final   Special Requests   Final    BOTTLES DRAWN AEROBIC AND ANAEROBIC AER ANA   Culture  Setup Time   Final    GRAM POSITIVE COCCI AEROBIC BOTTLE ONLY CRITICAL RESULT CALLED TO, READ BACK BY AND VERIFIED WITH: NATE COOKSON ON 02/23/16 AT 0400 BY TLB CONFIRMED BY TLB/CAF    Culture STAPHYLOCOCCUS AUREUS AEROBIC BOTTLE ONLY  (A)  Final   Report Status PENDING  Incomplete   Organism ID, Bacteria STAPHYLOCOCCUS AUREUS  Final      Susceptibility    Staphylococcus aureus - MIC*    CIPROFLOXACIN >=8 RESISTANT Resistant     ERYTHROMYCIN >=8 RESISTANT Resistant     GENTAMICIN <=0.5 SENSITIVE Sensitive     OXACILLIN 0.5 SENSITIVE Sensitive     TETRACYCLINE <=1 SENSITIVE Sensitive     VANCOMYCIN 1 SENSITIVE Sensitive     TRIMETH/SULFA <=10 SENSITIVE Sensitive     CLINDAMYCIN >=8 RESISTANT Resistant     RIFAMPIN <=0.5 SENSITIVE Sensitive     Inducible Clindamycin NEGATIVE Sensitive     * STAPHYLOCOCCUS AUREUS  Blood Culture ID Panel (Reflexed)     Status: Abnormal   Collection Time: 02/21/16  1:29 PM  Result Value Ref Range Status   Enterococcus species NOT DETECTED NOT DETECTED Final   Vancomycin resistance NOT DETECTED NOT DETECTED Final   Listeria monocytogenes NOT DETECTED NOT DETECTED Final   Staphylococcus species DETECTED (A) NOT DETECTED Final    Comment: CRITICAL RESULT CALLED TO, READ BACK BY AND VERIFIED WITH: NATE COOKSON ON 02/23/16 AT 0400 BY TLB    Staphylococcus aureus DETECTED (A) NOT DETECTED Final    Comment: CRITICAL RESULT CALLED TO, READ BACK BY AND VERIFIED WITH: NATE COOKSON ON 02/23/16 AT 0400 BY TLB    Methicillin resistance NOT DETECTED NOT DETECTED Final   Streptococcus species NOT DETECTED NOT DETECTED Final   Streptococcus agalactiae NOT DETECTED NOT DETECTED Final   Streptococcus pneumoniae NOT DETECTED NOT DETECTED Final   Streptococcus pyogenes NOT DETECTED NOT DETECTED Final   Acinetobacter baumannii NOT DETECTED NOT DETECTED Final   Enterobacteriaceae species NOT DETECTED NOT DETECTED Final   Enterobacter cloacae complex NOT DETECTED NOT DETECTED Final   Escherichia coli NOT DETECTED NOT DETECTED Final   Klebsiella oxytoca NOT DETECTED NOT DETECTED Final   Klebsiella pneumoniae NOT DETECTED NOT DETECTED Final   Proteus species NOT DETECTED NOT DETECTED Final   Serratia marcescens NOT DETECTED NOT DETECTED Final   Carbapenem resistance NOT DETECTED NOT DETECTED Final   Haemophilus influenzae NOT  DETECTED NOT DETECTED Final   Neisseria meningitidis NOT DETECTED NOT DETECTED Final   Pseudomonas aeruginosa NOT DETECTED NOT DETECTED Final   Candida albicans NOT DETECTED NOT DETECTED Final   Candida glabrata NOT DETECTED NOT DETECTED Final   Candida krusei NOT DETECTED NOT DETECTED Final   Candida parapsilosis NOT DETECTED NOT DETECTED Final   Candida tropicalis NOT DETECTED NOT DETECTED Final  Blood Culture (routine x 2)     Status: None (Preliminary result)   Collection Time: 02/21/16  2:25 PM  Result Value Ref Range Status   Specimen Description BLOOD  RT [facility]  Final   Special Requests BOTTLES DRAWN AEROBIC AND ANAEROBIC 10CC  Final   Culture  NO GROWTH 4 DAYS  Final   Report Status PENDING  Incomplete  Urine culture     Status: Abnormal   Collection Time: 02/21/16  2:25 PM  Result Value Ref Range Status   Specimen Description URINE, RANDOM  Final   Special Requests NONE  Final   Culture MULTIPLE SPECIES PRESENT, SUGGEST RECOLLECTION (A)  Final   Report Status 02/23/2016 FINAL  Final  Culture, blood (Routine X 2) w Reflex to ID Panel     Status: None (Preliminary result)   Collection Time: 02/23/16  1:15 PM  Result Value Ref Range Status   Specimen Description BLOOD RIGHT HAND  Final   Special Requests BOTTLES DRAWN AEROBIC AND ANAEROBIC 9CCAERO,8CCANA  Final   Culture NO GROWTH 2 DAYS  Final   Report Status PENDING  Incomplete     Scheduled Meds: . amitriptyline  100 mg Oral QHS  . aspirin EC  81 mg Oral Daily  . atorvastatin  20 mg Oral QHS  .  ceFAZolin (ANCEF) IV  2 g Intravenous Q8H  . ferrous sulfate  325 mg Oral BID WC  . fluticasone  2 spray Each Nare Daily  . gabapentin  800 mg Oral q morning - 10a   And  . gabapentin  1,600 mg Oral QHS  . heparin  5,000 Units Subcutaneous Q8H  . insulin aspart  0-9 Units Subcutaneous TID WC  . insulin glargine  20 Units Subcutaneous QHS  . loratadine  10 mg Oral Daily  . metFORMIN  1,000 mg Oral BID WC  . naproxen  500 mg  Oral BID WC  . pantoprazole  40 mg Oral QAC breakfast  . vitamin B-12  1,000 mcg Oral Daily    Assessment/Plan:  1. Sepsis due to right foot abscess and likely osteomyelitis. Patient refuses any surgical intervention. Patient is on IV Ancef for MSSA growing in blood cultures. The patient at least now is considering a surgical intervention. I will not consult vascular surgery until I know for sure that she will have the procedure. I told her that IV antibiotics would not cure this infection. So far repeat blood cultures are negative. 2. Type 2 diabetes mellitus. Continue home medications and sliding scale 3. Essential hypertension. Metoprolol on hold secondary to low blood pressure 4. Chronic anemia. This is anemia of chronic disease. Patient may end up needing a transfusion while here in the hospital. I started first sulfate yesterday. 5. Diabetic neuropathy on gabapentin 6. Hyperlipidemia unspecified on atorvastatin 7. Tobacco abuse  Code Status:     Code Status Orders        Start     Ordered   02/22/16 1409  Do not attempt resuscitation (DNR)   Continuous    Question Answer Comment  In the event of cardiac or respiratory ARREST Do not call a "code blue"   In the event of cardiac or respiratory ARREST Do not perform Intubation, CPR, defibrillation or ACLS   In the event of cardiac or respiratory ARREST Use medication by any route, position, wound care, and other measures to relive pain and suffering. May use oxygen, suction and manual treatment of airway obstruction as needed for comfort.      02/22/16 1408    Code Status History    Date Active Date Inactive Code Status Order ID Comments User Context   02/21/2016  6:35 PM 02/22/2016  2:08 PM Full Code [id number]  Altamese Dilling, MD Inpatient   01/22/2016  6:13 PM 01/22/2016 11:49 PM  Full Code [id number]  Auburn BilberryShreyang Patel, MD ED   01/12/2016  3:28 AM 01/14/2016  2:43 PM Full Code [id number]  Enedina FinnerSona Patel, MD ED     Disposition Plan:  Continue IV antibiotics as long as possible  Consultants:  Podiatry  Antibiotics:  Ancef  Time spent: 22 minutes  Alford HighlandWIETING, Oda Lansdowne  [employer]

## 2016-02-26 LAB — ECHOCARDIOGRAM COMPLETE
HEIGHTINCHES: 68 in
WEIGHTICAEL: 3072 [oz_av]

## 2016-02-26 LAB — GLUCOSE, CAPILLARY: Glucose-Capillary: 144 mg/dL — ABNORMAL HIGH (ref 65–99)

## 2016-02-26 LAB — CULTURE, BLOOD (ROUTINE X 2): Culture: NO GROWTH

## 2016-02-26 MED ORDER — CEPHALEXIN 500 MG PO CAPS
500.0000 mg | ORAL_CAPSULE | Freq: Three times a day (TID) | ORAL | Status: DC
Start: 2016-02-26 — End: 2017-02-14

## 2016-02-26 MED ORDER — FERROUS SULFATE 325 (65 FE) MG PO TABS: 325.0000 mg | ORAL_TABLET | Freq: Two times a day (BID) | ORAL | Status: DC

## 2016-02-26 NOTE — Progress Notes (Signed)
Patient ID: Jackie ArgyleCathy D Demers, female   DOB: [birth year], 50 y.o.   MRN: [id number]  Patient told the nurse that she is going to discharge herself today. The patient has refused any surgical intervention at this point. I recommended further IV antibiotics. Patient states that she is going to leave today. I will prescribe Keflex 500 mg 3 times a day for 3 weeks. I told her that I do not think my antibiotics will help her. Risk of death explained. Patient will need surgical intervention likely a BKA to prevent sepsis and death. Likely will end up needing lifelong antibiotics if she decides not to have surgical intervention.  Fourth and fifth toe now demarcating and starting to turn gangrenous. Swelling on the top of the foot with surrounding erythema. Blackish skin seen on the bottom of the foot where there is an ulcer about the size of a quarter. Also blackish skin seen under the third and fourth toes on the foot.  Patient can sign out AGAINST MEDICAL ADVICE.  Dictated by Dr. Alford Highlandichard Angelamarie Avakian. Discharge summary will be dictated after she physically leaves the hospital.  Time spent on care today 32 minutes.

## 2016-02-26 NOTE — Discharge Summary (Signed)
Discharge summary [facility] - [place] [facility], [place]   PATIENT NAME: Jackie Miles    MR#:  [id number]  DATE OF BIRTH:  1965-12-25  DATE OF ADMISSION:  02/21/2016 ADMITTING PHYSICIAN: Altamese Dilling, MD  DATE OF signing out AGAINST MEDICAL ADVICE: 02/26/2016 12:21 PM  PRIMARY CARE PHYSICIAN: [facility]    ADMISSION DIAGNOSIS:  Diabetic foot infection (HCC) [E11.69, L08.9] Sepsis, due to unspecified organism (HCC) [A41.9]  DISCHARGE DIAGNOSIS:  Active Problems:   Osteomyelitis (HCC)   SECONDARY DIAGNOSIS:   Past Medical History  Diagnosis Date  . Fibromyalgia   . Diabetes mellitus without complication (HCC)   . Hypertension   . Asthma   . H/O percutaneous left heart catheterization     HOSPITAL COURSE:   1. Sepsis due to right foot abscess and likely osteomyelitis. Starting of gangrene seen. Patient refused surgical intervention the entire hospital course. Patient initially started on aggressive antibiotics and was titrated down to IV Ancef for MSSA growing in blood cultures. Repeat blood cultures were negative. I will switch over to by mouth Keflex upon signing out AGAINST MEDICAL ADVICE and I will give a 3 week course. I advised to the patient that antibiotics alone will not handle her problem. She will need an amputation. Patient understands the risk of further sepsis and death. She still signed out AGAINST MEDICAL ADVICE. 2. Type 2 diabetes mellitus I continued her home regimen 3. Essential hypertension. Can go back on usual medications as outpatient 4. Chronic anemia. I prescribed ferrous sulfate 5. Diabetic neuropathy on gabapentin 6. Hyperlipidemia unspecified on atorvastatin 7 tobacco abuse  DISCHARGE CONDITIONS:   Guarded  CONSULTS OBTAINED:  Treatment Team:  Linus Galas, DPM  DRUG ALLERGIES:   Allergies  Allergen Reactions  . Ibuprofen Other (See Comments)    Reaction:  Acid reflux     DISCHARGE MEDICATIONS:    Discharge Medication List as of 02/26/2016 12:21 PM    START taking these medications   Details  cephALEXin (KEFLEX) 500 MG capsule Take 1 capsule (500 mg total) by mouth 3 (three) times daily., Starting 02/26/2016, Until Discontinued, Print    ferrous sulfate 325 (65 FE) MG tablet Take 1 tablet (325 mg total) by mouth 2 (two) times daily with a meal., Starting 02/26/2016, Until Discontinued, Print      CONTINUE these medications which have NOT CHANGED   Details  albuterol (PROVENTIL HFA;VENTOLIN HFA) 108 (90 Base) MCG/ACT inhaler Inhale 2 puffs into the lungs every 6 (six) hours as needed for wheezing or shortness of breath., Until Discontinued, Historical Med    amitriptyline (ELAVIL) 100 MG tablet Take 100 mg by mouth at bedtime., Until Discontinued, Historical Med    atorvastatin (LIPITOR) 20 MG tablet Take 20 mg by mouth at bedtime. , Until Discontinued, Historical Med    cetirizine (ZYRTEC) 10 MG tablet Take 10 mg by mouth at bedtime. , Until Discontinued, Historical Med    fluticasone (FLONASE) 50 MCG/ACT nasal spray Place 2 sprays into both nostrils daily as needed for rhinitis. , Until Discontinued, Historical Med    gabapentin (NEURONTIN) 800 MG tablet Take 800-1,600 mg by mouth 2 (two) times daily. Pt takes one tablet in the morning and two tablets at bedtime., Until Discontinued, Historical Med    insulin aspart (NOVOLOG FLEXPEN) 100 UNIT/ML FlexPen Inject 7 Units into the skin 3 (three) times daily with meals., Until Discontinued, Historical Med    insulin glargine (LANTUS) 100 unit/mL SOPN Inject 20 Units into the skin at  bedtime., Until Discontinued, Historical Med    metFORMIN (GLUCOPHAGE) 1000 MG tablet Take 1,000 mg by mouth 2 (two) times daily with a meal., Until Discontinued, Historical Med    metoprolol tartrate (LOPRESSOR) 25 MG tablet Take 12.5 mg by mouth 2 (two) times daily. , Until Discontinued, Historical Med    vitamin B-12 (CYANOCOBALAMIN) 1000 MCG tablet  Take 1,000 mcg by mouth daily., Until Discontinued, Historical Med      STOP taking these medications     aspirin EC 81 MG tablet      naproxen sodium (ANAPROX) 220 MG tablet      sulfamethoxazole-trimethoprim (BACTRIM DS,SEPTRA DS) 800-160 MG tablet          DISCHARGE INSTRUCTIONS:   Follow up with your medical doctor tomorrow.  If you experience worsening of your admission symptoms, develop shortness of breath, life threatening emergency, suicidal or homicidal thoughts you must seek medical attention immediately by calling 911 or calling your MD immediately  if symptoms less severe.  You Must read complete instructions/literature along with all the possible adverse reactions/side effects for all the Medicines you take and that have been prescribed to you. Take any new Medicines after you have completely understood and accept all the possible adverse reactions/side effects.   Please note  You were cared for by a hospitalist during your hospital stay. If you have any questions about your discharge medications or the care you received while you were in the hospital after you are discharged, you can call the unit and asked to speak with the hospitalist on call if the hospitalist that took care of you is not available. Once you are discharged, your primary care physician will handle any further medical issues. Please note that NO REFILLS for any discharge medications will be authorized once you are discharged, as it is imperative that you return to your primary care physician (or establish a relationship with a primary care physician if you do not have one) for your aftercare needs so that they can reassess your need for medications and monitor your lab values.    Today   CHIEF COMPLAINT:   Chief Complaint  Patient presents with  . Foot Pain  . Fever    HISTORY OF PRESENT ILLNESS:  Jackie Miles  is a 50 y.o. female Presented with foot pain and fever and found to have  sepsis.   VITAL SIGNS:  Blood pressure 120/53, pulse 95, temperature 98.7 F (37.1 C), temperature source Oral, resp. rate 18, height 5\' 8"  (1.727 m), weight 87.091 kg (192 lb), SpO2 100 %.    PHYSICAL EXAMINATION:  GENERAL:  50 y.o.-year-old patient lying in the bed with no acute distress.  EYES: Pupils equal, round, reactive to light and accommodation. HEENT: Head atraumatic, normocephalic.  LUNGS: Normal breath sounds bilaterally, no wheezing, rales,rhonchi or crepitation. No use of accessory muscles of respiration.  CARDIOVASCULAR: S1, S2 normal. 2/6 sytolic murmurs, NO rubs, or gallops.  EXTREMITIES: right foot pedal edema, No cyanosis, or clubbing.  PSYCHIATRIC: The patient is alert and oriented x 3.  SKIN: Right foot fourth and fifth toes gangrenous with gangrene underneath the foot. Large area of blackness around the ulcer on the bottom of the foot. Top of the foot with swelling and abscess formation and erythema surrounding.   DATA REVIEW:   CBC  Recent Labs Lab 02/25/16 0424  WBC 10.8  HGB 7.1*  HCT 21.8*  PLT 409    Chemistries   Recent Labs Lab 02/21/16 1329  02/25/16 0424  NA 129*  < > 136  K 3.8  < > 4.0  CL 98*  < > 106  CO2 23  < > 23  GLUCOSE 235*  < > 142*  BUN 13  < > 10  CREATININE 1.09*  < > 0.80  CALCIUM 8.4*  < > 8.7*  AST 18  --   --   ALT 13*  --   --   ALKPHOS 90  --   --   BILITOT 1.2  --   --   < > = values in this interval not displayed.  Microbiology Results  Results for orders placed or performed during the hospital encounter of 02/21/16  Blood Culture (routine x 2)     Status: Abnormal   Collection Time: 02/21/16  1:29 PM  Result Value Ref Range Status   Specimen Description BLOOD RIGHT AC  Final   Special Requests   Final    BOTTLES DRAWN AEROBIC AND ANAEROBIC AER ANA   Culture  Setup Time   Final    GRAM POSITIVE COCCI AEROBIC BOTTLE ONLY CRITICAL RESULT CALLED TO, READ BACK BY AND VERIFIED WITH: NATE COOKSON ON  02/23/16 AT 0400 BY TLB CONFIRMED BY TLB/CAF    Culture STAPHYLOCOCCUS AUREUS AEROBIC BOTTLE ONLY  (A)  Final   Report Status 02/26/2016 FINAL  Final   Organism ID, Bacteria STAPHYLOCOCCUS AUREUS  Final      Susceptibility   Staphylococcus aureus - MIC*    CIPROFLOXACIN >=8 RESISTANT Resistant     ERYTHROMYCIN >=8 RESISTANT Resistant     GENTAMICIN <=0.5 SENSITIVE Sensitive     OXACILLIN 0.5 SENSITIVE Sensitive     TETRACYCLINE <=1 SENSITIVE Sensitive     VANCOMYCIN 1 SENSITIVE Sensitive     TRIMETH/SULFA <=10 SENSITIVE Sensitive     CLINDAMYCIN >=8 RESISTANT Resistant     RIFAMPIN <=0.5 SENSITIVE Sensitive     Inducible Clindamycin NEGATIVE Sensitive     * STAPHYLOCOCCUS AUREUS  Blood Culture ID Panel (Reflexed)     Status: Abnormal   Collection Time: 02/21/16  1:29 PM  Result Value Ref Range Status   Enterococcus species NOT DETECTED NOT DETECTED Final   Vancomycin resistance NOT DETECTED NOT DETECTED Final   Listeria monocytogenes NOT DETECTED NOT DETECTED Final   Staphylococcus species DETECTED (A) NOT DETECTED Final    Comment: CRITICAL RESULT CALLED TO, READ BACK BY AND VERIFIED WITH: NATE COOKSON ON 02/23/16 AT 0400 BY TLB    Staphylococcus aureus DETECTED (A) NOT DETECTED Final    Comment: CRITICAL RESULT CALLED TO, READ BACK BY AND VERIFIED WITH: NATE COOKSON ON 02/23/16 AT 0400 BY TLB    Methicillin resistance NOT DETECTED NOT DETECTED Final   Streptococcus species NOT DETECTED NOT DETECTED Final   Streptococcus agalactiae NOT DETECTED NOT DETECTED Final   Streptococcus pneumoniae NOT DETECTED NOT DETECTED Final   Streptococcus pyogenes NOT DETECTED NOT DETECTED Final   Acinetobacter baumannii NOT DETECTED NOT DETECTED Final   Enterobacteriaceae species NOT DETECTED NOT DETECTED Final   Enterobacter cloacae complex NOT DETECTED NOT DETECTED Final   Escherichia coli NOT DETECTED NOT DETECTED Final   Klebsiella oxytoca NOT DETECTED NOT DETECTED Final   Klebsiella  pneumoniae NOT DETECTED NOT DETECTED Final   Proteus species NOT DETECTED NOT DETECTED Final   Serratia marcescens NOT DETECTED NOT DETECTED Final   Carbapenem resistance NOT DETECTED NOT DETECTED Final   Haemophilus influenzae NOT DETECTED NOT DETECTED Final   Neisseria meningitidis  NOT DETECTED NOT DETECTED Final   Pseudomonas aeruginosa NOT DETECTED NOT DETECTED Final   Candida albicans NOT DETECTED NOT DETECTED Final   Candida glabrata NOT DETECTED NOT DETECTED Final   Candida krusei NOT DETECTED NOT DETECTED Final   Candida parapsilosis NOT DETECTED NOT DETECTED Final   Candida tropicalis NOT DETECTED NOT DETECTED Final  Blood Culture (routine x 2)     Status: None   Collection Time: 02/21/16  2:25 PM  Result Value Ref Range Status   Specimen Description BLOOD  RT [facility]  Final   Special Requests BOTTLES DRAWN AEROBIC AND ANAEROBIC 10CC  Final   Culture NO GROWTH 5 DAYS  Final   Report Status 02/26/2016 FINAL  Final  Urine culture     Status: Abnormal   Collection Time: 02/21/16  2:25 PM  Result Value Ref Range Status   Specimen Description URINE, RANDOM  Final   Special Requests NONE  Final   Culture MULTIPLE SPECIES PRESENT, SUGGEST RECOLLECTION (A)  Final   Report Status 02/23/2016 FINAL  Final  Culture, blood (Routine X 2) w Reflex to ID Panel     Status: None (Preliminary result)   Collection Time: 02/23/16  1:15 PM  Result Value Ref Range Status   Specimen Description BLOOD RIGHT HAND  Final   Special Requests BOTTLES DRAWN AEROBIC AND ANAEROBIC 9CCAERO,8CCANA  Final   Culture NO GROWTH 3 DAYS  Final   Report Status PENDING  Incomplete    Management plans discussed with the patient, family and they are in agreement.  CODE STATUS:  Code Status History    Date Active Date Inactive Code Status Order ID Comments User Context   02/22/2016  2:08 PM 02/26/2016  3:22 PM DNR [id number]  Milagros LollSrikar Sudini, MD Inpatient   02/21/2016  6:35 PM 02/22/2016  2:08 PM Full Code [id number]   Altamese DillingVaibhavkumar Vachhani, MD Inpatient   01/22/2016  6:13 PM 01/22/2016 11:49 PM Full Code [id number]  Auburn BilberryShreyang Patel, MD ED   01/12/2016  3:28 AM 01/14/2016  2:43 PM Full Code [id number]  Enedina FinnerSona Patel, MD ED    Questions for Most Recent Historical Code Status (Order [id number])    Question Answer Comment   In the event of cardiac or respiratory ARREST Do not call a "code blue"    In the event of cardiac or respiratory ARREST Do not perform Intubation, CPR, defibrillation or ACLS    In the event of cardiac or respiratory ARREST Use medication by any route, position, wound care, and other measures to relive pain and suffering. May use oxygen, suction and manual treatment of airway obstruction as needed for comfort.       TOTAL TIME TAKING CARE OF THIS PATIENT: 32 minutes.    Alford HighlandWIETING, Bryanne Riquelme M.D on 02/26/2016 at 4:18 PM  Between 7am to 6pm - Pager - [phone]  After 6pm go to [email or web address] - Social research officer, governmentpassword [email or web address]  [facility] Office  [phone]  CC: Primary care physician; [facility]

## 2016-02-28 LAB — CULTURE, BLOOD (ROUTINE X 2): Culture: NO GROWTH

## 2016-03-23 ENCOUNTER — Encounter: Payer: Self-pay | Admitting: *Deleted

## 2016-03-23 ENCOUNTER — Emergency Department
Admission: EM | Admit: 2016-03-23 | Discharge: 2016-03-23 | Disposition: A | Discharge status: Home / Self Care | Payer: Medicaid Other | Attending: Emergency Medicine | Admitting: Emergency Medicine

## 2016-03-23 DIAGNOSIS — Z794 Long term (current) use of insulin: Secondary | ICD-10-CM | POA: Diagnosis not present

## 2016-03-23 DIAGNOSIS — Y69 Unspecified misadventure during surgical and medical care: Secondary | ICD-10-CM | POA: Insufficient documentation

## 2016-03-23 DIAGNOSIS — T82868A Thrombosis of vascular prosthetic devices, implants and grafts, initial encounter: Secondary | ICD-10-CM | POA: Insufficient documentation

## 2016-03-23 DIAGNOSIS — F17200 Nicotine dependence, unspecified, uncomplicated: Secondary | ICD-10-CM | POA: Diagnosis not present

## 2016-03-23 DIAGNOSIS — I10 Essential (primary) hypertension: Secondary | ICD-10-CM | POA: Insufficient documentation

## 2016-03-23 DIAGNOSIS — Z79899 Other long term (current) drug therapy: Secondary | ICD-10-CM | POA: Diagnosis not present

## 2016-03-23 DIAGNOSIS — Z7984 Long term (current) use of oral hypoglycemic drugs: Secondary | ICD-10-CM | POA: Insufficient documentation

## 2016-03-23 DIAGNOSIS — T82898A Other specified complication of vascular prosthetic devices, implants and grafts, initial encounter: Secondary | ICD-10-CM

## 2016-03-23 DIAGNOSIS — E119 Type 2 diabetes mellitus without complications: Secondary | ICD-10-CM | POA: Insufficient documentation

## 2016-03-23 DIAGNOSIS — J45909 Unspecified asthma, uncomplicated: Secondary | ICD-10-CM | POA: Insufficient documentation

## 2016-03-23 NOTE — ED Notes (Signed)
Pt states she could not get her PICC line to flush this AM, pt giving her self abx after right foot surgrey

## 2016-03-23 NOTE — ED Notes (Signed)
Pts picc line had blood in line. No cap on. Flushed, heparanized, cap on.

## 2016-03-23 NOTE — ED Provider Notes (Signed)
[facility] Emergency Department Provider Note        Time seen: ----------------------------------------- 10:39 AM on 03/23/2016 -----------------------------------------    I have reviewed the triage vital signs and the nursing notes.   HISTORY  Chief Complaint Vascular Access Problem    HPI Jackie Miles is a 50 y.o. female who presents the ER stating that her PICC line would not flush this morning. She's been giving herself antibiotics via PICC line after right foot surgery. She denies fevers chills or other complaints, denies any arm pain. She was working well normally yesterday.   Past Medical History  Diagnosis Date  . Fibromyalgia   . Diabetes mellitus without complication (HCC)   . Hypertension   . Asthma   . H/O percutaneous left heart catheterization     Patient Active Problem List   Diagnosis Date Noted  . Osteomyelitis (HCC) 02/21/2016  . Foot infection 01/22/2016  . Sepsis (HCC) 01/12/2016    History reviewed. No pertinent past surgical history.  Allergies Ibuprofen  Social History Social History  Substance Use Topics  . Smoking status: Current Every Day Smoker -- 0.50 packs/day  . Smokeless tobacco: None  . Alcohol Use: No    Review of Systems Constitutional: Negative for fever. Cardiovascular: Negative for chest pain. Respiratory: Negative for shortness of breath. Musculoskeletal: Negative for back pain, negative for right arm pain Skin: Negative for rash. Neurological: Negative for headaches, focal weakness or numbness.   ____________________________________________   PHYSICAL EXAM:  VITAL SIGNS: ED Triage Vitals  Enc Vitals Group     BP 03/23/16 1035 117/60 mmHg     Pulse Rate 03/23/16 1035 98     Resp 03/23/16 1035 18     Temp 03/23/16 1035 98.8 F (37.1 C)     Temp Source 03/23/16 1035 Oral     SpO2 03/23/16 1035 98 %     Weight 03/23/16 1035 205 lb (92.987 kg)     Height 03/23/16 1035 5\' 8"   (1.727 m)     Head Cir --      Peak Flow --      Pain Score 03/23/16 1035 0     Pain Loc --      Pain Edu? --      Excl. in GC? --     Constitutional: Alert and oriented. Well appearing and in no distress. Eyes: Conjunctivae are normal. Normal extraocular movements. Cardiovascular: Normal rate, regular rhythm. No murmurs, rubs, or gallops. Respiratory: Normal respiratory effort without tachypnea nor retractions. Breath sounds are clear and equal bilaterally. No wheezes/rales/rhonchi. Musculoskeletal: Nontender with normal range of motion in the right arm. She is nontender around the right PICC line insertion site Skin:  Skin is warm, dry and intact. No rash noted. ____________________________________________  ED COURSE:  Pertinent labs & imaging results that were available during my care of the patient were reviewed by me and considered in my medical decision making (see chart for details). Patient is no acute distress, we will attempt to flush her PICC line.  Nursing was able to flush her PICC line without much difficulty. She was given some proper instructions, heparin flush was used. ____________________________________________  FINAL ASSESSMENT AND PLAN  PICC line dysfunction  Plan: Patient presents with inability to flush her PICC line, which is now resolved. She is stable for discharge.   Emily FilbertWilliams, Lynett Brasil E, MD   Note: This dictation was prepared with Dragon dictation. Any transcriptional errors that result from this process are unintentional  Emily FilbertJonathan E Codi Folkerts, MD 03/23/16 1054

## 2016-03-23 NOTE — Discharge Instructions (Signed)
PICC Home Guide A peripherally inserted central catheter (PICC) is a long, thin, flexible tube that is inserted into a vein in the upper arm. It is a form of intravenous (IV) access. It is considered to be a "central" line because the tip of the PICC ends in a large vein in your chest. This large vein is called the superior vena cava (SVC). The PICC tip ends in the SVC because there is a lot of blood flow in the SVC. This allows medicines and IV fluids to be quickly distributed throughout the body. The PICC is inserted using a sterile technique by a specially trained nurse or physician. After the PICC is inserted, a chest X-ray exam is done to be sure it is in the correct place.  A PICC may be placed for different reasons, such as:  To give medicines and liquid nutrition that can only be given through a central line. Examples are:  Certain antibiotic treatments.  Chemotherapy.  Total parenteral nutrition (TPN).  To take frequent blood samples.  To give IV fluids and blood products.  If there is difficulty placing a peripheral intravenous (PIV) catheter. If taken care of properly, a PICC can remain in place for several months. A PICC can also allow a person to go home from the hospital early. Medicine and PICC care can be managed at home by a family member or home health care team. WHAT PROBLEMS CAN HAPPEN WHEN I HAVE A PICC? Problems with a PICC can occasionally occur. These may include the following:  A blood clot (thrombus) forming in or at the tip of the PICC. This can cause the PICC to become clogged. A clot-dissolving medicine called tissue plasminogen activator (tPA) can be given through the PICC to help break up the clot.  Inflammation of the vein (phlebitis) in which the PICC is placed. Signs of inflammation may include redness, pain at the insertion site, red streaks, or being able to feel a "cord" in the vein where the PICC is located.  Infection in the PICC or at the insertion  site. Signs of infection may include fever, chills, redness, swelling, or pus drainage from the PICC insertion site.  PICC movement (malposition). The PICC tip may move from its original position due to excessive physical activity, forceful coughing, sneezing, or vomiting.  A break or cut in the PICC. It is important to not use scissors near the PICC.  Nerve or tendon irritation or injury during PICC insertion. WHAT SHOULD I KEEP IN MIND ABOUT ACTIVITIES WHEN I HAVE A PICC?  You may bend your arm and move it freely. If your PICC is near or at the bend of your elbow, avoid activity with repeated motion at the elbow.  Rest at home for the remainder of the day following PICC line insertion.  Avoid lifting heavy objects as instructed by your health care provider.  Avoid using a crutch with the arm on the same side as your PICC. You may need to use a walker. WHAT SHOULD I KNOW ABOUT MY PICC DRESSING?  Keep your PICC bandage (dressing) clean and dry to prevent infection.  Ask your health care provider when you may shower. Ask your health care provider to teach you how to wrap the PICC when you do take a shower.  Change the PICC dressing as instructed by your health care provider.  Change your PICC dressing if it becomes loose or wet. WHAT SHOULD I KNOW ABOUT PICC CARE?  Check the PICC insertion site   daily for leakage, redness, swelling, or pain.  Do not take a bath, swim, or use hot tubs when you have a PICC. Cover PICC line with clear plastic wrap and tape to keep it dry while showering.  Flush the PICC as directed by your health care provider. Let your health care provider know right away if the PICC is difficult to flush or does not flush. Do not use force to flush the PICC.  Do not use a syringe that is less than 10 mL to flush the PICC.  Never pull or tug on the PICC.  Avoid blood pressure checks on the arm with the PICC.  Keep your PICC identification card with you at all  times.  Do not take the PICC out yourself. Only a trained clinical professional should remove the PICC. SEEK IMMEDIATE MEDICAL CARE IF:  Your PICC is accidentally pulled all the way out. If this happens, cover the insertion site with a bandage or gauze dressing. Do not throw the PICC away. Your health care provider will need to inspect it.  Your PICC was tugged or pulled and has partially come out. Do not  push the PICC back in.  There is any type of drainage, redness, or swelling where the PICC enters the skin.  You cannot flush the PICC, it is difficult to flush, or the PICC leaks around the insertion site when it is flushed.  You hear a "flushing" sound when the PICC is flushed.  You have pain, discomfort, or numbness in your arm, shoulder, or jaw on the same side as the PICC.  You feel your heart "racing" or skipping beats.  You notice a hole or tear in the PICC.  You develop chills or a fever. MAKE SURE YOU:   Understand these instructions.  Will watch your condition.  Will get help right away if you are not doing well or get worse.   This information is not intended to replace advice given to you by your health care provider. Make sure you discuss any questions you have with your health care provider.   Document Released: 03/23/2003 Document Revised: 10/07/2014 Document Reviewed: 05/24/2013 Elsevier Interactive Patient Education 2016 [employer]  

## 2016-08-25 ENCOUNTER — Other Ambulatory Visit
Admission: RE | Admit: 2016-08-25 | Discharge: 2016-08-25 | Disposition: A | Discharge status: Home / Self Care | Payer: Medicaid Other | Source: Ambulatory Visit | Attending: Specialist | Admitting: Specialist

## 2016-08-25 DIAGNOSIS — E1169 Type 2 diabetes mellitus with other specified complication: Secondary | ICD-10-CM | POA: Diagnosis present

## 2016-08-25 DIAGNOSIS — M868X8 Other osteomyelitis, other site: Secondary | ICD-10-CM | POA: Diagnosis not present

## 2016-08-25 DIAGNOSIS — L03115 Cellulitis of right lower limb: Secondary | ICD-10-CM | POA: Insufficient documentation

## 2016-08-25 LAB — CREATININE, SERUM
CREATININE: 0.69 mg/dL (ref 0.44–1.00)
GFR calc Af Amer: >60 mL/min (ref >60)
GFR calc non Af Amer: >60 mL/min (ref >60)

## 2016-08-25 LAB — CBC WITH DIFFERENTIAL/PLATELET
BASOS PCT: 1 %
Basophils Absolute: 0.1 10*3/uL (ref 0–0.1)
EOS PCT: 3 %
Eosinophils Absolute: 0.3 10*3/uL (ref 0–0.7)
HCT: 26.3 % — ABNORMAL LOW (ref 35.0–47.0)
Hemoglobin: 8.8 g/dL — ABNORMAL LOW (ref 12.0–16.0)
LYMPHS ABS: 2.3 10*3/uL (ref 1.0–3.6)
Lymphocytes Relative: 22 %
MCH: 24.9 pg — AB (ref 26.0–34.0)
MCHC: 33.4 g/dL (ref 32.0–36.0)
MCV: 74.7 fL — AB (ref 80.0–100.0)
MONOS PCT: 8 %
Monocytes Absolute: 0.8 10*3/uL (ref 0.2–0.9)
NEUTROS ABS: 6.8 10*3/uL — AB (ref 1.4–6.5)
Neutrophils Relative %: 66 %
PLATELETS: 540 10*3/uL — AB (ref 150–440)
RBC: 3.52 MIL/uL — ABNORMAL LOW (ref 3.80–5.20)
RDW: 18.7 % — AB (ref 11.5–14.5)
WBC: 10.3 10*3/uL (ref 3.6–11.0)

## 2016-08-25 LAB — CK: CK TOTAL: 447 U/L — AB (ref 38–234)

## 2016-08-25 LAB — AST: AST: 28 U/L (ref 15–41)

## 2016-08-25 LAB — BUN: BUN: 8 mg/dL (ref 6–20)

## 2016-08-25 LAB — ALT: ALT: 10 U/L — ABNORMAL LOW (ref 14–54)

## 2016-09-01 ENCOUNTER — Other Ambulatory Visit
Admission: RE | Admit: 2016-09-01 | Discharge: 2016-09-01 | Disposition: A | Discharge status: Home / Self Care | Payer: Medicaid Other | Source: Ambulatory Visit | Attending: Specialist | Admitting: Specialist

## 2016-09-01 DIAGNOSIS — E1169 Type 2 diabetes mellitus with other specified complication: Secondary | ICD-10-CM | POA: Insufficient documentation

## 2016-09-01 LAB — BUN: BUN: 8 mg/dL (ref 6–20)

## 2016-09-01 LAB — CREATININE, SERUM
Creatinine, Ser: 0.73 mg/dL (ref 0.44–1.00)
GFR calc Af Amer: >60 mL/min
GFR calc non Af Amer: >60 mL/min

## 2016-09-01 LAB — ALT: ALT: 19 U/L (ref 14–54)

## 2016-09-01 LAB — CBC WITH DIFFERENTIAL/PLATELET
BASOS ABS: 0.1 10*3/uL (ref 0–0.1)
BASOS PCT: 1 %
EOS ABS: 0.9 10*3/uL — AB (ref 0–0.7)
Eosinophils Relative: 8 %
HCT: 31.8 % — ABNORMAL LOW (ref 35.0–47.0)
HEMOGLOBIN: 10.2 g/dL — AB (ref 12.0–16.0)
Lymphocytes Relative: 23 %
Lymphs Abs: 2.7 10*3/uL (ref 1.0–3.6)
MCH: 24.3 pg — ABNORMAL LOW (ref 26.0–34.0)
MCHC: 32.2 g/dL (ref 32.0–36.0)
MCV: 75.4 fL — ABNORMAL LOW (ref 80.0–100.0)
Monocytes Absolute: 0.8 10*3/uL (ref 0.2–0.9)
Monocytes Relative: 7 %
NEUTROS PCT: 61 %
Neutro Abs: 6.9 10*3/uL — ABNORMAL HIGH (ref 1.4–6.5)
Platelets: 433 10*3/uL (ref 150–440)
RBC: 4.22 MIL/uL (ref 3.80–5.20)
RDW: 18.6 % — ABNORMAL HIGH (ref 11.5–14.5)
WBC: 11.3 10*3/uL — AB (ref 3.6–11.0)

## 2016-09-01 LAB — AST: AST: 24 U/L (ref 15–41)

## 2016-09-08 ENCOUNTER — Other Ambulatory Visit
Admission: RE | Admit: 2016-09-08 | Discharge: 2016-09-08 | Disposition: A | Discharge status: Home / Self Care | Payer: Medicaid Other | Source: Ambulatory Visit | Attending: Specialist | Admitting: Specialist

## 2016-09-08 DIAGNOSIS — L03115 Cellulitis of right lower limb: Secondary | ICD-10-CM | POA: Insufficient documentation

## 2016-09-08 DIAGNOSIS — E1169 Type 2 diabetes mellitus with other specified complication: Secondary | ICD-10-CM | POA: Diagnosis present

## 2016-09-08 DIAGNOSIS — L97513 Non-pressure chronic ulcer of other part of right foot with necrosis of muscle: Secondary | ICD-10-CM | POA: Insufficient documentation

## 2016-09-08 DIAGNOSIS — M868X8 Other osteomyelitis, other site: Secondary | ICD-10-CM | POA: Diagnosis present

## 2016-09-08 LAB — CBC WITH DIFFERENTIAL/PLATELET
Basophils Absolute: 0.1 10*3/uL (ref 0–0.1)
Basophils Relative: 1 %
EOS ABS: 0.8 10*3/uL — AB (ref 0–0.7)
EOS PCT: 6 %
HCT: 34 % — ABNORMAL LOW (ref 35.0–47.0)
Hemoglobin: 11.1 g/dL — ABNORMAL LOW (ref 12.0–16.0)
LYMPHS ABS: 2.8 10*3/uL (ref 1.0–3.6)
Lymphocytes Relative: 23 %
MCH: 24.6 pg — AB (ref 26.0–34.0)
MCHC: 32.5 g/dL (ref 32.0–36.0)
MCV: 75.5 fL — ABNORMAL LOW (ref 80.0–100.0)
MONOS PCT: 7 %
Monocytes Absolute: 0.9 10*3/uL (ref 0.2–0.9)
Neutro Abs: 7.5 10*3/uL — ABNORMAL HIGH (ref 1.4–6.5)
Neutrophils Relative %: 63 %
PLATELETS: 374 10*3/uL (ref 150–440)
RBC: 4.51 MIL/uL (ref 3.80–5.20)
RDW: 19.2 % — AB (ref 11.5–14.5)
WBC: 12 10*3/uL — ABNORMAL HIGH (ref 3.6–11.0)

## 2016-09-08 LAB — BUN: BUN: 13 mg/dL (ref 6–20)

## 2016-09-08 LAB — AST: AST: 21 U/L (ref 15–41)

## 2016-09-08 LAB — ALT: ALT: 12 U/L — AB (ref 14–54)

## 2016-09-08 LAB — CREATININE, SERUM
Creatinine, Ser: 0.87 mg/dL (ref 0.44–1.00)
GFR calc Af Amer: >60 mL/min (ref >60)
GFR calc non Af Amer: >60 mL/min (ref >60)

## 2016-09-08 LAB — CK: Total CK: 45 U/L (ref 38–234)

## 2016-09-24 ENCOUNTER — Other Ambulatory Visit
Admission: RE | Admit: 2016-09-24 | Discharge: 2016-09-24 | Disposition: A | Discharge status: Home / Self Care | Payer: Medicaid Other | Source: Ambulatory Visit | Attending: Specialist | Admitting: Specialist

## 2016-09-24 DIAGNOSIS — L03115 Cellulitis of right lower limb: Secondary | ICD-10-CM | POA: Insufficient documentation

## 2016-09-24 DIAGNOSIS — L97513 Non-pressure chronic ulcer of other part of right foot with necrosis of muscle: Secondary | ICD-10-CM | POA: Diagnosis not present

## 2016-09-24 DIAGNOSIS — E1169 Type 2 diabetes mellitus with other specified complication: Secondary | ICD-10-CM | POA: Diagnosis present

## 2016-09-24 DIAGNOSIS — M868X8 Other osteomyelitis, other site: Secondary | ICD-10-CM | POA: Diagnosis present

## 2016-09-24 LAB — CBC WITH DIFFERENTIAL/PLATELET
BASOS ABS: 0 10*3/uL (ref 0–0.1)
Basophils Relative: 0 %
EOS PCT: 5 %
Eosinophils Absolute: 0.4 10*3/uL (ref 0–0.7)
HEMATOCRIT: 32.8 % — AB (ref 35.0–47.0)
Hemoglobin: 10.8 g/dL — ABNORMAL LOW (ref 12.0–16.0)
LYMPHS PCT: 22 %
Lymphs Abs: 1.9 10*3/uL (ref 1.0–3.6)
MCH: 24.8 pg — AB (ref 26.0–34.0)
MCHC: 32.9 g/dL (ref 32.0–36.0)
MCV: 75.3 fL — AB (ref 80.0–100.0)
MONO ABS: 0.7 10*3/uL (ref 0.2–0.9)
MONOS PCT: 8 %
NEUTROS ABS: 5.4 10*3/uL (ref 1.4–6.5)
Neutrophils Relative %: 65 %
PLATELETS: 354 10*3/uL (ref 150–440)
RBC: 4.35 MIL/uL (ref 3.80–5.20)
RDW: 18 % — AB (ref 11.5–14.5)
WBC: 8.4 10*3/uL (ref 3.6–11.0)

## 2016-09-24 LAB — BUN: BUN: 13 mg/dL (ref 6–20)

## 2016-09-24 LAB — AST: AST: 17 U/L (ref 15–41)

## 2016-09-24 LAB — CREATININE, SERUM
Creatinine, Ser: 0.87 mg/dL (ref 0.44–1.00)
GFR calc non Af Amer: >60 mL/min (ref >60)

## 2016-09-24 LAB — CK: CK TOTAL: 40 U/L (ref 38–234)

## 2016-09-24 LAB — ALT: ALT: 11 U/L — ABNORMAL LOW (ref 14–54)

## 2016-10-28 ENCOUNTER — Other Ambulatory Visit
Admission: RE | Admit: 2016-10-28 | Discharge: 2016-10-28 | Disposition: A | Discharge status: Home / Self Care | Payer: Medicaid Other | Source: Ambulatory Visit | Attending: Specialist | Admitting: Specialist

## 2016-10-28 DIAGNOSIS — M868X8 Other osteomyelitis, other site: Secondary | ICD-10-CM | POA: Insufficient documentation

## 2016-10-28 DIAGNOSIS — E1169 Type 2 diabetes mellitus with other specified complication: Secondary | ICD-10-CM | POA: Diagnosis present

## 2016-10-28 DIAGNOSIS — L03115 Cellulitis of right lower limb: Secondary | ICD-10-CM | POA: Diagnosis present

## 2016-10-28 DIAGNOSIS — L97513 Non-pressure chronic ulcer of other part of right foot with necrosis of muscle: Secondary | ICD-10-CM | POA: Insufficient documentation

## 2016-10-28 LAB — CREATININE, SERUM: Creatinine, Ser: 0.69 mg/dL (ref 0.44–1.00)

## 2016-10-28 LAB — CBC WITH DIFFERENTIAL/PLATELET
BAND NEUTROPHILS: 1 %
BASOS PCT: 0 %
BLASTS: 0 %
Basophils Absolute: 0 10*3/uL (ref 0–0.1)
Eosinophils Absolute: 0.1 10*3/uL (ref 0–0.7)
Eosinophils Relative: 1 %
HCT: 26.2 % — ABNORMAL LOW (ref 35.0–47.0)
HEMOGLOBIN: 8.3 g/dL — AB (ref 12.0–16.0)
LYMPHS PCT: 26 %
Lymphs Abs: 2.6 10*3/uL (ref 1.0–3.6)
MCH: 23.9 pg — AB (ref 26.0–34.0)
MCHC: 31.8 g/dL — AB (ref 32.0–36.0)
MCV: 75.1 fL — AB (ref 80.0–100.0)
MONO ABS: 0.4 10*3/uL (ref 0.2–0.9)
MONOS PCT: 4 %
Metamyelocytes Relative: 2 %
Myelocytes: 6 %
NEUTROS ABS: 6.8 10*3/uL — AB (ref 1.4–6.5)
NEUTROS PCT: 60 %
NRBC: 0 /100{WBCs}
OTHER: 0 %
PROMYELOCYTES ABS: 0 %
Platelets: 724 10*3/uL — ABNORMAL HIGH (ref 150–440)
RBC: 3.49 MIL/uL — ABNORMAL LOW (ref 3.80–5.20)
RDW: 17.5 % — ABNORMAL HIGH (ref 11.5–14.5)
WBC: 9.9 10*3/uL (ref 3.6–11.0)

## 2016-10-28 LAB — BUN: BUN: 6 mg/dL (ref 6–20)

## 2016-10-28 LAB — SEDIMENTATION RATE: Sed Rate: >140 mm/hr — ABNORMAL HIGH (ref 0–30)

## 2016-10-28 LAB — C-REACTIVE PROTEIN: CRP: 2.3 mg/dL — ABNORMAL HIGH (ref <1.0)

## 2016-10-29 LAB — PATHOLOGIST SMEAR REVIEW

## 2016-11-04 ENCOUNTER — Other Ambulatory Visit
Admission: RE | Admit: 2016-11-04 | Discharge: 2016-11-04 | Disposition: A | Discharge status: Home / Self Care | Payer: Medicaid Other | Source: Ambulatory Visit | Attending: Specialist | Admitting: Specialist

## 2016-11-04 DIAGNOSIS — L97513 Non-pressure chronic ulcer of other part of right foot with necrosis of muscle: Secondary | ICD-10-CM | POA: Diagnosis present

## 2016-11-04 DIAGNOSIS — L03115 Cellulitis of right lower limb: Secondary | ICD-10-CM | POA: Diagnosis present

## 2016-11-04 DIAGNOSIS — E1169 Type 2 diabetes mellitus with other specified complication: Secondary | ICD-10-CM | POA: Insufficient documentation

## 2016-11-04 DIAGNOSIS — M868X8 Other osteomyelitis, other site: Secondary | ICD-10-CM | POA: Diagnosis present

## 2016-11-04 LAB — CBC WITH DIFFERENTIAL/PLATELET
BASOS PCT: 1 %
Basophils Absolute: 0 10*3/uL (ref 0–0.1)
EOS ABS: 0.2 10*3/uL (ref 0–0.7)
EOS PCT: 2 %
HCT: 27.1 % — ABNORMAL LOW (ref 35.0–47.0)
Hemoglobin: 9.1 g/dL — ABNORMAL LOW (ref 12.0–16.0)
LYMPHS ABS: 2.7 10*3/uL (ref 1.0–3.6)
Lymphocytes Relative: 28 %
MCH: 25.1 pg — AB (ref 26.0–34.0)
MCHC: 33.5 g/dL (ref 32.0–36.0)
MCV: 74.8 fL — ABNORMAL LOW (ref 80.0–100.0)
Monocytes Absolute: 0.9 10*3/uL (ref 0.2–0.9)
Monocytes Relative: 10 %
Neutro Abs: 5.8 10*3/uL (ref 1.4–6.5)
Neutrophils Relative %: 61 %
PLATELETS: 465 10*3/uL — AB (ref 150–440)
RBC: 3.62 MIL/uL — AB (ref 3.80–5.20)
RDW: 17 % — ABNORMAL HIGH (ref 11.5–14.5)
WBC: 9.7 10*3/uL (ref 3.6–11.0)

## 2016-11-04 LAB — BUN: BUN: 7 mg/dL (ref 6–20)

## 2016-11-04 LAB — CREATININE, SERUM
Creatinine, Ser: 0.72 mg/dL (ref 0.44–1.00)
GFR calc Af Amer: >60 mL/min (ref >60)
GFR calc non Af Amer: >60 mL/min (ref >60)

## 2016-11-04 LAB — CK: CK TOTAL: 456 U/L — AB (ref 38–234)

## 2016-11-04 LAB — SEDIMENTATION RATE: Sed Rate: 115 mm/hr — ABNORMAL HIGH (ref 0–30)

## 2016-11-05 LAB — C-REACTIVE PROTEIN: CRP: 1.1 mg/dL — ABNORMAL HIGH (ref <1.0)

## 2016-11-11 ENCOUNTER — Other Ambulatory Visit
Admission: RE | Admit: 2016-11-11 | Discharge: 2016-11-11 | Disposition: A | Discharge status: Home / Self Care | Payer: Medicaid Other | Source: Other Acute Inpatient Hospital | Attending: Specialist | Admitting: Specialist

## 2016-11-11 DIAGNOSIS — E1169 Type 2 diabetes mellitus with other specified complication: Secondary | ICD-10-CM | POA: Diagnosis present

## 2016-11-11 DIAGNOSIS — M868X8 Other osteomyelitis, other site: Secondary | ICD-10-CM | POA: Diagnosis present

## 2016-11-11 DIAGNOSIS — L03115 Cellulitis of right lower limb: Secondary | ICD-10-CM | POA: Insufficient documentation

## 2016-11-11 LAB — SEDIMENTATION RATE: SED RATE: 79 mm/h — AB (ref 0–30)

## 2016-11-11 LAB — CREATININE, SERUM: CREATININE: 0.72 mg/dL (ref 0.44–1.00)

## 2016-11-11 LAB — CBC WITH DIFFERENTIAL/PLATELET
BASOS ABS: 0.1 10*3/uL (ref 0–0.1)
Basophils Relative: 1 %
EOS PCT: 4 %
Eosinophils Absolute: 0.4 10*3/uL (ref 0–0.7)
HEMATOCRIT: 32.6 % — AB (ref 35.0–47.0)
Hemoglobin: 10.5 g/dL — ABNORMAL LOW (ref 12.0–16.0)
LYMPHS ABS: 2.7 10*3/uL (ref 1.0–3.6)
LYMPHS PCT: 30 %
MCH: 24.7 pg — ABNORMAL LOW (ref 26.0–34.0)
MCHC: 32.3 g/dL (ref 32.0–36.0)
MCV: 76.3 fL — AB (ref 80.0–100.0)
Monocytes Absolute: 0.8 10*3/uL (ref 0.2–0.9)
Monocytes Relative: 8 %
NEUTROS ABS: 5.1 10*3/uL (ref 1.4–6.5)
Neutrophils Relative %: 57 %
PLATELETS: 338 10*3/uL (ref 150–440)
RBC: 4.28 MIL/uL (ref 3.80–5.20)
RDW: 17.9 % — ABNORMAL HIGH (ref 11.5–14.5)
WBC: 9 10*3/uL (ref 3.6–11.0)

## 2016-11-11 LAB — CK: CK TOTAL: 137 U/L (ref 38–234)

## 2016-11-11 LAB — C-REACTIVE PROTEIN: CRP: <0.8 mg/dL (ref <1.0)

## 2016-11-11 LAB — BUN: BUN: 8 mg/dL (ref 6–20)

## 2016-11-21 ENCOUNTER — Other Ambulatory Visit
Admission: RE | Admit: 2016-11-21 | Discharge: 2016-11-21 | Disposition: A | Discharge status: Home / Self Care | Payer: Medicaid Other | Source: Ambulatory Visit | Attending: Specialist | Admitting: Specialist

## 2016-11-21 DIAGNOSIS — L03115 Cellulitis of right lower limb: Secondary | ICD-10-CM | POA: Insufficient documentation

## 2016-11-21 DIAGNOSIS — E1169 Type 2 diabetes mellitus with other specified complication: Secondary | ICD-10-CM | POA: Insufficient documentation

## 2016-11-21 LAB — CBC WITH DIFFERENTIAL/PLATELET
BASOS PCT: 1 %
Basophils Absolute: 0.1 10*3/uL (ref 0–0.1)
EOS ABS: 0.3 10*3/uL (ref 0–0.7)
Eosinophils Relative: 3 %
HCT: 31.7 % — ABNORMAL LOW (ref 35.0–47.0)
HEMOGLOBIN: 10.6 g/dL — AB (ref 12.0–16.0)
Lymphocytes Relative: 27 %
Lymphs Abs: 2.5 10*3/uL (ref 1.0–3.6)
MCH: 25.7 pg — ABNORMAL LOW (ref 26.0–34.0)
MCHC: 33.5 g/dL (ref 32.0–36.0)
MCV: 76.9 fL — ABNORMAL LOW (ref 80.0–100.0)
Monocytes Absolute: 0.6 10*3/uL (ref 0.2–0.9)
Monocytes Relative: 6 %
NEUTROS PCT: 63 %
Neutro Abs: 5.8 10*3/uL (ref 1.4–6.5)
PLATELETS: 410 10*3/uL (ref 150–440)
RBC: 4.13 MIL/uL (ref 3.80–5.20)
RDW: 18.2 % — ABNORMAL HIGH (ref 11.5–14.5)
WBC: 9.2 10*3/uL (ref 3.6–11.0)

## 2016-11-21 LAB — BUN: BUN: 10 mg/dL (ref 6–20)

## 2016-11-21 LAB — CREATININE, SERUM
Creatinine, Ser: 0.71 mg/dL (ref 0.44–1.00)
GFR calc non Af Amer: >60 mL/min (ref >60)

## 2016-11-21 LAB — CK: CK TOTAL: 34 U/L — AB (ref 38–234)

## 2016-11-25 ENCOUNTER — Other Ambulatory Visit
Admission: RE | Admit: 2016-11-25 | Discharge: 2016-11-25 | Disposition: A | Discharge status: Home / Self Care | Payer: Medicaid Other | Source: Ambulatory Visit | Attending: Specialist | Admitting: Specialist

## 2016-11-25 DIAGNOSIS — L97513 Non-pressure chronic ulcer of other part of right foot with necrosis of muscle: Secondary | ICD-10-CM | POA: Diagnosis not present

## 2016-11-25 DIAGNOSIS — M868X8 Other osteomyelitis, other site: Secondary | ICD-10-CM | POA: Insufficient documentation

## 2016-11-25 DIAGNOSIS — Z888 Allergy status to other drugs, medicaments and biological substances status: Secondary | ICD-10-CM | POA: Diagnosis not present

## 2016-11-25 DIAGNOSIS — E1169 Type 2 diabetes mellitus with other specified complication: Secondary | ICD-10-CM | POA: Insufficient documentation

## 2016-11-25 DIAGNOSIS — L03115 Cellulitis of right lower limb: Secondary | ICD-10-CM | POA: Insufficient documentation

## 2016-11-25 LAB — CREATININE, SERUM: CREATININE: 0.85 mg/dL (ref 0.44–1.00)

## 2016-11-25 LAB — CBC WITH DIFFERENTIAL/PLATELET
BASOS ABS: 0.1 10*3/uL (ref 0–0.1)
BASOS PCT: 1 %
Eosinophils Absolute: 0.3 10*3/uL (ref 0–0.7)
Eosinophils Relative: 3 %
HEMATOCRIT: 30.3 % — AB (ref 35.0–47.0)
Hemoglobin: 10.2 g/dL — ABNORMAL LOW (ref 12.0–16.0)
Lymphocytes Relative: 21 %
Lymphs Abs: 2.5 10*3/uL (ref 1.0–3.6)
MCH: 26.1 pg (ref 26.0–34.0)
MCHC: 33.8 g/dL (ref 32.0–36.0)
MCV: 77.1 fL — AB (ref 80.0–100.0)
MONO ABS: 0.9 10*3/uL (ref 0.2–0.9)
Monocytes Relative: 7 %
NEUTROS ABS: 7.9 10*3/uL — AB (ref 1.4–6.5)
Neutrophils Relative %: 68 %
PLATELETS: 352 10*3/uL (ref 150–440)
RBC: 3.93 MIL/uL (ref 3.80–5.20)
RDW: 18.3 % — AB (ref 11.5–14.5)
WBC: 11.7 10*3/uL — AB (ref 3.6–11.0)

## 2016-11-25 LAB — BUN: BUN: 10 mg/dL (ref 6–20)

## 2016-11-25 LAB — CK: CK TOTAL: 39 U/L (ref 38–234)

## 2016-11-25 LAB — SEDIMENTATION RATE: Sed Rate: 53 mm/hr — ABNORMAL HIGH (ref 0–30)

## 2016-11-25 LAB — C-REACTIVE PROTEIN: CRP: <0.8 mg/dL (ref <1.0)

## 2017-02-13 ENCOUNTER — Emergency Department: Payer: Medicaid Other

## 2017-02-13 ENCOUNTER — Inpatient Hospital Stay
Admission: EM | Admit: 2017-02-13 | Discharge: 2017-02-15 | DRG: 872 | Disposition: A | Discharge status: Home Health | Payer: Medicaid Other | Attending: Internal Medicine | Admitting: Internal Medicine

## 2017-02-13 ENCOUNTER — Encounter: Payer: Self-pay | Admitting: Emergency Medicine

## 2017-02-13 DIAGNOSIS — L03115 Cellulitis of right lower limb: Secondary | ICD-10-CM

## 2017-02-13 DIAGNOSIS — I119 Hypertensive heart disease without heart failure: Secondary | ICD-10-CM | POA: Diagnosis present

## 2017-02-13 DIAGNOSIS — G8929 Other chronic pain: Secondary | ICD-10-CM | POA: Diagnosis present

## 2017-02-13 DIAGNOSIS — L02611 Cutaneous abscess of right foot: Secondary | ICD-10-CM | POA: Diagnosis present

## 2017-02-13 DIAGNOSIS — Z993 Dependence on wheelchair: Secondary | ICD-10-CM

## 2017-02-13 DIAGNOSIS — E1169 Type 2 diabetes mellitus with other specified complication: Secondary | ICD-10-CM | POA: Diagnosis present

## 2017-02-13 DIAGNOSIS — M869 Osteomyelitis, unspecified: Secondary | ICD-10-CM | POA: Diagnosis present

## 2017-02-13 DIAGNOSIS — Z7982 Long term (current) use of aspirin: Secondary | ICD-10-CM

## 2017-02-13 DIAGNOSIS — L8990 Pressure ulcer of unspecified site, unspecified stage: Secondary | ICD-10-CM | POA: Insufficient documentation

## 2017-02-13 DIAGNOSIS — R4182 Altered mental status, unspecified: Secondary | ICD-10-CM | POA: Diagnosis present

## 2017-02-13 DIAGNOSIS — A419 Sepsis, unspecified organism: Principal | ICD-10-CM | POA: Diagnosis present

## 2017-02-13 DIAGNOSIS — Z833 Family history of diabetes mellitus: Secondary | ICD-10-CM

## 2017-02-13 DIAGNOSIS — L97519 Non-pressure chronic ulcer of other part of right foot with unspecified severity: Secondary | ICD-10-CM | POA: Diagnosis present

## 2017-02-13 DIAGNOSIS — Z89429 Acquired absence of other toe(s), unspecified side: Secondary | ICD-10-CM

## 2017-02-13 DIAGNOSIS — Z6830 Body mass index (BMI) 30.0-30.9, adult: Secondary | ICD-10-CM

## 2017-02-13 DIAGNOSIS — M797 Fibromyalgia: Secondary | ICD-10-CM | POA: Diagnosis present

## 2017-02-13 DIAGNOSIS — Z7401 Bed confinement status: Secondary | ICD-10-CM

## 2017-02-13 DIAGNOSIS — E785 Hyperlipidemia, unspecified: Secondary | ICD-10-CM | POA: Diagnosis present

## 2017-02-13 DIAGNOSIS — Z794 Long term (current) use of insulin: Secondary | ICD-10-CM

## 2017-02-13 DIAGNOSIS — Z716 Tobacco abuse counseling: Secondary | ICD-10-CM

## 2017-02-13 DIAGNOSIS — E13621 Other specified diabetes mellitus with foot ulcer: Secondary | ICD-10-CM

## 2017-02-13 DIAGNOSIS — D638 Anemia in other chronic diseases classified elsewhere: Secondary | ICD-10-CM | POA: Diagnosis present

## 2017-02-13 DIAGNOSIS — E11621 Type 2 diabetes mellitus with foot ulcer: Secondary | ICD-10-CM

## 2017-02-13 DIAGNOSIS — E669 Obesity, unspecified: Secondary | ICD-10-CM | POA: Diagnosis present

## 2017-02-13 DIAGNOSIS — Z886 Allergy status to analgesic agent status: Secondary | ICD-10-CM

## 2017-02-13 DIAGNOSIS — E1140 Type 2 diabetes mellitus with diabetic neuropathy, unspecified: Secondary | ICD-10-CM | POA: Diagnosis present

## 2017-02-13 DIAGNOSIS — E11628 Type 2 diabetes mellitus with other skin complications: Secondary | ICD-10-CM | POA: Diagnosis present

## 2017-02-13 DIAGNOSIS — L97512 Non-pressure chronic ulcer of other part of right foot with fat layer exposed: Secondary | ICD-10-CM

## 2017-02-13 DIAGNOSIS — E1165 Type 2 diabetes mellitus with hyperglycemia: Secondary | ICD-10-CM | POA: Diagnosis present

## 2017-02-13 DIAGNOSIS — Z79899 Other long term (current) drug therapy: Secondary | ICD-10-CM

## 2017-02-13 DIAGNOSIS — E11610 Type 2 diabetes mellitus with diabetic neuropathic arthropathy: Secondary | ICD-10-CM | POA: Diagnosis present

## 2017-02-13 DIAGNOSIS — F17210 Nicotine dependence, cigarettes, uncomplicated: Secondary | ICD-10-CM | POA: Diagnosis present

## 2017-02-13 LAB — CBC WITH DIFFERENTIAL/PLATELET
Basophils Absolute: 0.1 10*3/uL (ref 0–0.1)
Basophils Relative: 1 %
EOS ABS: 0 10*3/uL (ref 0–0.7)
Eosinophils Relative: 0 %
HEMATOCRIT: 24.6 % — AB (ref 35.0–47.0)
HEMOGLOBIN: 8.1 g/dL — AB (ref 12.0–16.0)
LYMPHS ABS: 1.2 10*3/uL (ref 1.0–3.6)
Lymphocytes Relative: 9 %
MCH: 24.4 pg — AB (ref 26.0–34.0)
MCHC: 33.1 g/dL (ref 32.0–36.0)
MCV: 73.8 fL — ABNORMAL LOW (ref 80.0–100.0)
MONO ABS: 1.3 10*3/uL — AB (ref 0.2–0.9)
MONOS PCT: 10 %
NEUTROS PCT: 80 %
Neutro Abs: 10.1 10*3/uL — ABNORMAL HIGH (ref 1.4–6.5)
Platelets: 452 10*3/uL — ABNORMAL HIGH (ref 150–440)
RBC: 3.33 MIL/uL — ABNORMAL LOW (ref 3.80–5.20)
RDW: 15.2 % — AB (ref 11.5–14.5)
WBC: 12.7 10*3/uL — ABNORMAL HIGH (ref 3.6–11.0)

## 2017-02-13 LAB — PROTIME-INR
INR: 1.17
Prothrombin Time: 15 seconds (ref 11.4–15.2)

## 2017-02-13 MED ORDER — VANCOMYCIN HCL IN DEXTROSE 1-5 GM/200ML-% IV SOLN
1000.0000 mg | Freq: Once | INTRAVENOUS | Status: AC
  Administered 2017-02-14: 1000 mg via INTRAVENOUS
  Filled 2017-02-13: qty 200

## 2017-02-13 MED ORDER — ACETAMINOPHEN 500 MG PO TABS
ORAL_TABLET | ORAL | Status: AC
  Administered 2017-02-13: 1000 mg
  Filled 2017-02-13: qty 2

## 2017-02-13 MED ORDER — SODIUM CHLORIDE 0.9 % IV BOLUS (SEPSIS)
1000.0000 mL | Freq: Once | INTRAVENOUS | Status: AC
  Administered 2017-02-13: 1000 mL via INTRAVENOUS

## 2017-02-13 MED ORDER — ACETAMINOPHEN 500 MG PO TABS
ORAL_TABLET | ORAL | Status: AC
  Filled 2017-02-13: qty 1

## 2017-02-13 MED ORDER — PIPERACILLIN-TAZOBACTAM 3.375 G IVPB 30 MIN
3.3750 g | Freq: Once | INTRAVENOUS | Status: AC
  Administered 2017-02-13: 3.375 g via INTRAVENOUS
  Filled 2017-02-13: qty 50

## 2017-02-13 MED ORDER — SODIUM CHLORIDE 0.9 % IV BOLUS (SEPSIS): 1000.0000 mL | Freq: Once | INTRAVENOUS | Status: DC

## 2017-02-13 MED ORDER — SODIUM CHLORIDE 0.9 % IV BOLUS (SEPSIS)
1000.0000 mL | Freq: Once | INTRAVENOUS | Status: AC
  Administered 2017-02-14: 1000 mL via INTRAVENOUS

## 2017-02-13 NOTE — ED Provider Notes (Addendum)
[facility] Emergency Department Provider Note  ____________________________________________   First MD Initiated Contact with Patient 02/13/17 2335     (approximate)  I have reviewed the triage vital signs and the nursing notes.   HISTORY  Chief Complaint Fever  Level 5 caveat:  history/ROS limited by acute/critical illness  HPI Jackie Miles is a 51 y.o. 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  Her family called EMS when they initially could not make contact with her and then found her confused and obviously ill.  She is not able to provide any additional history although she denies any pain at this time.  She has what appears to be a central line in place in her right chest and she states it has been there for 2 months and was placed by [facility] but does not explain why she never had it removed.  Apparently it was originally placed for her to get outpatient IV antibiotics but it is unclear if she is still being treated, although it is thought she is not currently undergoing treatment.She also has an open weeping wound on her right foot that has apparently gotten worse recently although the timeframe is unclear.  No additional history is available.    Past Medical History:  Diagnosis Date  . Asthma   . Diabetes mellitus without complication (HCC)   . Fibromyalgia   . H/O percutaneous left heart catheterization   . Hypertension     Patient Active Problem List   Diagnosis Date Noted  . Osteomyelitis (HCC) 02/21/2016  . Foot infection 01/22/2016  . Sepsis (HCC) 01/12/2016    History reviewed. No pertinent surgical history.  Prior to Admission medications   Medication Sig Start Date End Date Taking? Authorizing Provider  albuterol (PROVENTIL HFA;VENTOLIN HFA) 108 (90 Base) MCG/ACT inhaler Inhale 2 puffs into the lungs every 6 (six) hours as  needed for wheezing or shortness of breath.   Yes [provider]  amitriptyline (ELAVIL) 100 MG tablet Take 100 mg by mouth at bedtime.   Yes [provider]  aspirin EC 81 MG tablet Take 81 mg by mouth daily.   Yes [provider]  atorvastatin (LIPITOR) 40 MG tablet Take 40 mg by mouth at bedtime. 01/28/17 01/28/18 Yes [provider]  cetirizine (ZYRTEC) 10 MG tablet Take 10 mg by mouth at bedtime.    Yes [provider]  fluticasone (FLONASE) 50 MCG/ACT nasal spray Place 2 sprays into both nostrils daily as needed for rhinitis.    Yes [provider]  gabapentin (NEURONTIN) 800 MG tablet Take 800-2,400 mg by mouth 2 (two) times daily. Pt takes one tablet in the morning and three tablets at bedtime.   Yes [provider]  insulin aspart (NOVOLOG FLEXPEN) 100 UNIT/ML FlexPen Inject 7 Units into the skin 3 (three) times daily with meals.   Yes [provider]  insulin glargine (LANTUS) 100 unit/mL SOPN Inject 21 Units into the skin at bedtime. 01/28/17 04/10/17 Yes [provider]  metFORMIN (GLUCOPHAGE) 1000 MG tablet Take 1,000 mg by mouth 2 (two) times daily with a meal.   Yes [provider]  vitamin B-12 (CYANOCOBALAMIN) 1000 MCG tablet Take 1,000 mcg by mouth daily.   Yes [provider]    Allergies Ibuprofen  Family History  Problem Relation Age of Onset  . Diabetes Mother   . Diabetes Sister  Social History Social History  Substance Use Topics  . Smoking status: Current Every Day Smoker    Packs/day: 1.00  . Smokeless tobacco: Never Used  . Alcohol use No    Review of Systems Level 5 caveat:  history/ROS limited by acute/critical illness ____________________________________________   PHYSICAL EXAM:  VITAL SIGNS: ED Triage Vitals  Enc Vitals Group     BP 02/13/17 2308 (!) 149/71     Pulse Rate 02/13/17 2308 (!) 121     Resp 02/13/17 2308 (!) 24     Temp 02/13/17 2308  (!) 103.2 F (39.6 C)     Temp Source 02/13/17 2308 Oral     SpO2 02/13/17 2308 100 %     Weight 02/13/17 2309 202 lb (91.6 kg)     Height 02/13/17 2309 5\' 8"  (1.727 m)     Head Circumference --      Peak Flow --      Pain Score --      Pain Loc --      Pain Edu? --      Excl. in GC? --     Constitutional: Chills/rigors, ill-appearing, altered and speaking but not making sense Eyes: Conjunctivae are normal.  Head: Atraumatic. Nose: No congestion/rhinnorhea. Mouth/Throat: Mucous membranes are dry Neck: No stridor.  No meningeal signs.   Cardiovascular: Sinus tachycardia, regular rhythm. Good peripheral circulation. Grossly normal heart sounds. Respiratory: Normal respiratory effort.  No retractions. Lungs CTAB. Gastrointestinal: Obese, Soft and nontender. No distention.  Musculoskeletal: The patient has edema of the right lower leg extending down and including significant swelling of the right foot.  As documented below in the skin exam she has a large open wound on the bottom of the right foot. Neurologic:  Patient is moving all 4 extremities and has no obvious gross neurological deficits but she is too altered to participate and a comprehensive neurological exam Skin:  Skin is warm and dry.  She has a several centimeter open and deep weeping wound on the bottom of her right foot with significant surrounding cellulitis and edema including the entire right foot and extending up the right lower leg.  The foot and leg are warm and warmer than the left lower leg.   ____________________________________________   LABS (all labs ordered are listed, but only abnormal results are displayed)  Labs Reviewed  COMPREHENSIVE METABOLIC PANEL - Abnormal; Notable for the following:       Result Value   Sodium 129 (*)    Chloride 96 (*)    Glucose, Bld 289 (*)    Creatinine, Ser 1.23 (*)    Calcium 8.4 (*)    Albumin 2.6 (*)    ALT 11 (*)    Alkaline Phosphatase 171 (*)    GFR calc non Af  Amer 50 (*)    GFR calc Af Amer 57 (*)    All other components within normal limits  CBC WITH DIFFERENTIAL/PLATELET - Abnormal; Notable for the following:    WBC 12.7 (*)    RBC 3.33 (*)    Hemoglobin 8.1 (*)    HCT 24.6 (*)    MCV 73.8 (*)    MCH 24.4 (*)    RDW 15.2 (*)    Platelets 452 (*)    Neutro Abs 10.1 (*)    Monocytes Absolute 1.3 (*)    All other components within normal limits  URINALYSIS, COMPLETE (UACMP) WITH MICROSCOPIC - Abnormal; Notable for the following:    Color, Urine AMBER (*)  APPearance CLOUDY (*)    Glucose, UA 50 (*)    Hgb urine dipstick SMALL (*)    Protein, ur 100 (*)    Bacteria, UA RARE (*)    Squamous Epithelial / LPF 0-5 (*)    All other components within normal limits  BRAIN NATRIURETIC PEPTIDE - Abnormal; Notable for the following:    B Natriuretic Peptide 108.0 (*)    All other components within normal limits  CULTURE, BLOOD (ROUTINE X 2)  CULTURE, BLOOD (ROUTINE X 2)  URINE CULTURE  AEROBIC CULTURE (SUPERFICIAL SPECIMEN)  LACTIC ACID, PLASMA  PROTIME-INR  LIPASE, BLOOD  TROPONIN I  PROCALCITONIN  TSH  HEMOGLOBIN A1C   ____________________________________________  EKG  ED ECG REPORT I, Mikka Kissner, the attending physician, personally viewed and interpreted this ECG.  Date: 02/13/2017 EKG Time: 23:21 Rate: 120 Rhythm: Sinus tachycardia QRS Axis: normal Intervals: normal ST/T Wave abnormalities: Non-specific ST segment / T-wave changes, but no evidence of acute ischemia. Conduction Disturbances: none Narrative Interpretation: no evidence of acute ischemia  ____________________________________________  RADIOLOGY   Dg Chest Port 1 View  Result Date: 02/14/2017 CLINICAL DATA:  Sepsis.  Fever and altered mental status. EXAM: PORTABLE CHEST 1 VIEW COMPARISON:  01/25/2016 FINDINGS: Right central line in place, tip in the mid SVC. Prominence of the cardiac size may be accentuated by portable AP technique, there is mild  cardiomegaly. No pulmonary edema, consolidation, pleural fluid or pneumothorax. No acute osseous abnormality is seen. IMPRESSION: Mild cardiomegaly, may be accentuated by technique. No acute abnormality. Right central line in place, tip in the mid SVC. Electronically Signed   By: Rubye Oaks M.D.   On: 02/14/2017 00:09   Dg Foot 2 Views Right  Result Date: 02/14/2017 CLINICAL DATA:  Open wound and concern for osteomyelitis EXAM: RIGHT FOOT - 2 VIEW COMPARISON:  Right foot radiograph 02/21/2016 FINDINGS: There has been amputation of the right fourth and fifth rays at the mid metatarsal. There is extensive posttraumatic deformity at the Lisfranc joint. Multiple tarsal bones of also been resected. There is a large soft tissue ulceration at the plantar aspect of the foot. No definite osteolysis is seen, but irregular hypertrophy of the bone superior to the ulcer could obscure small areas of erosion. There is complete loss of the plantar arch. IMPRESSION: 1. Large plantar soft tissue ulceration. No definite osteolysis of the underlying bone, though the degree of chronic hypertrophy in this area could obscure small areas of erosion. 2. Status post resection of the right fourth and fifth rays at the mid metatarsal shaft. Irregular appearance of the midfoot may also indicate partial resection of the tarsal bones. 3. 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 Electronically Signed   By: Deatra Robinson M.D.   On: 02/14/2017 00:05    ____________________________________________   PROCEDURES  Critical Care performed: Yes, see critical care procedure note(s)   Procedure(s) performed:   .Critical Care Performed by: Loleta Rose Authorized by: Loleta Rose   Critical care provider statement:    Critical care time (minutes):  45   Critical care time was exclusive of:  Separately billable procedures and treating other patients   Critical care was necessary to treat or prevent imminent or  life-threatening deterioration of the following conditions:  Sepsis   Critical care was time spent personally by me on the following activities:  Development of treatment plan with patient or surrogate, discussions with consultants, evaluation of patient's response to treatment, examination of patient, obtaining history from patient  or surrogate, ordering and performing treatments and interventions, ordering and review of laboratory studies, ordering and review of radiographic studies, pulse oximetry, re-evaluation of patient's condition and review of old charts       ____________________________________________   INITIAL IMPRESSION / ASSESSMENT AND PLAN / ED COURSE  Pertinent labs & imaging results that were available during my care of the patient were reviewed by me and considered in my medical decision making (see chart for details).     Clinical Course as of Feb 14 701  Thu Feb 13, 2017  2345 I was informed about the patient's arrival given that she qualifies as code sepsis.  She is altered and having chills and rigors.  She appears ill but nontoxic and likely sources include the open weeping wound and surrounding cellulitis on her right foot as well as what appears to be an old central line that was left in place possibly for as long as 2 months.  I am proceeding with a broad workup and empiric antibiotics and 30 mL/kg of normal saline as per protocol.  We are sending a wound culture of the right foot and checking for osteomyelitis with radiographs.  She will require admission after workup is complete.  She is protecting her airway and having no breathing difficulties and has a normal blood pressure at this time.  [CF]  Fri Feb 14, 2017  0008 No evidence of acute osteomyelitis DG Foot 2 Views Right [CF]  0038 No evidence of severe sepsis.  I will stop with 2 L of IV fluid at this time and reassess.  Hemoglobin is low but without any evidence of active bleeding.  I will discuss with the  hospitalist for admission.  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, with fat layer exposed, unspecified part of foot (HCC)     MEDICATIONS GIVEN DURING THIS VISIT:  Medications  acetaminophen (TYLENOL) 500 MG tablet (not administered)  piperacillin-tazobactam (ZOSYN) IVPB 3.375 g (3.375 g Intravenous New Bag/Given 02/14/17 0553)  vancomycin (VANCOCIN) IVPB 1000 mg/200 mL premix (1,000 mg Intravenous Restarted 02/14/17 0609)  loratadine (CLARITIN) tablet 10 mg (not administered)  fluticasone (FLONASE) 50 MCG/ACT nasal spray 2 spray (not administered)  amitriptyline (ELAVIL) tablet 100 mg (not administered)  vitamin B-12 (CYANOCOBALAMIN) tablet 1,000 mcg (not administered)  albuterol (PROVENTIL) (2.5 MG/3ML) 0.083% nebulizer solution 3 mL (not administered)  atorvastatin (LIPITOR) tablet 40 mg (not administered)  aspirin EC tablet 81 mg (not administered)  heparin injection 5,000 Units (5,000 Units Subcutaneous Given 02/14/17 0552)  0.9 %  sodium chloride infusion ( Intravenous New Bag/Given 02/14/17 0422)  ondansetron (ZOFRAN) tablet 4 mg (not administered)    Or  ondansetron (ZOFRAN) injection 4 mg (not administered)  acetaminophen (TYLENOL) tablet 650 mg ( Oral See Alternative 02/14/17 0500)    Or  acetaminophen (TYLENOL) suppository 650 mg (650 mg Rectal Given 02/14/17 0500)  docusate sodium (COLACE) capsule 100 mg (not administered)  insulin aspart (novoLOG) injection 0-15 Units (not administered)  insulin glargine (LANTUS) injection 16 Units (not administered)  gabapentin (NEURONTIN) capsule 800 mg (not administered)  gabapentin (NEURONTIN) capsule 2,400 mg (not  administered)  acetaminophen (TYLENOL) 500 MG tablet (1,000 mg  Given 02/13/17 2340)  sodium  chloride 0.9 % bolus 1,000 mL (0 mLs Intravenous Stopped 02/14/17 0010)    And  sodium chloride 0.9 % bolus 1,000 mL (0 mLs Intravenous Stopped 02/14/17 0115)  piperacillin-tazobactam (ZOSYN) IVPB 3.375 g (0 g Intravenous Stopped 02/14/17 0010)  vancomycin (VANCOCIN) IVPB 1000 mg/200 mL premix (0 mg Intravenous Stopped 02/14/17 0124)  0.9 %  sodium chloride infusion ( Intravenous Stopped 02/14/17 0300)  sodium chloride 0.9 % bolus 1,000 mL (1,000 mLs Intravenous Transfusing/Transfer 02/14/17 0358)     NEW OUTPATIENT MEDICATIONS STARTED DURING THIS VISIT:  Current Discharge Medication List      Current Discharge Medication List      Current Discharge Medication List       Note:  This document was prepared using Dragon voice recognition software and may include unintentional dictation errors.    Loleta Rose, MD 02/14/17 1610    Loleta Rose, MD 02/14/17 [phone]

## 2017-02-13 NOTE — ED Triage Notes (Signed)
Pt to ED via EMS from home c/o chills.  Per EMS family concerned about altered mental status and fevers.  Patient diabetic with weeping wound to right foot.  Also has right port-a-cath in place that was supposed to be removed 2 months ago after last round of antibiotics but hasnt been.  Patient alert to self, disoriented x3.  EMS vitals 140/84 BP, 366 CBG, 37 CO2, given normal saline.  Patient with chills and shivering, denies pain.

## 2017-02-14 ENCOUNTER — Encounter: Payer: Self-pay | Admitting: Internal Medicine

## 2017-02-14 DIAGNOSIS — G8929 Other chronic pain: Secondary | ICD-10-CM | POA: Diagnosis present

## 2017-02-14 DIAGNOSIS — M869 Osteomyelitis, unspecified: Secondary | ICD-10-CM | POA: Diagnosis present

## 2017-02-14 DIAGNOSIS — D638 Anemia in other chronic diseases classified elsewhere: Secondary | ICD-10-CM | POA: Diagnosis present

## 2017-02-14 DIAGNOSIS — E11628 Type 2 diabetes mellitus with other skin complications: Secondary | ICD-10-CM | POA: Diagnosis present

## 2017-02-14 DIAGNOSIS — R4182 Altered mental status, unspecified: Secondary | ICD-10-CM | POA: Diagnosis present

## 2017-02-14 DIAGNOSIS — M797 Fibromyalgia: Secondary | ICD-10-CM | POA: Diagnosis present

## 2017-02-14 DIAGNOSIS — Z886 Allergy status to analgesic agent status: Secondary | ICD-10-CM | POA: Diagnosis not present

## 2017-02-14 DIAGNOSIS — F17210 Nicotine dependence, cigarettes, uncomplicated: Secondary | ICD-10-CM | POA: Diagnosis present

## 2017-02-14 DIAGNOSIS — I119 Hypertensive heart disease without heart failure: Secondary | ICD-10-CM | POA: Diagnosis present

## 2017-02-14 DIAGNOSIS — E785 Hyperlipidemia, unspecified: Secondary | ICD-10-CM | POA: Diagnosis present

## 2017-02-14 DIAGNOSIS — E11621 Type 2 diabetes mellitus with foot ulcer: Secondary | ICD-10-CM | POA: Diagnosis present

## 2017-02-14 DIAGNOSIS — L8990 Pressure ulcer of unspecified site, unspecified stage: Secondary | ICD-10-CM | POA: Insufficient documentation

## 2017-02-14 DIAGNOSIS — E13621 Other specified diabetes mellitus with foot ulcer: Secondary | ICD-10-CM | POA: Diagnosis not present

## 2017-02-14 DIAGNOSIS — Z794 Long term (current) use of insulin: Secondary | ICD-10-CM | POA: Diagnosis not present

## 2017-02-14 DIAGNOSIS — E1169 Type 2 diabetes mellitus with other specified complication: Secondary | ICD-10-CM | POA: Diagnosis present

## 2017-02-14 DIAGNOSIS — E11610 Type 2 diabetes mellitus with diabetic neuropathic arthropathy: Secondary | ICD-10-CM | POA: Diagnosis present

## 2017-02-14 DIAGNOSIS — Z7982 Long term (current) use of aspirin: Secondary | ICD-10-CM | POA: Diagnosis not present

## 2017-02-14 DIAGNOSIS — E1140 Type 2 diabetes mellitus with diabetic neuropathy, unspecified: Secondary | ICD-10-CM | POA: Diagnosis present

## 2017-02-14 DIAGNOSIS — L97512 Non-pressure chronic ulcer of other part of right foot with fat layer exposed: Secondary | ICD-10-CM | POA: Diagnosis not present

## 2017-02-14 DIAGNOSIS — E669 Obesity, unspecified: Secondary | ICD-10-CM | POA: Diagnosis present

## 2017-02-14 DIAGNOSIS — E1165 Type 2 diabetes mellitus with hyperglycemia: Secondary | ICD-10-CM | POA: Diagnosis present

## 2017-02-14 DIAGNOSIS — L97519 Non-pressure chronic ulcer of other part of right foot with unspecified severity: Secondary | ICD-10-CM | POA: Diagnosis present

## 2017-02-14 DIAGNOSIS — Z89429 Acquired absence of other toe(s), unspecified side: Secondary | ICD-10-CM | POA: Diagnosis not present

## 2017-02-14 DIAGNOSIS — Z79899 Other long term (current) drug therapy: Secondary | ICD-10-CM | POA: Diagnosis not present

## 2017-02-14 DIAGNOSIS — L03115 Cellulitis of right lower limb: Secondary | ICD-10-CM | POA: Diagnosis present

## 2017-02-14 DIAGNOSIS — L02611 Cutaneous abscess of right foot: Secondary | ICD-10-CM | POA: Diagnosis present

## 2017-02-14 DIAGNOSIS — A419 Sepsis, unspecified organism: Secondary | ICD-10-CM | POA: Diagnosis present

## 2017-02-14 HISTORY — DX: Pressure ulcer of unspecified site, unspecified stage: L89.90

## 2017-02-14 LAB — URINALYSIS, COMPLETE (UACMP) WITH MICROSCOPIC
BILIRUBIN URINE: NEGATIVE
Glucose, UA: 50 mg/dL — AB
KETONES UR: NEGATIVE mg/dL
Leukocytes, UA: NEGATIVE
Nitrite: NEGATIVE
Protein, ur: 100 mg/dL — AB
SPECIFIC GRAVITY, URINE: 1.019 (ref 1.005–1.030)
pH: 5 (ref 5.0–8.0)

## 2017-02-14 LAB — GLUCOSE, CAPILLARY
Glucose-Capillary: 249 mg/dL — ABNORMAL HIGH (ref 65–99)
Glucose-Capillary: 198 mg/dL — ABNORMAL HIGH (ref 65–99)
Glucose-Capillary: 269 mg/dL — ABNORMAL HIGH (ref 65–99)
Glucose-Capillary: 173 mg/dL — ABNORMAL HIGH (ref 65–99)

## 2017-02-14 LAB — COMPREHENSIVE METABOLIC PANEL
ALBUMIN: 2.6 g/dL — AB (ref 3.5–5.0)
ALT: 11 U/L — ABNORMAL LOW (ref 14–54)
ANION GAP: 9 (ref 5–15)
AST: 26 U/L (ref 15–41)
Alkaline Phosphatase: 171 U/L — ABNORMAL HIGH (ref 38–126)
BUN: 16 mg/dL (ref 6–20)
CALCIUM: 8.4 mg/dL — AB (ref 8.9–10.3)
CHLORIDE: 96 mmol/L — AB (ref 101–111)
CO2: 24 mmol/L (ref 22–32)
Creatinine, Ser: 1.23 mg/dL — ABNORMAL HIGH (ref 0.44–1.00)
GFR calc Af Amer: 57 mL/min — ABNORMAL LOW (ref >60)
GFR, EST NON AFRICAN AMERICAN: 50 mL/min — AB (ref >60)
Glucose, Bld: 289 mg/dL — ABNORMAL HIGH (ref 65–99)
POTASSIUM: 4 mmol/L (ref 3.5–5.1)
Sodium: 129 mmol/L — ABNORMAL LOW (ref 135–145)
TOTAL PROTEIN: 8 g/dL (ref 6.5–8.1)
Total Bilirubin: 0.7 mg/dL (ref 0.3–1.2)

## 2017-02-14 LAB — BRAIN NATRIURETIC PEPTIDE: B NATRIURETIC PEPTIDE 5: 108 pg/mL — AB (ref 0.0–100.0)

## 2017-02-14 LAB — PROCALCITONIN: Procalcitonin: 1.95 ng/mL

## 2017-02-14 LAB — LACTIC ACID, PLASMA: Lactic Acid, Venous: 1.2 mmol/L (ref 0.5–1.9)

## 2017-02-14 LAB — AMMONIA: AMMONIA: 33 umol/L (ref 9–35)

## 2017-02-14 LAB — LIPASE, BLOOD: LIPASE: 38 U/L (ref 11–51)

## 2017-02-14 LAB — TROPONIN I

## 2017-02-14 LAB — TSH: TSH: 1.205 u[IU]/mL (ref 0.350–4.500)

## 2017-02-14 MED ORDER — GABAPENTIN 300 MG PO CAPS
2400.0000 mg | ORAL_CAPSULE | Freq: Every day | ORAL | Status: DC
  Administered 2017-02-14: 2400 mg via ORAL
  Filled 2017-02-14: qty 8

## 2017-02-14 MED ORDER — FLUTICASONE PROPIONATE 50 MCG/ACT NA SUSP
2.0000 | Freq: Every day | NASAL | Status: DC | PRN
  Filled 2017-02-14: qty 16

## 2017-02-14 MED ORDER — HEPARIN SODIUM (PORCINE) 5000 UNIT/ML IJ SOLN
5000.0000 [IU] | Freq: Three times a day (TID) | INTRAMUSCULAR | Status: DC
  Administered 2017-02-14 – 2017-02-15 (×4): 5000 [IU] via SUBCUTANEOUS
  Filled 2017-02-14 (×4): qty 1

## 2017-02-14 MED ORDER — ALBUTEROL SULFATE (2.5 MG/3ML) 0.083% IN NEBU: 3.0000 mL | INHALATION_SOLUTION | Freq: Four times a day (QID) | RESPIRATORY_TRACT | Status: DC | PRN

## 2017-02-14 MED ORDER — GABAPENTIN 300 MG PO CAPS
800.0000 mg | ORAL_CAPSULE | Freq: Every morning | ORAL | Status: DC
  Administered 2017-02-14 – 2017-02-15 (×2): 800 mg via ORAL
  Filled 2017-02-14 (×3): qty 2

## 2017-02-14 MED ORDER — AMITRIPTYLINE HCL 50 MG PO TABS
100.0000 mg | ORAL_TABLET | Freq: Every day | ORAL | Status: DC
  Administered 2017-02-14: 100 mg via ORAL
  Filled 2017-02-14 (×2): qty 1

## 2017-02-14 MED ORDER — GABAPENTIN 800 MG PO TABS: 800.0000 mg | ORAL_TABLET | Freq: Two times a day (BID) | ORAL | Status: DC

## 2017-02-14 MED ORDER — INSULIN ASPART 100 UNIT/ML ~~LOC~~ SOLN
0.0000 [IU] | Freq: Three times a day (TID) | SUBCUTANEOUS | Status: DC
  Administered 2017-02-14: 3 [IU] via SUBCUTANEOUS
  Administered 2017-02-14: 8 [IU] via SUBCUTANEOUS
  Administered 2017-02-14: 5 [IU] via SUBCUTANEOUS
  Administered 2017-02-15: 3 [IU] via SUBCUTANEOUS
  Filled 2017-02-14: qty 3
  Filled 2017-02-14: qty 5
  Filled 2017-02-14: qty 3
  Filled 2017-02-14: qty 8

## 2017-02-14 MED ORDER — VITAMIN B-12 1000 MCG PO TABS
1000.0000 ug | ORAL_TABLET | Freq: Every day | ORAL | Status: DC
  Administered 2017-02-14 – 2017-02-15 (×2): 1000 ug via ORAL
  Filled 2017-02-14 (×2): qty 1

## 2017-02-14 MED ORDER — ONDANSETRON HCL 4 MG/2ML IJ SOLN: 4.0000 mg | Freq: Four times a day (QID) | INTRAMUSCULAR | Status: DC | PRN

## 2017-02-14 MED ORDER — ONDANSETRON HCL 4 MG PO TABS: 4.0000 mg | ORAL_TABLET | Freq: Four times a day (QID) | ORAL | Status: DC | PRN

## 2017-02-14 MED ORDER — ATORVASTATIN CALCIUM 20 MG PO TABS
40.0000 mg | ORAL_TABLET | Freq: Every day | ORAL | Status: DC
  Administered 2017-02-14: 40 mg via ORAL
  Filled 2017-02-14: qty 2

## 2017-02-14 MED ORDER — METRONIDAZOLE IN NACL 5-0.79 MG/ML-% IV SOLN
500.0000 mg | Freq: Three times a day (TID) | INTRAVENOUS | Status: DC
  Administered 2017-02-14 – 2017-02-15 (×4): 500 mg via INTRAVENOUS
  Filled 2017-02-14 (×6): qty 100

## 2017-02-14 MED ORDER — DOCUSATE SODIUM 100 MG PO CAPS
100.0000 mg | ORAL_CAPSULE | Freq: Two times a day (BID) | ORAL | Status: DC
  Administered 2017-02-14 – 2017-02-15 (×3): 100 mg via ORAL
  Filled 2017-02-14 (×3): qty 1

## 2017-02-14 MED ORDER — ACETAMINOPHEN 325 MG PO TABS
650.0000 mg | ORAL_TABLET | Freq: Four times a day (QID) | ORAL | Status: DC | PRN
  Administered 2017-02-14: 650 mg via ORAL
  Filled 2017-02-14: qty 2

## 2017-02-14 MED ORDER — INSULIN GLARGINE 100 UNIT/ML ~~LOC~~ SOLN
16.0000 [IU] | Freq: Every day | SUBCUTANEOUS | Status: DC
  Administered 2017-02-14: 16 [IU] via SUBCUTANEOUS
  Filled 2017-02-14 (×2): qty 0.16

## 2017-02-14 MED ORDER — SODIUM CHLORIDE 0.9 % IV SOLN
INTRAVENOUS | Status: DC
  Administered 2017-02-14 (×2): via INTRAVENOUS

## 2017-02-14 MED ORDER — PIPERACILLIN-TAZOBACTAM 3.375 G IVPB
3.3750 g | Freq: Three times a day (TID) | INTRAVENOUS | Status: DC
  Administered 2017-02-14 (×2): 3.375 g via INTRAVENOUS
  Filled 2017-02-14 (×3): qty 50

## 2017-02-14 MED ORDER — LORATADINE 10 MG PO TABS
10.0000 mg | ORAL_TABLET | Freq: Every day | ORAL | Status: DC
  Administered 2017-02-14 – 2017-02-15 (×2): 10 mg via ORAL
  Filled 2017-02-14 (×2): qty 1

## 2017-02-14 MED ORDER — INSULIN ASPART 100 UNIT/ML ~~LOC~~ SOLN: 0.0000 [IU] | Freq: Every day | SUBCUTANEOUS | Status: DC

## 2017-02-14 MED ORDER — ACETAMINOPHEN 650 MG RE SUPP
650.0000 mg | Freq: Four times a day (QID) | RECTAL | Status: DC | PRN
  Administered 2017-02-14: 650 mg via RECTAL
  Filled 2017-02-14 (×2): qty 1

## 2017-02-14 MED ORDER — SODIUM CHLORIDE 0.9 % IV BOLUS (SEPSIS)
1000.0000 mL | Freq: Once | INTRAVENOUS | Status: AC
  Administered 2017-02-14: 1000 mL via INTRAVENOUS

## 2017-02-14 MED ORDER — SODIUM CHLORIDE 0.9 % IV SOLN
Freq: Once | INTRAVENOUS | Status: AC
  Administered 2017-02-14: 01:00:00 via INTRAVENOUS

## 2017-02-14 MED ORDER — VANCOMYCIN HCL IN DEXTROSE 1-5 GM/200ML-% IV SOLN
1000.0000 mg | Freq: Two times a day (BID) | INTRAVENOUS | Status: DC
Start: 2017-02-14 — End: 2017-02-15
  Administered 2017-02-14 – 2017-02-15 (×4): 1000 mg via INTRAVENOUS
  Filled 2017-02-14 (×5): qty 200

## 2017-02-14 MED ORDER — ASPIRIN EC 81 MG PO TBEC
81.0000 mg | DELAYED_RELEASE_TABLET | Freq: Every day | ORAL | Status: DC
  Administered 2017-02-14 – 2017-02-15 (×2): 81 mg via ORAL
  Filled 2017-02-14 (×2): qty 1

## 2017-02-14 MED ORDER — DEXTROSE 5 % IV SOLN
2.0000 g | Freq: Two times a day (BID) | INTRAVENOUS | Status: DC
  Administered 2017-02-14 – 2017-02-15 (×4): 2 g via INTRAVENOUS
  Filled 2017-02-14 (×5): qty 2

## 2017-02-14 MED ORDER — LIVING WELL WITH DIABETES BOOK
Freq: Once | Status: AC
  Administered 2017-02-14: 13:00:00
  Filled 2017-02-14: qty 1

## 2017-02-14 NOTE — H&P (Signed)
Jackie Miles is an 51 y.o. female.   Chief Complaint: Fever HPI: The patient with past medical history of uncontrolled diabetes and hypertension presents to the emergency department complaining of fever. She states that she has had sweats and chills intermittently for the last 2 days. She denies nausea, vomiting or diarrhea. She also denies shortness of breath or chest pain. In the emergency department she was found to be febrile and tachycardic with leukocytosis. The patient had been admitted to [facility] for an infection of her right foot. She states the foot has swollen and become more painful. During that hospitalization a central line was placed which is still in place. Once blood cultures were obtained in the emergency department broad-spectrum antibiotics were initiated. During the history and physical this examiner removed her subclavian central line before admitting the patient to the hospitalist service for further management.  Past Medical History:  Diagnosis Date  . Asthma   . Diabetes mellitus without complication ([place])   . Fibromyalgia   . H/O percutaneous left heart catheterization   . Hypertension     Past Surgical History:  Procedure Laterality Date  . toe amputation      Family History  Problem Relation Age of Onset  . Diabetes Mother   . Diabetes Sister    Social History:  reports that she has been smoking.  She has been smoking about 1.00 pack per day. She has never used smokeless tobacco. She reports that she does not drink alcohol or use drugs.  Allergies:  Allergies  Allergen Reactions  . Ibuprofen Other (See Comments)    Reaction:  Acid reflux     Medications Prior to Admission  Medication Sig Dispense Refill  . albuterol (PROVENTIL HFA;VENTOLIN HFA) 108 (90 Base) MCG/ACT inhaler Inhale 2 puffs into the lungs every 6 (six) hours as needed for wheezing or shortness of breath.    Marland Kitchen amitriptyline (ELAVIL) 100 MG tablet Take 100 mg by mouth at  bedtime.  0  . aspirin EC 81 MG tablet Take 81 mg by mouth daily.    Marland Kitchen atorvastatin (LIPITOR) 40 MG tablet Take 40 mg by mouth at bedtime.    . cetirizine (ZYRTEC) 10 MG tablet Take 10 mg by mouth at bedtime.     . fluticasone (FLONASE) 50 MCG/ACT nasal spray Place 2 sprays into both nostrils daily as needed for rhinitis.     Marland Kitchen gabapentin (NEURONTIN) 800 MG tablet Take 800-2,400 mg by mouth 2 (two) times daily. Pt takes one tablet in the morning and three tablets at bedtime.    . insulin aspart (NOVOLOG FLEXPEN) 100 UNIT/ML FlexPen Inject 7 Units into the skin 3 (three) times daily with meals.    . insulin glargine (LANTUS) 100 unit/mL SOPN Inject 21 Units into the skin at bedtime.    . metFORMIN (GLUCOPHAGE) 1000 MG tablet Take 1,000 mg by mouth 2 (two) times daily with a meal.    . vitamin B-12 (CYANOCOBALAMIN) 1000 MCG tablet Take 1,000 mcg by mouth daily.      Results for orders placed or performed during the hospital encounter of 02/13/17 (from the past 48 hour(s))  Comprehensive metabolic panel     Status: Abnormal   Collection Time: 02/13/17 11:25 PM  Result Value Ref Range   Sodium 129 (L) 135 - 145 mmol/L   Potassium 4.0 3.5 - 5.1 mmol/L   Chloride 96 (L) 101 - 111 mmol/L   CO2 24 22 - 32 mmol/L  Glucose, Bld 289 (H) 65 - 99 mg/dL   BUN 16 6 - 20 mg/dL   Creatinine, Ser 1.23 (H) 0.44 - 1.00 mg/dL   Calcium 8.4 (L) 8.9 - 10.3 mg/dL   Total Protein 8.0 6.5 - 8.1 g/dL   Albumin 2.6 (L) 3.5 - 5.0 g/dL   AST 26 15 - 41 U/L   ALT 11 (L) 14 - 54 U/L   Alkaline Phosphatase 171 (H) 38 - 126 U/L   Total Bilirubin 0.7 0.3 - 1.2 mg/dL   GFR calc non Af Amer 50 (L) >60 mL/min   GFR calc Af Amer 57 (L) >60 mL/min    Comment: (NOTE) The eGFR has been calculated using the CKD EPI equation. This calculation has not been validated in all clinical situations. eGFR's persistently <60 mL/min signify possible Chronic Kidney Disease.    Anion gap 9 5 - 15  CBC with Differential     Status:  Abnormal   Collection Time: 02/13/17 11:25 PM  Result Value Ref Range   WBC 12.7 (H) 3.6 - 11.0 K/uL   RBC 3.33 (L) 3.80 - 5.20 MIL/uL   Hemoglobin 8.1 (L) 12.0 - 16.0 g/dL   HCT 24.6 (L) 35.0 - 47.0 %   MCV 73.8 (L) 80.0 - 100.0 fL   MCH 24.4 (L) 26.0 - 34.0 pg   MCHC 33.1 32.0 - 36.0 g/dL   RDW 15.2 (H) 11.5 - 14.5 %   Platelets 452 (H) 150 - 440 K/uL   Neutrophils Relative % 80 %   Neutro Abs 10.1 (H) 1.4 - 6.5 K/uL   Lymphocytes Relative 9 %   Lymphs Abs 1.2 1.0 - 3.6 K/uL   Monocytes Relative 10 %   Monocytes Absolute 1.3 (H) 0.2 - 0.9 K/uL   Eosinophils Relative 0 %   Eosinophils Absolute 0.0 0 - 0.7 K/uL   Basophils Relative 1 %   Basophils Absolute 0.1 0 - 0.1 K/uL  Protime-INR     Status: None   Collection Time: 02/13/17 11:25 PM  Result Value Ref Range   Prothrombin Time 15.0 11.4 - 15.2 seconds   INR 1.17   Lipase, blood     Status: None   Collection Time: 02/13/17 11:25 PM  Result Value Ref Range   Lipase 38 11 - 51 U/L  Brain natriuretic peptide - IF patient is dyspneic     Status: Abnormal   Collection Time: 02/13/17 11:25 PM  Result Value Ref Range   B Natriuretic Peptide 108.0 (H) 0.0 - 100.0 pg/mL  Troponin I     Status: None   Collection Time: 02/13/17 11:25 PM  Result Value Ref Range   Troponin I <0.03 <0.03 ng/mL  Procalcitonin     Status: None   Collection Time: 02/13/17 11:25 PM  Result Value Ref Range   Procalcitonin 1.95 ng/mL    Comment:        Interpretation: PCT > 0.5 ng/mL and <= 2 ng/mL: Systemic infection (sepsis) is possible, but other conditions are known to elevate PCT as well. (NOTE)         ICU PCT Algorithm               Non ICU PCT Algorithm    ----------------------------     ------------------------------         PCT < 0.25 ng/mL                 PCT < 0.1 ng/mL     Stopping of  antibiotics            Stopping of antibiotics       strongly encouraged.               strongly encouraged.    ----------------------------      ------------------------------       PCT level decrease by               PCT < 0.25 ng/mL       >= 80% from peak PCT       OR PCT 0.25 - 0.5 ng/mL          Stopping of antibiotics                                             encouraged.     Stopping of antibiotics           encouraged.    ----------------------------     ------------------------------       PCT level decrease by              PCT >= 0.25 ng/mL       < 80% from peak PCT        AND PCT >= 0.5 ng/mL             Continuing antibiotics                                              encouraged.       Continuing antibiotics            encouraged.    ----------------------------     ------------------------------     PCT level increase compared          PCT > 0.5 ng/mL         with peak PCT AND          PCT >= 0.5 ng/mL             Escalation of antibiotics                                          strongly encouraged.      Escalation of antibiotics        strongly encouraged.   Lactic acid, plasma     Status: None   Collection Time: 02/13/17 11:29 PM  Result Value Ref Range   Lactic Acid, Venous 1.2 0.5 - 1.9 mmol/L  Culture, blood (Routine x 2)     Status: None (Preliminary result)   Collection Time: 02/13/17 11:29 PM  Result Value Ref Range   Specimen Description BLOOD RT AC    Special Requests Blood Culture adequate volume    Culture NO GROWTH < 12 HOURS    Report Status PENDING   Culture, blood (Routine x 2)     Status: None (Preliminary result)   Collection Time: 02/13/17 11:29 PM  Result Value Ref Range   Specimen Description BLOOD L AC    Special Requests Blood Culture adequate volume    Culture NO GROWTH < 12 HOURS    Report Status PENDING   Urinalysis, Complete w Microscopic  Status: Abnormal   Collection Time: 02/13/17 11:29 PM  Result Value Ref Range   Color, Urine AMBER (A) YELLOW    Comment: BIOCHEMICALS MAY BE AFFECTED BY COLOR   APPearance CLOUDY (A) CLEAR   Specific Gravity, Urine 1.019 1.005 -  1.030   pH 5.0 5.0 - 8.0   Glucose, UA 50 (A) NEGATIVE mg/dL   Hgb urine dipstick SMALL (A) NEGATIVE   Bilirubin Urine NEGATIVE NEGATIVE   Ketones, ur NEGATIVE NEGATIVE mg/dL   Protein, ur 100 (A) NEGATIVE mg/dL   Nitrite NEGATIVE NEGATIVE   Leukocytes, UA NEGATIVE NEGATIVE   RBC / HPF 0-5 0 - 5 RBC/hpf   WBC, UA 0-5 0 - 5 WBC/hpf   Bacteria, UA RARE (A) NONE SEEN   Squamous Epithelial / LPF 0-5 (A) NONE SEEN   Mucous PRESENT    Amorphous Crystal PRESENT   TSH     Status: None   Collection Time: 02/13/17 11:59 PM  Result Value Ref Range   TSH 1.205 0.350 - 4.500 uIU/mL    Comment: Performed by a 3rd Generation assay with a functional sensitivity of <=0.01 uIU/mL.   Dg Chest Port 1 View  Result Date: 02/14/2017 CLINICAL DATA:  Sepsis.  Fever and altered mental status. EXAM: PORTABLE CHEST 1 VIEW COMPARISON:  01/25/2016 FINDINGS: Right central line in place, tip in the mid SVC. Prominence of the cardiac size may be accentuated by portable AP technique, there is mild cardiomegaly. No pulmonary edema, consolidation, pleural fluid or pneumothorax. No acute osseous abnormality is seen. IMPRESSION: Mild cardiomegaly, may be accentuated by technique. No acute abnormality. Right central line in place, tip in the mid SVC. Electronically Signed   By: Jeb Levering M.D.   On: 02/14/2017 00:09   Dg Foot 2 Views Right  Result Date: 02/14/2017 CLINICAL DATA:  Open wound and concern for osteomyelitis EXAM: RIGHT FOOT - 2 VIEW COMPARISON:  Right foot radiograph 02/21/2016 FINDINGS: There has been amputation of the right fourth and fifth rays at the mid metatarsal. There is extensive posttraumatic deformity at the Lisfranc joint. Multiple tarsal bones of also been resected. There is a large soft tissue ulceration at the plantar aspect of the foot. No definite osteolysis is seen, but irregular hypertrophy of the bone superior to the ulcer could obscure small areas of erosion. There is complete loss of the  plantar arch. IMPRESSION: 1. Large plantar soft tissue ulceration. No definite osteolysis of the underlying bone, though the degree of chronic hypertrophy in this area could obscure small areas of erosion. 2. Status post resection of the right fourth and fifth rays at the mid metatarsal shaft. Irregular appearance of the midfoot may also indicate partial resection of the tarsal bones. 3. 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 Electronically Signed   By: Ulyses Jarred M.D.   On: 02/14/2017 00:05    Review of Systems  Constitutional: Positive for chills, fever and malaise/fatigue.  HENT: Negative for sore throat and tinnitus.   Eyes: Negative for blurred vision and redness.  Respiratory: Negative for cough and shortness of breath.   Cardiovascular: Negative for chest pain, palpitations, orthopnea and PND.  Gastrointestinal: Negative for abdominal pain, diarrhea, nausea and vomiting.  Genitourinary: Negative for dysuria, frequency and urgency.  Musculoskeletal: Negative for joint pain and myalgias.  Skin: Negative for rash.       No lesions  Neurological: Negative for speech change, focal weakness and weakness.  Endo/Heme/Allergies: Does not bruise/bleed easily.  No temperature intolerance  Psychiatric/Behavioral: Negative for depression and suicidal ideas.    Blood pressure (!) 101/49, pulse (!) 102, temperature (!) 101.1 F (38.4 C), temperature source Oral, resp. rate 16, height 5' 8"  (1.727 m), weight 92.8 kg (204 lb 8 oz), SpO2 98 %. Physical Exam  Nursing note and vitals reviewed. Constitutional: She is oriented to person, place, and time. She appears well-developed and well-nourished. No distress.  HENT:  Head: Normocephalic and atraumatic.  Mouth/Throat: Oropharynx is clear and moist.  Eyes: Conjunctivae and EOM are normal. Pupils are equal, round, and reactive to light. No scleral icterus.  Neck: Normal range of motion. Neck supple. No JVD present. No tracheal  deviation present. No thyromegaly present.  Cardiovascular: Normal rate, regular rhythm and normal heart sounds.  Exam reveals no gallop and no friction rub.   No murmur heard. Respiratory: Effort normal and breath sounds normal. No respiratory distress. She has no wheezes.  GI: Soft. Bowel sounds are normal. She exhibits no distension. There is no tenderness.  Genitourinary:  Genitourinary Comments: Deferred  Musculoskeletal: Normal range of motion.  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  Neurological: She is alert and oriented to person, place, and time. No cranial nerve deficit. She exhibits normal muscle tone.  Skin: Skin is warm and dry. No rash noted. No erythema.  Psychiatric: She has a normal mood and affect. Her behavior is normal. Judgment and thought content normal.     Assessment/Plan This is a 51 year old female admitted for sepsis. 1. Sepsis: The patient meets criteria via fever, tachycardia and leukocytosis. Initially her blood pressure was somewhat low but she is fluid responsive. She is not in septic shock at this time. Follow blood cultures for growth and sensitivities. Continue vancomycin and Zosyn. Also continue to fluid resuscitate. 2. Diabetes mellitus type 2: Continue basal insulin therapy. I have held metformin at this time. Sliding for insulin while hospitalized. I have consulted the surgical service for possible wound debridement and to evaluate for possible gangrene of her right foot. Continue aspirin 3. Essential hypertension: Controlled; continue to monitor 4. Asthma: Unspecified severity. Albuterol as needed 5. Hyperlipidemia: Continue statin therapy 6. Chronic pain: Continue amitriptyline and gabapentin 7. DVT prophylaxis: Heparin 8. GI prophylaxis: None The patient is a full code. Time spent on admission orders and patient care  approximately 45 minutes  Harrie Foreman, MD 02/14/2017, 7:43 AM

## 2017-02-14 NOTE — ED Notes (Signed)
Pt still has PICC Line in place from a couple of months ago (Right Chest)

## 2017-02-14 NOTE — ED Notes (Signed)
Pt on 3L oxygen nasal cannula

## 2017-02-14 NOTE — Progress Notes (Signed)
Patient transferred from the ED via stretcher to room 251. Patient is arousable but very sleepy and as soon as she is awaken she can answer and quickly falls back to sleep. Patient is able to state her name and date of birth and then falls back to sleep.   Moderate size Ulcer noted on bottom of right foot that is unmeasureable and unstageable. Wound bed has little to none circulation with yellow necrotic hard tissue. Surrounding skin is dry, and hard with little to no sensation. Cleansed wound bed with normal saline and placed a wet to dry dressing, covered with 4x4 gauze, wrapped with kerlex, and secured with paper tape.   Currently patient complains of no pain and as previously mentioned, patient is extremely sleepy. Has 1-2 liters of oxygen on for ED report Nurse states when she sleeps oxygen sats have known to decrease to the 80's. Currently 100% with 1-2 liters. Will continue to monitor patient to end of shift.

## 2017-02-14 NOTE — Progress Notes (Signed)
Patient seen early this morning. Patient admitted by Dr. Sheryle Hailiamond. Patient had IND yesterday by surgery. Patient needs podiatry evaluation. Patient needs ID consult and wound care consult. Patient needs ammonia level checked as she is still drowsy.  Continue vancomycin and Zosyn as planned by admitting physician  All other orders agree with admitting physician.

## 2017-02-14 NOTE — Progress Notes (Signed)
Patient is unable to stay awake long enough to answer and complete Admission questions and screening at this time. Will notify oncoming nurse.

## 2017-02-14 NOTE — ED Notes (Signed)
NaCl bolus taken off pump

## 2017-02-14 NOTE — Consult Note (Addendum)
SURGICAL CONSULTATION NOTE (initial) - cpt: 99254, 11043, 10060  HISTORY OF PRESENT ILLNESS (HPI):  51 y.o. female presented to [facility] ED overnight after her family could not reach her by phone, found her with acutely altered mental status, and called EMS. Patient responds with only one to a few words at a time before falling asleep, so much of her history is obtained from patient's medical record and supplemented by what can be garnered from patient. Patient states she has had a Right foot plantar wound for at least 4 months, likely >1 year, but she states she only noticed swelling ~1 week ago and says only she provides her own wound care. She denies any foot pain, though says her ankle hurts when moving her leg, and denies fever/chills, CP, or SOB. She also had a PICC placed at [facility] 2 months ago, but whether or not she's still receiving treatment is unclear. Patient also does not recall whether or how often she checks her blood glucose or receives/self-administers insulin.   Surgery is consulted by medical physician Dr. Sheryle Hail in this context for evaluation and management of Right diabetic foot wound.  PAST MEDICAL HISTORY (PMH):  Past Medical History:  Diagnosis Date  . Asthma   . Diabetes mellitus without complication (HCC)   . Fibromyalgia   . H/O percutaneous left heart catheterization   . Hypertension      PAST SURGICAL HISTORY (PSH):  History reviewed. No pertinent surgical history.   MEDICATIONS:  Prior to Admission medications   Medication Sig Start Date End Date Taking? Authorizing Provider  albuterol (PROVENTIL HFA;VENTOLIN HFA) 108 (90 Base) MCG/ACT inhaler Inhale 2 puffs into the lungs every 6 (six) hours as needed for wheezing or shortness of breath.   Yes [provider]  amitriptyline (ELAVIL) 100 MG tablet Take 100 mg by mouth at bedtime.   Yes [provider]  aspirin EC 81 MG tablet Take 81 mg by mouth daily.   Yes [provider]  atorvastatin  (LIPITOR) 40 MG tablet Take 40 mg by mouth at bedtime. 01/28/17 01/28/18 Yes [provider]  cetirizine (ZYRTEC) 10 MG tablet Take 10 mg by mouth at bedtime.    Yes [provider]  fluticasone (FLONASE) 50 MCG/ACT nasal spray Place 2 sprays into both nostrils daily as needed for rhinitis.    Yes [provider]  gabapentin (NEURONTIN) 800 MG tablet Take 800-2,400 mg by mouth 2 (two) times daily. Pt takes one tablet in the morning and three tablets at bedtime.   Yes [provider]  insulin aspart (NOVOLOG FLEXPEN) 100 UNIT/ML FlexPen Inject 7 Units into the skin 3 (three) times daily with meals.   Yes [provider]  insulin glargine (LANTUS) 100 unit/mL SOPN Inject 21 Units into the skin at bedtime. 01/28/17 04/10/17 Yes [provider]  metFORMIN (GLUCOPHAGE) 1000 MG tablet Take 1,000 mg by mouth 2 (two) times daily with a meal.   Yes [provider]  vitamin B-12 (CYANOCOBALAMIN) 1000 MCG tablet Take 1,000 mcg by mouth daily.   Yes [provider]     ALLERGIES:  Allergies  Allergen Reactions  . Ibuprofen Other (See Comments)    Reaction:  Acid reflux      SOCIAL HISTORY:  Social History   Social History  . Marital status: Single    Spouse name: N/A  . Number of children: N/A  . Years of education: N/A   Occupational History  . Not on file.   Social  History Main Topics  . Smoking status: Current Every Day Smoker    Packs/day: 1.00  . Smokeless tobacco: Never Used  . Alcohol use No  . Drug use: No  . Sexual activity: Not on file   Other Topics Concern  . Not on file   Social History Narrative  . No narrative on file    The patient currently resides (home / rehab facility / nursing home): Home  The patient normally is (ambulatory / bedbound): Self-reportedly wheelchair and bed-bound except for transfers on which she uses both feet  FAMILY HISTORY:  Family History  Problem Relation Age of Onset  .  Diabetes Mother   . Diabetes Sister    REVIEW OF SYSTEMS:  Constitutional: denies weight loss, fever, chills, or sweats  Eyes: denies any other vision changes, history of eye injury  ENT: denies sore throat, hearing problems  Respiratory: denies shortness of breath, wheezing  Cardiovascular: denies chest pain, palpitations  Gastrointestinal: denies abdominal pain, N/V, or diarrhea Genitourinary: denies burning with urination or urinary frequency Musculoskeletal: denies any other joint pains or cramps  Skin: denies any other rashes or skin discolorations except as per HPI Neurological: denies any other headache, dizziness, weakness  Psychiatric: denies any other depression, anxiety   All other review of systems were negative   VITAL SIGNS:  Temp:  [98.8 F (37.1 C)-103.2 F (39.6 C)] 98.8 F (37.1 C) (05/18 0355) Pulse Rate:  [100-126] 100 (05/18 0355) Resp:  [17-30] 19 (05/18 0355) BP: (86-149)/(40-99) 94/59 (05/18 0355) SpO2:  [90 %-100 %] 100 % (05/18 0355) Weight:  [202 lb (91.6 kg)] 202 lb (91.6 kg) (05/17 2309)     Height: 5\' 8"  (172.7 cm) Weight: 202 lb (91.6 kg) BMI (Calculated): 30.8   INTAKE/OUTPUT:  This shift: Total I/O In: 3249 [I.V.:1000; IV Piggyback:2249] Out: -   Last 2 shifts: @IOLAST2SHIFTS @   PHYSICAL EXAM:  Constitutional:  -- Obese body habitus  -- Mostly arousable  Eyes:  -- Pupils equally round and reactive to light  -- No scleral icterus  Ear, nose, and throat:  -- No jugular venous distension  Pulmonary:  -- No crackles  -- Equal breath sounds bilaterally -- Breathing non-labored at rest Cardiovascular:  -- S1, S2 present  -- No pericardial rubs Gastrointestinal:  -- Abdomen soft, overweight, nontender, nondistended, no guarding/rebound  -- No abdominal masses appreciated, pulsatile or otherwise  Musculoskeletal and Integumentary:  -- Wounds or skin discoloration: Right Charcot foot with 4 cm x 5 cm wound deep to muscle but probes into  abscess cavity with culture swab to bone after application of betadine to superficial wound in effort to minimize culture contamination, drainage of 30 mL only slightly malodorous frankly purulent fluid without any further pus able to be subsequently expressed, wound surrounded by what appears to be trimmed chronic calous (unclear trimmed by who) -- Extremities: B/L UE and LE FROM, hands and feet warm, unable to palpate pedal pulse attributable to edema, no Doppler available to assess signals  Neurologic:  -- Motor function: intact and symmetric -- Sensation: severely diminished at B/L feet (plantar worse than dorsum) with sensation becoming intact at ankle level with Right ankle tenderness to palpation  Labs:  CBC Latest Ref Rng & Units 02/13/2017 11/25/2016 11/21/2016  WBC 3.6 - 11.0 K/uL 12.7(H) 11.7(H) 9.2  Hemoglobin 12.0 - 16.0 g/dL 8.1(L) 10.2(L) 10.6(L)  Hematocrit 35.0 - 47.0 % 24.6(L) 30.3(L) 31.7(L)  Platelets 150 - 440 K/uL 452(H) 352 410   CMP Latest  Ref Rng & Units 02/13/2017 11/25/2016 11/21/2016  Glucose 65 - 99 mg/dL 811(B) - -  BUN 6 - 20 mg/dL 16 10 10   Creatinine 0.44 - 1.00 mg/dL 1.47(W) 2.95 6.21  Sodium 135 - 145 mmol/L 129(L) - -  Potassium 3.5 - 5.1 mmol/L 4.0 - -  Chloride 101 - 111 mmol/L 96(L) - -  CO2 22 - 32 mmol/L 24 - -  Calcium 8.9 - 10.3 mg/dL 3.0(Q) - -  Total Protein 6.5 - 8.1 g/dL 8.0 - -  Total Bilirubin 0.3 - 1.2 mg/dL 0.7 - -  Alkaline Phos 38 - 126 U/L 171(H) - -  AST 15 - 41 U/L 26 - -  ALT 14 - 54 U/L 11(L) - -    Imaging studies:  Right Foot X-ray (02/13/2017) 1. Large plantar soft tissue ulceration. No definite osteolysis of the underlying bone, though the degree of chronic hypertrophy in this area could obscure small areas of erosion. 2. Status post resection of the right fourth and fifth rays at the mid metatarsal shaft. Irregular appearance of the midfoot may also indicate partial resection of the tarsal bones. 3. 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  Assessment/Plan: (ICD-10's: L02.611, E11.621) 51 y.o. 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   - IV antibiotics per primary team  - Right foot abscess drainage performed bedside  - follow-up cultures obtained from deep tissue abscess  - patient informed she is at risk for foot/limb loss  - recommend podiatry consultation, will signoff  - medical management of comorbidities  - DVT prophylaxis  All of the above findings and recommendations were discussed with the patient and her RN, and all of patient's questions were answered to her expressed satisfaction.  Thank you for the opportunity to participate in this patient's care.   -- Scherrie Gerlach Earlene Plater, MD, RPVI [place]: [facility] General Surgery - Partnering for exceptional care. Office: [phone]

## 2017-02-14 NOTE — Progress Notes (Signed)
Pharmacy Antibiotic Note  Jackie ArgyleCathy D Miles is a 51 y.o. female admitted on 02/13/2017 with sepsis.  Pharmacy has been consulted for vancomycin and Zosyn dosing.  Plan: DW 75kg  Vd 53L ke 0.057 hr-1  T1/2 12 hours Vancomycin 1 gram  q 12 hours ordered with stacked dosing. Level before 5th dose. Goal trough 15-20.  Zosyn 3.375 grams q 8 hours ordered.  Height: 5\' 8"  (172.7 cm) Weight: 202 lb (91.6 kg) IBW/kg (Calculated) : 63.9  Temp (24hrs), Avg:103.1 F (39.5 C), Min:103 F (39.4 C), Max:103.2 F (39.6 C)   Recent Labs Lab 02/13/17 2325 02/13/17 2329  WBC 12.7*  --   CREATININE 1.23*  --   LATICACIDVEN  --  1.2    Estimated Creatinine Clearance: 63.3 mL/min (A) (by C-G formula based on SCr of 1.23 mg/dL (H)).    Allergies  Allergen Reactions  . Ibuprofen Other (See Comments)    Reaction:  Acid reflux     Antimicrobials this admission: vancomycin Zosyn 5/17 >>    >>   Dose adjustments this admission:   Microbiology results: 5/17 BCx: pending 5/17 UCx: pending       5/17 CXR: no acute abnormality 5/17 UA: (-)  Thank you for allowing pharmacy to be a part of this patient's care.  Jackie Miles S 02/14/2017 12:49 AM

## 2017-02-14 NOTE — Progress Notes (Signed)
Inpatient Diabetes Program Recommendations  AACE/ADA: New Consensus Statement on Inpatient Glycemic Control (2015)  Target Ranges:  Prepandial:   less than 140 mg/dL      Peak postprandial:   less than 180 mg/dL (1-2 hours)      Critically ill patients:  140 - 180 mg/dL   Lab Results  Component Value Date   GLUCAP 249 (H) 02/14/2017    Review of Glycemic Control  Results for Junius ArgyleCLARK, Tequilla D (MRN [id number]) as of 02/14/2017 11:30  Ref. Range 02/14/2017 07:45  Glucose-Capillary Latest Ref Range: 65 - 99 mg/dL 865249 (H)    Diabetes history: Type 2 Outpatient Diabetes medications: Novolog 7 units tid, Lantus 22 units qhs, Metformin 1000mg  bid.- confirmed with patient  Current orders for Inpatient glycemic control: Lantus 16 units qhs, Novolog 0-15 units tid  Inpatient Diabetes Program Recommendations:  Spoke to the patient and her 3 siblings HQ:IONGEXBMre:diabetes. Patient reveals she's been taking her Novolog after meals - demonstrated with visual diagram the mechanism of action of the 2 insulins- Novolog and Lantus. I have instructed her to take the Lantus every day at the same time and that generally if she skips a meal, she should skip taking the Novolog- she should discuss this with her MD on her next visit.    Susette RacerJulie Allard Lightsey, RN, BA, MHA, CDE Diabetes Coordinator Inpatient Diabetes Program  [phone] (Team Pager) [phone] [facility] Office) 02/14/2017 1:03 PM   She verbalizes understanding- very receptive. I have ordered the Living Well with diabetes book for her.

## 2017-02-14 NOTE — Consult Note (Signed)
WOC Nurse wound consult note Reason for Consult:Right plantar neuropathic ulcer, present on admission. [facility]  Charcot deformity noted.  Wound type:Chornic nonhealing neuropathic ulcer Pressure Injury POA: Yes Measurement: 4 cm x 5.2 cm x 4 cm calloused lesion with nonintact center Wound WUJ:WJXBJbed:Ruddy red Drainage (amount, consistency, odor) Moderate bloody effluent Periwound:Calloused circumferentially.  Dressing procedure/placement/frequency:Awaiting podiatry consult.  Cleanse with Ns and pat gently dry.  Fill wound with calcium alginate and cover with ABD pad/kerlix and tape.  Change daily and will await further orders from podiatry.  Will not follow at this time.  Please re-consult if needed.  Maple HudsonKaren Deandrae Wajda RN BSN CWON Pager [phone]

## 2017-02-14 NOTE — Consult Note (Signed)
Reason for Consult: Chronic diabetic ulceration right foot Referring Physician: [name] is an 51 y.o. female.  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 Over the last couple of years she has developed significant Charcot deformity. She has been seen over the past year mostly down at [facility] where she has had previous after below-knee amputation was recommended but she refused. Admitted to the emergency department yesterday with signs of sepsis.  Past Medical History:  Diagnosis Date  . Asthma   . Diabetes mellitus without complication ([place])   . Fibromyalgia   . H/O percutaneous left heart catheterization   . Hypertension     Past Surgical History:  Procedure Laterality Date  . toe amputation      Family History  Problem Relation Age of Onset  . Diabetes Mother   . Diabetes Sister     Social History:  reports that she has been smoking.  She has been smoking about 1.00 pack per day. She has never used smokeless tobacco. She reports that she does not drink alcohol or use drugs.  Allergies:  Allergies  Allergen Reactions  . Ibuprofen Other (See Comments)    Reaction:  Acid reflux     Medications:  Scheduled: . amitriptyline  100 mg Oral QHS  . aspirin EC  81 mg Oral Daily  . atorvastatin  40 mg Oral QHS  . docusate sodium  100 mg Oral BID  . gabapentin  2,400 mg Oral QHS  . gabapentin  800 mg Oral q morning - 10a  . heparin  5,000 Units Subcutaneous Q8H  . insulin aspart  0-15 Units Subcutaneous TID WC  . insulin aspart  0-5 Units Subcutaneous QHS  . insulin glargine  16 Units Subcutaneous QHS  . loratadine  10 mg Oral Daily  . vitamin B-12  1,000 mcg Oral Daily    Results for orders placed or performed during the hospital encounter of 02/13/17 (from the past 48 hour(s))  Comprehensive metabolic panel     Status: Abnormal   Collection Time: 02/13/17 11:25 PM  Result Value Ref Range   Sodium 129 (L)  135 - 145 mmol/L   Potassium 4.0 3.5 - 5.1 mmol/L   Chloride 96 (L) 101 - 111 mmol/L   CO2 24 22 - 32 mmol/L   Glucose, Bld 289 (H) 65 - 99 mg/dL   BUN 16 6 - 20 mg/dL   Creatinine, Ser 1.23 (H) 0.44 - 1.00 mg/dL   Calcium 8.4 (L) 8.9 - 10.3 mg/dL   Total Protein 8.0 6.5 - 8.1 g/dL   Albumin 2.6 (L) 3.5 - 5.0 g/dL   AST 26 15 - 41 U/L   ALT 11 (L) 14 - 54 U/L   Alkaline Phosphatase 171 (H) 38 - 126 U/L   Total Bilirubin 0.7 0.3 - 1.2 mg/dL   GFR calc non Af Amer 50 (L) >60 mL/min   GFR calc Af Amer 57 (L) >60 mL/min    Comment: (NOTE) The eGFR has been calculated using the CKD EPI equation. This calculation has not been validated in all clinical situations. eGFR's persistently <60 mL/min signify possible Chronic Kidney Disease.    Anion gap 9 5 - 15  CBC with Differential     Status: Abnormal   Collection Time: 02/13/17 11:25 PM  Result Value Ref Range   WBC 12.7 (H) 3.6 - 11.0 K/uL   RBC 3.33 (L) 3.80 - 5.20 MIL/uL  Hemoglobin 8.1 (L) 12.0 - 16.0 g/dL   HCT 24.6 (L) 35.0 - 47.0 %   MCV 73.8 (L) 80.0 - 100.0 fL   MCH 24.4 (L) 26.0 - 34.0 pg   MCHC 33.1 32.0 - 36.0 g/dL   RDW 15.2 (H) 11.5 - 14.5 %   Platelets 452 (H) 150 - 440 K/uL   Neutrophils Relative % 80 %   Neutro Abs 10.1 (H) 1.4 - 6.5 K/uL   Lymphocytes Relative 9 %   Lymphs Abs 1.2 1.0 - 3.6 K/uL   Monocytes Relative 10 %   Monocytes Absolute 1.3 (H) 0.2 - 0.9 K/uL   Eosinophils Relative 0 %   Eosinophils Absolute 0.0 0 - 0.7 K/uL   Basophils Relative 1 %   Basophils Absolute 0.1 0 - 0.1 K/uL  Protime-INR     Status: None   Collection Time: 02/13/17 11:25 PM  Result Value Ref Range   Prothrombin Time 15.0 11.4 - 15.2 seconds   INR 1.17   Lipase, blood     Status: None   Collection Time: 02/13/17 11:25 PM  Result Value Ref Range   Lipase 38 11 - 51 U/L  Brain natriuretic peptide - IF patient is dyspneic     Status: Abnormal   Collection Time: 02/13/17 11:25 PM  Result Value Ref Range   B Natriuretic  Peptide 108.0 (H) 0.0 - 100.0 pg/mL  Troponin I     Status: None   Collection Time: 02/13/17 11:25 PM  Result Value Ref Range   Troponin I <0.03 <0.03 ng/mL  Procalcitonin     Status: None   Collection Time: 02/13/17 11:25 PM  Result Value Ref Range   Procalcitonin 1.95 ng/mL    Comment:        Interpretation: PCT > 0.5 ng/mL and <= 2 ng/mL: Systemic infection (sepsis) is possible, but other conditions are known to elevate PCT as well. (NOTE)         ICU PCT Algorithm               Non ICU PCT Algorithm    ----------------------------     ------------------------------         PCT < 0.25 ng/mL                 PCT < 0.1 ng/mL     Stopping of antibiotics            Stopping of antibiotics       strongly encouraged.               strongly encouraged.    ----------------------------     ------------------------------       PCT level decrease by               PCT < 0.25 ng/mL       >= 80% from peak PCT       OR PCT 0.25 - 0.5 ng/mL          Stopping of antibiotics                                             encouraged.     Stopping of antibiotics           encouraged.    ----------------------------     ------------------------------       PCT level decrease by  PCT >= 0.25 ng/mL       < 80% from peak PCT        AND PCT >= 0.5 ng/mL             Continuing antibiotics                                              encouraged.       Continuing antibiotics            encouraged.    ----------------------------     ------------------------------     PCT level increase compared          PCT > 0.5 ng/mL         with peak PCT AND          PCT >= 0.5 ng/mL             Escalation of antibiotics                                          strongly encouraged.      Escalation of antibiotics        strongly encouraged.   Lactic acid, plasma     Status: None   Collection Time: 02/13/17 11:29 PM  Result Value Ref Range   Lactic Acid, Venous 1.2 0.5 - 1.9 mmol/L  Culture, blood  (Routine x 2)     Status: None (Preliminary result)   Collection Time: 02/13/17 11:29 PM  Result Value Ref Range   Specimen Description BLOOD RT AC    Special Requests Blood Culture adequate volume    Culture NO GROWTH < 12 HOURS    Report Status PENDING   Culture, blood (Routine x 2)     Status: None (Preliminary result)   Collection Time: 02/13/17 11:29 PM  Result Value Ref Range   Specimen Description BLOOD L AC    Special Requests Blood Culture adequate volume    Culture NO GROWTH < 12 HOURS    Report Status PENDING   Urinalysis, Complete w Microscopic     Status: Abnormal   Collection Time: 02/13/17 11:29 PM  Result Value Ref Range   Color, Urine AMBER (A) YELLOW    Comment: BIOCHEMICALS MAY BE AFFECTED BY COLOR   APPearance CLOUDY (A) CLEAR   Specific Gravity, Urine 1.019 1.005 - 1.030   pH 5.0 5.0 - 8.0   Glucose, UA 50 (A) NEGATIVE mg/dL   Hgb urine dipstick SMALL (A) NEGATIVE   Bilirubin Urine NEGATIVE NEGATIVE   Ketones, ur NEGATIVE NEGATIVE mg/dL   Protein, ur 100 (A) NEGATIVE mg/dL   Nitrite NEGATIVE NEGATIVE   Leukocytes, UA NEGATIVE NEGATIVE   RBC / HPF 0-5 0 - 5 RBC/hpf   WBC, UA 0-5 0 - 5 WBC/hpf   Bacteria, UA RARE (A) NONE SEEN   Squamous Epithelial / LPF 0-5 (A) NONE SEEN   Mucous PRESENT    Amorphous Crystal PRESENT   TSH     Status: None   Collection Time: 02/13/17 11:59 PM  Result Value Ref Range   TSH 1.205 0.350 - 4.500 uIU/mL    Comment: Performed by a 3rd Generation assay with a functional sensitivity of <=0.01 uIU/mL.  Aerobic Culture (superficial specimen)     Status:  None (Preliminary result)   Collection Time: 02/14/17 12:27 AM  Result Value Ref Range   Specimen Description FOOT RIGHT    Special Requests Normal    Gram Stain      ABUNDANT WBC PRESENT, PREDOMINANTLY PMN RARE SQUAMOUS EPITHELIAL CELLS PRESENT ABUNDANT GRAM POSITIVE COCCI IN PAIRS FEW GRAM NEGATIVE RODS FEW GRAM POSITIVE RODS Performed at [facility], [place]  [address] [place], [place] [address]    Culture PENDING    Report Status PENDING   Glucose, capillary     Status: Abnormal   Collection Time: 02/14/17  7:45 AM  Result Value Ref Range   Glucose-Capillary 249 (H) 65 - 99 mg/dL  Ammonia     Status: None   Collection Time: 02/14/17  9:09 AM  Result Value Ref Range   Ammonia 33 9 - 35 umol/L  Glucose, capillary     Status: Abnormal   Collection Time: 02/14/17 11:26 AM  Result Value Ref Range   Glucose-Capillary 198 (H) 65 - 99 mg/dL  Glucose, capillary     Status: Abnormal   Collection Time: 02/14/17  4:27 PM  Result Value Ref Range   Glucose-Capillary 269 (H) 65 - 99 mg/dL    Dg Chest Port 1 View  Result Date: 02/14/2017 CLINICAL DATA:  Sepsis.  Fever and altered mental status. EXAM: PORTABLE CHEST 1 VIEW COMPARISON:  01/25/2016 FINDINGS: Right central line in place, tip in the mid SVC. Prominence of the cardiac size may be accentuated by portable AP technique, there is mild cardiomegaly. No pulmonary edema, consolidation, pleural fluid or pneumothorax. No acute osseous abnormality is seen. IMPRESSION: Mild cardiomegaly, may be accentuated by technique. No acute abnormality. Right central line in place, tip in the mid SVC. Electronically Signed   By: Jeb Levering M.D.   On: 02/14/2017 00:09   Dg Foot 2 Views Right  Result Date: 02/14/2017 CLINICAL DATA:  Open wound and concern for osteomyelitis EXAM: RIGHT FOOT - 2 VIEW COMPARISON:  Right foot radiograph 02/21/2016 FINDINGS: There has been amputation of the right fourth and fifth rays at the mid metatarsal. There is extensive posttraumatic deformity at the Lisfranc joint. Multiple tarsal bones of also been resected. There is a large soft tissue ulceration at the plantar aspect of the foot. No definite osteolysis is seen, but irregular hypertrophy of the bone superior to the ulcer could obscure small areas of erosion. There is complete loss of the plantar arch. IMPRESSION: 1. Large plantar soft  tissue ulceration. No definite osteolysis of the underlying bone, though the degree of chronic hypertrophy in this area could obscure small areas of erosion. 2. Status post resection of the right fourth and fifth rays at the mid metatarsal shaft. Irregular appearance of the midfoot may also indicate partial resection of the tarsal bones. 3. 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 Electronically Signed   By: Ulyses Jarred M.D.   On: 02/14/2017 00:05    Review of Systems  Constitutional: Positive for chills, fever and malaise/fatigue.  HENT: Negative.   Eyes: Negative.   Respiratory: Negative.   Cardiovascular: Negative.   Gastrointestinal: Negative for nausea and vomiting.  Genitourinary: Negative.   Musculoskeletal: Negative.   Skin:       Chronic draining wound on the bottom of her right foot. Has had some increased swelling  Neurological:       Relates numbness in her lower extremities related to her diabetes  Endo/Heme/Allergies: Negative.   Psychiatric/Behavioral: Negative.    Blood  pressure 104/66, pulse 91, temperature 99.3 F (37.4 C), temperature source Oral, resp. rate 20, height _0  (1.727 m), weight 92.8 kg (204 lb 8 oz), SpO2 96 %. Physical Exam  Cardiovascular:  DP and PT pulses are palpable but diminished, 1 over 4 bilateral.  Musculoskeletal:  Rocker-bottom foot with obvious Charcot deformity on the right foot. Previous amputation of the right fourth and fifth toes and the left great toe.  Neurological:  Loss of protective threshold monofilament wire in both feet. Proprioception completely impaired.  Skin:  Significant edema in the right foot and lower extremity. A full-thickness ulceration with moderate purulent discharge on the plantar aspect of the right mid foot which probes down to the level of bone to a depth of approximately 2-3 cm.    Assessment/Plan: Assessment: 1. Chronic Charcot with osteomyelitis right midfoot. 2. Diabetes with associated  neuropathy.  Plan: Wet-to-dry packing reapplied into the wound on the plantar aspect of the right foot. Discussed with the patient that at this point no significant viable options for foot salvage are present from a podiatry standpoint. Would recommend that she consider below-knee amputation as previously discussed down at [facility]. At this point podiatry will sign off and would recommend surgery consult for amputation of the lower extremity  Jackie Miles 02/14/2017, 5:22 PM
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
## 2017-02-14 NOTE — Care Management (Addendum)
Patient presents with altered mental status.  Was found by family members and confused.  She has a chronic wounds right foot. diagnosed with sepsis. Extensive osteomyelitis lower extremities.  Had a PICC in place upon arrival to ED and this was removed.  Contacted [facility] infusion services to investigate whether patient was receiving home health IV antibiotics.  Patient was not on the census.   Has had I/D of wound by surgery.  Podiatry, surgery, ID consults.

## 2017-02-15 DIAGNOSIS — E13621 Other specified diabetes mellitus with foot ulcer: Secondary | ICD-10-CM

## 2017-02-15 DIAGNOSIS — L97512 Non-pressure chronic ulcer of other part of right foot with fat layer exposed: Secondary | ICD-10-CM

## 2017-02-15 DIAGNOSIS — L03115 Cellulitis of right lower limb: Secondary | ICD-10-CM

## 2017-02-15 LAB — HEMOGLOBIN A1C
Hgb A1c MFr Bld: 13.9 % — ABNORMAL HIGH (ref 4.8–5.6)
Mean Plasma Glucose: 352 mg/dL

## 2017-02-15 LAB — URINE CULTURE: Culture: NO GROWTH

## 2017-02-15 LAB — GLUCOSE, CAPILLARY
Glucose-Capillary: 157 mg/dL — ABNORMAL HIGH (ref 65–99)
Glucose-Capillary: 149 mg/dL — ABNORMAL HIGH (ref 65–99)

## 2017-02-15 LAB — SEDIMENTATION RATE: Sed Rate: 126 mm/hr — ABNORMAL HIGH (ref 0–30)

## 2017-02-15 LAB — CREATININE, SERUM
Creatinine, Ser: 0.82 mg/dL (ref 0.44–1.00)
GFR calc Af Amer: >60 mL/min (ref >60)
GFR calc non Af Amer: >60 mL/min (ref >60)

## 2017-02-15 LAB — C-REACTIVE PROTEIN: CRP: 25.4 mg/dL — AB (ref <1.0)

## 2017-02-15 MED ORDER — METRONIDAZOLE 500 MG PO TABS: 500.0000 mg | ORAL_TABLET | Freq: Three times a day (TID) | ORAL | 0 refills | Status: AC

## 2017-02-15 MED ORDER — VANCOMYCIN HCL IN DEXTROSE 1-5 GM/200ML-% IV SOLN: 1000.0000 mg | Freq: Two times a day (BID) | INTRAVENOUS | 0 refills | Status: AC

## 2017-02-15 MED ORDER — DEXTROSE 5 % IV SOLN: 2.0000 g | Freq: Two times a day (BID) | INTRAVENOUS | 0 refills | Status: AC

## 2017-02-15 NOTE — Discharge Summary (Signed)
[facility] - [place] at [facility]   PATIENT NAME: Jackie Miles    MR#:  [id number]  DATE OF BIRTH:  1966/07/10  DATE OF ADMISSION:  02/13/2017 ADMITTING PHYSICIAN: Arnaldo Natal, MD  DATE OF DISCHARGE: 02/15/2017  PRIMARY CARE PHYSICIAN: [facility]    ADMISSION DIAGNOSIS:  Cellulitis of foot, right [L03.115] Cellulitis of right lower leg [L03.115] Sepsis, due to unspecified organism [facility]) [A41.9] Diabetic ulcer of right foot associated with diabetes mellitus of other type, with fat layer exposed, unspecified part of foot (HCC) [W09.811, L97.512] Sepsis (HCC) [A41.9]  DISCHARGE DIAGNOSIS:  Active Problems:   Sepsis (HCC)   Pressure injury of skin   SECONDARY DIAGNOSIS:   Past Medical History:  Diagnosis Date  . Asthma   . Diabetes mellitus without complication (HCC)   . Fibromyalgia   . H/O percutaneous left heart catheterization   . Hypertension     HOSPITAL COURSE:  51 year old female with known osteomyelitis of right foot status post 6 weeks of IV daptomycin presenting again with sepsis due to foot infection.  1. Sepsis: This is due to Chronic Charcot with osteomyelitis right midfoot Patient has been afebrile. Patient has been evaluated by podiatry and infectious disease. Recommendations are for amputation. Patient adamantly refuses any sort of surgical procedure at this time and only wants to try IV antibiotics again. I have discussed with her that the chance of this healing is very unlikely. She accepts the risks.  2.Chronic Charcot with osteomyelitis right midfoot: As mentioned above patient was evaluated by ID and podiatry. She has refused amputation. She has PICC line placed and IV antibiotics with cefepime, vancomycin and oral Flagyl as per recommendations by infectious disease consultant. She will need weekly lab draws and vancomycin levels. She'll follow-up with Dr. Sampson Goon in 2 weeks.  3. Uncontrolled Type 2 diabetes with  neuropathy: Continue outpatient regimen with ADA diet A1c is 13.9 She needs close follow-up with PCP  4. Hyperlipidemia: Continue atorvastatin  5. Anemia of chronic disease: Patient will only need CBC followed up  Patient is at high risk for readmission. I spent over 30 minutes discussing that it is highly recommended that surgical procedure be performed on her foot, however as mentioned she is refusing.  DISCHARGE CONDITIONS AND DIET:   Stable for discharge on diabetic diet  CONSULTS OBTAINED:  Treatment Team:  Ancil Linsey, MD Mick Sell, MD Linus Galas, DPM  DRUG ALLERGIES:   Allergies  Allergen Reactions  . Ibuprofen Other (See Comments)    Reaction:  Acid reflux     DISCHARGE MEDICATIONS:   Current Discharge Medication List    START taking these medications   Details  ceFEPIme 2 g in dextrose 5 % 50 mL Inject 2 g into the vein every 12 (twelve) hours. Qty: 160 g, Refills: 0    metroNIDAZOLE (FLAGYL) 500 MG tablet Take 1 tablet (500 mg total) by mouth 3 (three) times daily. Qty: 126 tablet, Refills: 0    vancomycin (VANCOCIN) 1-5 GM/200ML-% SOLN Inject 200 mLs (1,000 mg total) into the vein every 12 (twelve) hours. Qty: 16800 mL, Refills: 0      CONTINUE these medications which have NOT CHANGED   Details  albuterol (PROVENTIL HFA;VENTOLIN HFA) 108 (90 Base) MCG/ACT inhaler Inhale 2 puffs into the lungs every 6 (six) hours as needed for wheezing or shortness of breath.    amitriptyline (ELAVIL) 100 MG tablet Take 100 mg by mouth at bedtime. Refills: 0  aspirin EC 81 MG tablet Take 81 mg by mouth daily.    atorvastatin (LIPITOR) 40 MG tablet Take 40 mg by mouth at bedtime.    cetirizine (ZYRTEC) 10 MG tablet Take 10 mg by mouth at bedtime.     fluticasone (FLONASE) 50 MCG/ACT nasal spray Place 2 sprays into both nostrils daily as needed for rhinitis.     gabapentin (NEURONTIN) 800 MG tablet Take 800-2,400 mg by mouth 2 (two) times daily. Pt  takes one tablet in the morning and three tablets at bedtime.    insulin aspart (NOVOLOG FLEXPEN) 100 UNIT/ML FlexPen Inject 7 Units into the skin 3 (three) times daily with meals.    insulin glargine (LANTUS) 100 unit/mL SOPN Inject 21 Units into the skin at bedtime.    metFORMIN (GLUCOPHAGE) 1000 MG tablet Take 1,000 mg by mouth 2 (two) times daily with a meal.    vitamin B-12 (CYANOCOBALAMIN) 1000 MCG tablet Take 1,000 mcg by mouth daily.          Today   CHIEF COMPLAINT:  No acute issues overnight   VITAL SIGNS:  Blood pressure 129/60, pulse (!) 118, temperature 97.8 F (36.6 C), resp. rate 16, height 5\' 8"  (1.727 m), weight 92.4 kg (203 lb 12 oz), SpO2 91 %.   REVIEW OF SYSTEMS:  Review of Systems  Constitutional: Negative.  Negative for chills, fever and malaise/fatigue.  HENT: Negative.  Negative for ear discharge, ear pain, hearing loss, nosebleeds and sore throat.   Eyes: Negative.  Negative for blurred vision and pain.  Respiratory: Negative.  Negative for cough, hemoptysis, shortness of breath and wheezing.   Cardiovascular: Negative.  Negative for chest pain, palpitations and leg swelling.  Gastrointestinal: Negative.  Negative for abdominal pain, blood in stool, diarrhea, nausea and vomiting.  Genitourinary: Negative.  Negative for dysuria.  Musculoskeletal: Negative.  Negative for back pain.  Skin:       Ulcer edema right foot  Neurological: Negative for dizziness, tremors, speech change, focal weakness, seizures and headaches.  Endo/Heme/Allergies: Negative.  Does not bruise/bleed easily.  Psychiatric/Behavioral: Negative.  Negative for depression, hallucinations and suicidal ideas.     PHYSICAL EXAMINATION:  GENERAL:  51 y.o.-year-old patient lying in the bed with no acute distress.  NECK:  Supple, no jugular venous distention. No thyroid enlargement, no tenderness.  LUNGS: Normal breath sounds bilaterally, no wheezing, rales,rhonchi  No use of  accessory muscles of respiration.  CARDIOVASCULAR: S1, S2 normal. No murmurs, rubs, or gallops.  ABDOMEN: Soft, non-tender, non-distended. Bowel sounds present. No organomegaly or mass.  EXTREMITIES: No pedal edema, cyanosis, or clubbing.  PSYCHIATRIC: The patient is alert and oriented x 3.  SKIN: Significant edema in the right foot and lower extremity with ulceration on the plantar aspect of right foot   DATA REVIEW:   CBC  Recent Labs Lab 02/13/17 2325  WBC 12.7*  HGB 8.1*  HCT 24.6*  PLT 452*    Chemistries   Recent Labs Lab 02/13/17 2325 02/15/17 0444  NA 129*  --   K 4.0  --   CL 96*  --   CO2 24  --   GLUCOSE 289*  --   BUN 16  --   CREATININE 1.23* 0.82  CALCIUM 8.4*  --   AST 26  --   ALT 11*  --   ALKPHOS 171*  --   BILITOT 0.7  --     Cardiac Enzymes  Recent Labs Lab 02/13/17 2325  TROPONINI <0.03  Microbiology Results  @[email or web address] @  RADIOLOGY:  Dg Chest Port 1 View  Result Date: 02/14/2017 CLINICAL DATA:  Sepsis.  Fever and altered mental status. EXAM: PORTABLE CHEST 1 VIEW COMPARISON:  01/25/2016 FINDINGS: Right central line in place, tip in the mid SVC. Prominence of the cardiac size may be accentuated by portable AP technique, there is mild cardiomegaly. No pulmonary edema, consolidation, pleural fluid or pneumothorax. No acute osseous abnormality is seen. IMPRESSION: Mild cardiomegaly, may be accentuated by technique. No acute abnormality. Right central line in place, tip in the mid SVC. Electronically Signed   By: Rubye Oaks M.D.   On: 02/14/2017 00:09   Dg Foot 2 Views Right  Result Date: 02/14/2017 CLINICAL DATA:  Open wound and concern for osteomyelitis EXAM: RIGHT FOOT - 2 VIEW COMPARISON:  Right foot radiograph 02/21/2016 FINDINGS: There has been amputation of the right fourth and fifth rays at the mid metatarsal. There is extensive posttraumatic deformity at the Lisfranc joint. Multiple tarsal bones of also been resected. There  is a large soft tissue ulceration at the plantar aspect of the foot. No definite osteolysis is seen, but irregular hypertrophy of the bone superior to the ulcer could obscure small areas of erosion. There is complete loss of the plantar arch. IMPRESSION: 1. Large plantar soft tissue ulceration. No definite osteolysis of the underlying bone, though the degree of chronic hypertrophy in this area could obscure small areas of erosion. 2. Status post resection of the right fourth and fifth rays at the mid metatarsal shaft. Irregular appearance of the midfoot may also indicate partial resection of the tarsal bones. 3. 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 Electronically Signed   By: Deatra Robinson M.D.   On: 02/14/2017 00:05      Current Discharge Medication List    START taking these medications   Details  ceFEPIme 2 g in dextrose 5 % 50 mL Inject 2 g into the vein every 12 (twelve) hours. Qty: 160 g, Refills: 0    metroNIDAZOLE (FLAGYL) 500 MG tablet Take 1 tablet (500 mg total) by mouth 3 (three) times daily. Qty: 126 tablet, Refills: 0    vancomycin (VANCOCIN) 1-5 GM/200ML-% SOLN Inject 200 mLs (1,000 mg total) into the vein every 12 (twelve) hours. Qty: 16800 mL, Refills: 0      CONTINUE these medications which have NOT CHANGED   Details  albuterol (PROVENTIL HFA;VENTOLIN HFA) 108 (90 Base) MCG/ACT inhaler Inhale 2 puffs into the lungs every 6 (six) hours as needed for wheezing or shortness of breath.    amitriptyline (ELAVIL) 100 MG tablet Take 100 mg by mouth at bedtime. Refills: 0    aspirin EC 81 MG tablet Take 81 mg by mouth daily.    atorvastatin (LIPITOR) 40 MG tablet Take 40 mg by mouth at bedtime.    cetirizine (ZYRTEC) 10 MG tablet Take 10 mg by mouth at bedtime.     fluticasone (FLONASE) 50 MCG/ACT nasal spray Place 2 sprays into both nostrils daily as needed for rhinitis.     gabapentin (NEURONTIN) 800 MG tablet Take 800-2,400 mg by mouth 2 (two) times  daily. Pt takes one tablet in the morning and three tablets at bedtime.    insulin aspart (NOVOLOG FLEXPEN) 100 UNIT/ML FlexPen Inject 7 Units into the skin 3 (three) times daily with meals.    insulin glargine (LANTUS) 100 unit/mL SOPN Inject 21 Units into the skin at bedtime.    metFORMIN (GLUCOPHAGE) 1000 MG tablet Take 1,000 mg  by mouth 2 (two) times daily with a meal.    vitamin B-12 (CYANOCOBALAMIN) 1000 MCG tablet Take 1,000 mcg by mouth daily.            Management plans discussed with the patient and she is in agreement. [facility]  Patient should follow up with pcp  CODE STATUS:     Code Status Orders        Start     Ordered   02/14/17 0422  Do not attempt resuscitation (DNR)  Continuous    Question Answer Comment  In the event of cardiac or respiratory ARREST Do not call a "code blue"   In the event of cardiac or respiratory ARREST Do not perform Intubation, CPR, defibrillation or ACLS   In the event of cardiac or respiratory ARREST Use medication by any route, position, wound care, and other measures to relive pain and suffering. May use oxygen, suction and manual treatment of airway obstruction as needed for comfort.      02/14/17 0421    Code Status History    Date Active Date Inactive Code Status Order ID Comments User Context   02/22/2016  2:08 PM 02/26/2016  3:22 PM DNR [id number]  Milagros LollSudini, Srikar, MD Inpatient   02/21/2016  6:35 PM 02/22/2016  2:08 PM Full Code [id number]  Altamese DillingVachhani, Vaibhavkumar, MD Inpatient   01/22/2016  6:13 PM 01/22/2016 11:49 PM Full Code [id number]  Auburn BilberryPatel, Shreyang, MD ED   01/12/2016  3:28 AM 01/14/2016  2:43 PM Full Code [id number]  Enedina FinnerPatel, Sona, MD ED      TOTAL TIME TAKING CARE OF THIS PATIENT: 37 minutes.    Note: This dictation was prepared with Dragon dictation along with smaller phrase technology. Any transcriptional errors that result from this process are unintentional.  Loreda Silverio M.D on 02/15/2017 at  8:39 AM  Between 7am to 6pm - Pager - [phone] After 6pm go to [email or web address] - Social research officer, governmentpassword [email or web address] [email or web address]  [facility] [place] [facility]  Office  [phone]  CC: Primary care physician; [facility]

## 2017-02-15 NOTE — Care Management Note (Signed)
Case Management Note  Patient Details  Name: Junius ArgyleCathy D Scalera MRN: [id number] Date of Birth: 28-Sep-1966  Subjective/Objective:      Discussed discharge planning with [name]. She chose [facility] to be her provider. PICC line was inserted to her left arm this morning. [facility], PT, SW, Aide. [name] does not have a Medicaid qualifying diagnosis for HH=PT per [facility]. Copy of scripts for IV ABX Cefepime and Vancom[facility] A nurse from [facility] will be at Ms Discher's home tomorrow to initiate her home IV antibiotic infusiions. Note to Brad that [name] also needs twice weekly BMP and weekly CBC with LFTs. Also need Vancomycin trough level on 02/17/17. Send results to Dr Sampson GoonFitzgerald. No other discharged needs identified. [facility] will provide all IX medications ordered to Ms Tall's residence.               Action/Plan:   Expected Discharge Date:  02/15/17               Expected Discharge Plan:   02/15/17  In-House Referral:     Discharge planning Services     Post Acute Care Choice:   [facility] Choice offered to:   Patient  DME Arranged:   NA DME Agency:     HH Arranged:   RN, PT, Aide, SW HH Agency:   [facility] for IV [facility]X via PICC line.   Status of Service:   completed.  If discussed at Long Length of Stay Meetings, dates discussed:    Additional Comments:  Joquan Lotz A, RN 02/15/2017, 12:00 PM

## 2017-02-15 NOTE — Progress Notes (Signed)
[facility] - [place] at [facility]   PATIENT NAME: Jackie Miles    MR#:  [id number]  DATE OF BIRTH:  12/12/1965  SUBJECTIVE:   Patient awake this am Absolutely does not want surgical evaluation/amputation  REVIEW OF SYSTEMS:    Review of Systems  Constitutional: Negative for fever, chills weight loss HENT: Negative for ear pain, nosebleeds, congestion, facial swelling, rhinorrhea, neck pain, neck stiffness and ear discharge.   Respiratory: Negative for cough, shortness of breath, wheezing  Cardiovascular: Negative for chest pain, palpitations and leg swelling.  Gastrointestinal: Negative for heartburn, abdominal pain, vomiting, diarrhea or consitpation Genitourinary: Negative for dysuria, urgency, frequency, hematuria Musculoskeletal: Negative for back pain or joint pain Neurological: Negative for dizziness, seizures, syncope, focal weakness,  numbness and headaches.  Hematological: Does not bruise/bleed easily.  Psychiatric/Behavioral: Negative for hallucinations, confusion, dysphoric mood She has chronic draining wound on the bottom of right foot swelling   Tolerating Diet: yes      DRUG ALLERGIES:   Allergies  Allergen Reactions  . Ibuprofen Other (See Comments)    Reaction:  Acid reflux     VITALS:  Blood pressure 129/60, pulse (!) 118, temperature 97.8 F (36.6 C), resp. rate 16, height 5\' 8"  (1.727 m), weight 92.4 kg (203 lb 12 oz), SpO2 91 %.  PHYSICAL EXAMINATION:  Constitutional: Appears well-developed and well-nourished. No distress. HENT: Normocephalic. Marland Kitchen. Oropharynx is clear and moist.  Eyes: Conjunctivae and EOM are normal. PERRLA, no scleral icterus.  Neck: Normal ROM. Neck supple. No JVD. No tracheal deviation. CVS: RRR, S1/S2 +, no murmurs, no gallops, no carotid bruit.  Pulmonary: Effort and breath sounds normal, no stridor, rhonchi, wheezes, rales.  Abdominal: Soft. BS +,  no distension, tenderness, rebound or guarding.   Musculoskeletal: Normal range of motion. No edema and no tenderness.  Neuro: Alert. CN 2-12 grossly intact. No focal deficits. SkinSignificant edema in the right foot and lower extremity with ulceration on the plantar aspect of right foot  Psychiatric: Normal mood and affect.      LABORATORY PANEL:   CBC  Recent Labs Lab 02/13/17 2325  WBC 12.7*  HGB 8.1*  HCT 24.6*  PLT 452*   ------------------------------------------------------------------------------------------------------------------  Chemistries   Recent Labs Lab 02/13/17 2325 02/15/17 0444  NA 129*  --   K 4.0  --   CL 96*  --   CO2 24  --   GLUCOSE 289*  --   BUN 16  --   CREATININE 1.23* 0.82  CALCIUM 8.4*  --   AST 26  --   ALT 11*  --   ALKPHOS 171*  --   BILITOT 0.7  --    ------------------------------------------------------------------------------------------------------------------  Cardiac Enzymes  Recent Labs Lab 02/13/17 2325  TROPONINI <0.03   ------------------------------------------------------------------------------------------------------------------  RADIOLOGY:  Dg Chest Port 1 View  Result Date: 02/14/2017 CLINICAL DATA:  Sepsis.  Fever and altered mental status. EXAM: PORTABLE CHEST 1 VIEW COMPARISON:  01/25/2016 FINDINGS: Right central line in place, tip in the mid SVC. Prominence of the cardiac size may be accentuated by portable AP technique, there is mild cardiomegaly. No pulmonary edema, consolidation, pleural fluid or pneumothorax. No acute osseous abnormality is seen. IMPRESSION: Mild cardiomegaly, may be accentuated by technique. No acute abnormality. Right central line in place, tip in the mid SVC. Electronically Signed   By: Rubye OaksMelanie  Ehinger M.D.   On: 02/14/2017 00:09   Dg Foot 2 Views Right  Result Date: 02/14/2017 CLINICAL DATA:  Open wound and  concern for osteomyelitis EXAM: RIGHT FOOT - 2 VIEW COMPARISON:  Right foot radiograph 02/21/2016 FINDINGS: There has  been amputation of the right fourth and fifth rays at the mid metatarsal. There is extensive posttraumatic deformity at the Lisfranc joint. Multiple tarsal bones of also been resected. There is a large soft tissue ulceration at the plantar aspect of the foot. No definite osteolysis is seen, but irregular hypertrophy of the bone superior to the ulcer could obscure small areas of erosion. There is complete loss of the plantar arch. IMPRESSION: 1. Large plantar soft tissue ulceration. No definite osteolysis of the underlying bone, though the degree of chronic hypertrophy in this area could obscure small areas of erosion. 2. Status post resection of the right fourth and fifth rays at the mid metatarsal shaft. Irregular appearance of the midfoot may also indicate partial resection of the tarsal bones. 3. 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 Electronically Signed   By: Deatra Robinson M.D.   On: 02/14/2017 00:05     ASSESSMENT AND PLAN:   51 year old female with known osteomyelitis of right foot status post 6 weeks of IV daptomycin presenting again with sepsis due to foot infection.  1. Sepsis: This is due to Chronic Charcot with osteomyelitis right midfoot Patient has been afebrile. Patient has been evaluated by podiatry and infectious disease. Recommendations are for amputation. Patient adamantly refuses any sort of surgical procedure at this time and only wants to try IV antibiotics again. I have discussed with her that the chance of this healing is very unlikely. She accepts the risks.  2.Chronic Charcot with osteomyelitis right midfoot: As mentioned above patient was evaluated by ID and podiatry. She has refused amputation. She will have PICC line placed and IV antibiotics with cefepime, vancomycin and oral Flagyl as per recommendations by infectious disease consultant. She will need weekly lab draws and vancomycin levels. She'll follow-up with Dr. Sampson Goon in 2 weeks.  3.  Uncontrolled Type 2 diabetes with neuropathy: Continue outpatient regimen with ADA diet A1c is 13.9 She needs close follow-up with PCP  4. Hyperlipidemia: Continue atorvastatin  5. Anemia of chronic disease: Patient will only need CBC followed up  Management plans discussed with the patient and she is in agreement.  CODE STATUS: full  TOTAL TIME TAKING CARE OF THIS PATIENT: 30 minutes.     POSSIBLE D/C today, DEPENDING ON PICC line  Lenia Housley M.D on 02/15/2017 at 8:31 AM  Between 7am to 6pm - Pager - [phone] After 6pm go to [email or web address] - Social research officer, government  [facility] [place] [facility]  Office  [phone]  CC: Primary care physician; [facility]  Note: This dictation was prepared with Dragon dictation along with smaller phrase technology. Any transcriptional errors that result from this process are unintentional.

## 2017-02-15 NOTE — Discharge Instructions (Signed)
Fever, Adult A fever is an increase in the body's temperature. It is often defined as a temperature of 100 F (38C) or higher. Short mild or moderate fevers often have no long-term effects. They also often do not need treatment. Moderate or high fevers may make you feel uncomfortable. Sometimes, they can also be a sign of a serious illness or disease. The sweating that may happen with repeated fevers or fevers that last a while may also cause you to not have enough fluid in your body (dehydration). You can take your temperature with a thermometer to see if you have a fever. A measured temperature can change with:  Age.  Time of day.  Where the thermometer is placed:  Mouth (oral).  Rectum (rectal).  Ear (tympanic).  Underarm (axillary).  Forehead (temporal). Follow these instructions at home: Pay attention to any changes in your symptoms. Take these actions to help with your condition:  Take over-the-counter and prescription medicines only as told by your doctor. Follow the dosing instructions carefully.  If you were prescribed an antibiotic medicine, take it as told by your doctor. Do not stop taking the antibiotic even if you start to feel better.  Rest as needed.  Drink enough fluid to keep your pee (urine) clear or pale yellow.  Sponge yourself or bathe with room-temperature water as needed. This helps to lower your body temperature . Do not use ice water.  Do not wear too many blankets or heavy clothes. Contact a doctor if:  You throw up (vomit).  You cannot eat or drink without throwing up.  You have watery poop (diarrhea).  It hurts when you pee.  Your symptoms do not get better with treatment.  You have new symptoms.  You feel very weak. Get help right away if:  You are short of breath or have trouble breathing.  You are dizzy or you pass out (faint).  You feel confused.  You have signs of not having enough fluid in your body, such as:  A dry  mouth.  Peeing less.  Looking pale.  You have very bad pain in your belly (abdomen).  You keep throwing up or having water poop.  You have a skin rash.  Your symptoms suddenly get worse. This information is not intended to replace advice given to you by your health care provider. Make sure you discuss any questions you have with your health care provider. Document Released: 06/25/2008 Document Revised: 02/22/2016 Document Reviewed: 11/10/2014 Elsevier Interactive Patient Education  2017 Elsevier Inc.  

## 2017-02-15 NOTE — Progress Notes (Signed)
Spoke with RN, [facility] and they are to place PICC.

## 2017-02-17 LAB — AEROBIC CULTURE  (SUPERFICIAL SPECIMEN): SPECIAL REQUESTS: NORMAL

## 2017-02-18 LAB — CULTURE, BLOOD (ROUTINE X 2)
Culture: NO GROWTH
Culture: NO GROWTH
SPECIAL REQUESTS: ADEQUATE
Special Requests: ADEQUATE

## 2017-03-20 DIAGNOSIS — L03115 Cellulitis of right lower limb: Secondary | ICD-10-CM

## 2017-03-20 DIAGNOSIS — L97512 Non-pressure chronic ulcer of other part of right foot with fat layer exposed: Secondary | ICD-10-CM

## 2017-03-20 DIAGNOSIS — E11621 Type 2 diabetes mellitus with foot ulcer: Secondary | ICD-10-CM

## 2017-05-27 ENCOUNTER — Encounter: Payer: Self-pay | Admitting: Emergency Medicine

## 2017-05-27 ENCOUNTER — Emergency Department: Payer: Medicaid Other

## 2017-05-27 ENCOUNTER — Inpatient Hospital Stay
Admission: EM | Admit: 2017-05-27 | Discharge: 2017-05-29 | DRG: 871 | Disposition: A | Discharge status: Home / Self Care | Payer: Medicaid Other | Attending: Internal Medicine | Admitting: Internal Medicine

## 2017-05-27 DIAGNOSIS — E1165 Type 2 diabetes mellitus with hyperglycemia: Secondary | ICD-10-CM | POA: Diagnosis present

## 2017-05-27 DIAGNOSIS — Z794 Long term (current) use of insulin: Secondary | ICD-10-CM

## 2017-05-27 DIAGNOSIS — F17210 Nicotine dependence, cigarettes, uncomplicated: Secondary | ICD-10-CM | POA: Diagnosis present

## 2017-05-27 DIAGNOSIS — E1142 Type 2 diabetes mellitus with diabetic polyneuropathy: Secondary | ICD-10-CM | POA: Diagnosis present

## 2017-05-27 DIAGNOSIS — Z79899 Other long term (current) drug therapy: Secondary | ICD-10-CM

## 2017-05-27 DIAGNOSIS — E1169 Type 2 diabetes mellitus with other specified complication: Secondary | ICD-10-CM | POA: Diagnosis present

## 2017-05-27 DIAGNOSIS — Z7982 Long term (current) use of aspirin: Secondary | ICD-10-CM

## 2017-05-27 DIAGNOSIS — D638 Anemia in other chronic diseases classified elsewhere: Secondary | ICD-10-CM | POA: Diagnosis present

## 2017-05-27 DIAGNOSIS — R739 Hyperglycemia, unspecified: Secondary | ICD-10-CM

## 2017-05-27 DIAGNOSIS — M869 Osteomyelitis, unspecified: Secondary | ICD-10-CM | POA: Diagnosis present

## 2017-05-27 DIAGNOSIS — N170 Acute kidney failure with tubular necrosis: Secondary | ICD-10-CM | POA: Diagnosis present

## 2017-05-27 DIAGNOSIS — E11621 Type 2 diabetes mellitus with foot ulcer: Secondary | ICD-10-CM | POA: Diagnosis present

## 2017-05-27 DIAGNOSIS — M8580 Other specified disorders of bone density and structure, unspecified site: Secondary | ICD-10-CM | POA: Diagnosis present

## 2017-05-27 DIAGNOSIS — E785 Hyperlipidemia, unspecified: Secondary | ICD-10-CM | POA: Diagnosis present

## 2017-05-27 DIAGNOSIS — E11610 Type 2 diabetes mellitus with diabetic neuropathic arthropathy: Secondary | ICD-10-CM | POA: Diagnosis present

## 2017-05-27 DIAGNOSIS — A419 Sepsis, unspecified organism: Secondary | ICD-10-CM | POA: Diagnosis present

## 2017-05-27 DIAGNOSIS — Z993 Dependence on wheelchair: Secondary | ICD-10-CM

## 2017-05-27 DIAGNOSIS — Z9119 Patient's noncompliance with other medical treatment and regimen: Secondary | ICD-10-CM

## 2017-05-27 DIAGNOSIS — L97512 Non-pressure chronic ulcer of other part of right foot with fat layer exposed: Secondary | ICD-10-CM | POA: Diagnosis present

## 2017-05-27 DIAGNOSIS — M797 Fibromyalgia: Secondary | ICD-10-CM | POA: Diagnosis present

## 2017-05-27 DIAGNOSIS — N179 Acute kidney failure, unspecified: Secondary | ICD-10-CM | POA: Diagnosis present

## 2017-05-27 DIAGNOSIS — Z9114 Patient's other noncompliance with medication regimen: Secondary | ICD-10-CM

## 2017-05-27 DIAGNOSIS — Z833 Family history of diabetes mellitus: Secondary | ICD-10-CM

## 2017-05-27 DIAGNOSIS — Z89421 Acquired absence of other right toe(s): Secondary | ICD-10-CM

## 2017-05-27 DIAGNOSIS — D509 Iron deficiency anemia, unspecified: Secondary | ICD-10-CM | POA: Diagnosis present

## 2017-05-27 DIAGNOSIS — J45909 Unspecified asthma, uncomplicated: Secondary | ICD-10-CM | POA: Diagnosis present

## 2017-05-27 DIAGNOSIS — I10 Essential (primary) hypertension: Secondary | ICD-10-CM | POA: Diagnosis present

## 2017-05-27 HISTORY — DX: Hyperlipidemia, unspecified: E78.5

## 2017-05-27 LAB — CBC WITH DIFFERENTIAL/PLATELET
BAND NEUTROPHILS: 1 %
BASOS PCT: 0 %
BLASTS: 0 %
Basophils Absolute: 0 10*3/uL (ref 0–0.1)
EOS ABS: 0.2 10*3/uL (ref 0–0.7)
EOS PCT: 1 %
HEMATOCRIT: 24.9 % — AB (ref 35.0–47.0)
Hemoglobin: 8.1 g/dL — ABNORMAL LOW (ref 12.0–16.0)
LYMPHS PCT: 19 %
Lymphs Abs: 3 10*3/uL (ref 1.0–3.6)
MCH: 24.3 pg — ABNORMAL LOW (ref 26.0–34.0)
MCHC: 32.6 g/dL (ref 32.0–36.0)
MCV: 74.7 fL — ABNORMAL LOW (ref 80.0–100.0)
MONO ABS: 1.1 10*3/uL — AB (ref 0.2–0.9)
MONOS PCT: 7 %
Metamyelocytes Relative: 2 %
Myelocytes: 1 %
NEUTROS ABS: 11.3 10*3/uL — AB (ref 1.4–6.5)
Neutrophils Relative %: 69 %
OTHER: 0 %
Platelets: 827 10*3/uL — ABNORMAL HIGH (ref 150–440)
Promyelocytes Absolute: 0 %
RBC: 3.34 MIL/uL — ABNORMAL LOW (ref 3.80–5.20)
RDW: 19.4 % — AB (ref 11.5–14.5)
WBC: 15.6 10*3/uL — ABNORMAL HIGH (ref 3.6–11.0)
nRBC: 0 /100 WBC

## 2017-05-27 LAB — COMPREHENSIVE METABOLIC PANEL
ALBUMIN: 2.5 g/dL — AB (ref 3.5–5.0)
ALT: 10 U/L — ABNORMAL LOW (ref 14–54)
ANION GAP: 12 (ref 5–15)
AST: 16 U/L (ref 15–41)
Alkaline Phosphatase: 147 U/L — ABNORMAL HIGH (ref 38–126)
BUN: 36 mg/dL — ABNORMAL HIGH (ref 6–20)
CHLORIDE: 94 mmol/L — AB (ref 101–111)
CO2: 23 mmol/L (ref 22–32)
Calcium: 11 mg/dL — ABNORMAL HIGH (ref 8.9–10.3)
Creatinine, Ser: 1.75 mg/dL — ABNORMAL HIGH (ref 0.44–1.00)
GFR calc Af Amer: 37 mL/min — ABNORMAL LOW (ref >60)
GFR calc non Af Amer: 32 mL/min — ABNORMAL LOW (ref >60)
GLUCOSE: 557 mg/dL — AB (ref 65–99)
POTASSIUM: 4.8 mmol/L (ref 3.5–5.1)
SODIUM: 129 mmol/L — AB (ref 135–145)
Total Bilirubin: 0.4 mg/dL (ref 0.3–1.2)
Total Protein: 9.3 g/dL — ABNORMAL HIGH (ref 6.5–8.1)

## 2017-05-27 LAB — GLUCOSE, CAPILLARY
GLUCOSE-CAPILLARY: 560 mg/dL — AB (ref 65–99)
Glucose-Capillary: 542 mg/dL (ref 65–99)
Glucose-Capillary: 510 mg/dL (ref 65–99)

## 2017-05-27 LAB — URINALYSIS, ROUTINE W REFLEX MICROSCOPIC
BACTERIA UA: NONE SEEN
BILIRUBIN URINE: NEGATIVE
HGB URINE DIPSTICK: NEGATIVE
KETONES UR: NEGATIVE mg/dL
Leukocytes, UA: NEGATIVE
NITRITE: NEGATIVE
PH: 5 (ref 5.0–8.0)
PROTEIN: 30 mg/dL — AB
Specific Gravity, Urine: 1.018 (ref 1.005–1.030)

## 2017-05-27 LAB — LACTIC ACID, PLASMA: Lactic Acid, Venous: 1.4 mmol/L (ref 0.5–1.9)

## 2017-05-27 LAB — PROTIME-INR
INR: 1.22
Prothrombin Time: 15.3 seconds — ABNORMAL HIGH (ref 11.4–15.2)

## 2017-05-27 MED ORDER — SODIUM CHLORIDE 0.9 % IV BOLUS (SEPSIS)
1000.0000 mL | Freq: Once | INTRAVENOUS | Status: AC
  Administered 2017-05-27: 1000 mL via INTRAVENOUS

## 2017-05-27 MED ORDER — VANCOMYCIN HCL IN DEXTROSE 1-5 GM/200ML-% IV SOLN
1000.0000 mg | Freq: Once | INTRAVENOUS | Status: AC
  Administered 2017-05-27: 1000 mg via INTRAVENOUS
  Filled 2017-05-27: qty 200

## 2017-05-27 MED ORDER — PIPERACILLIN-TAZOBACTAM 3.375 G IVPB 30 MIN
3.3750 g | Freq: Once | INTRAVENOUS | Status: AC
  Administered 2017-05-27: 3.375 g via INTRAVENOUS

## 2017-05-27 MED ORDER — INSULIN ASPART 100 UNIT/ML ~~LOC~~ SOLN
10.0000 [IU] | Freq: Once | SUBCUTANEOUS | Status: AC
  Administered 2017-05-27: 10 [IU] via INTRAVENOUS

## 2017-05-27 MED ORDER — INSULIN ASPART 100 UNIT/ML ~~LOC~~ SOLN
SUBCUTANEOUS | Status: AC
  Filled 2017-05-27: qty 1

## 2017-05-27 MED ORDER — INSULIN ASPART 100 UNIT/ML ~~LOC~~ SOLN
10.0000 [IU] | Freq: Once | SUBCUTANEOUS | Status: AC
  Administered 2017-05-27: 10 [IU] via INTRAVENOUS
  Filled 2017-05-27: qty 1

## 2017-05-27 MED ORDER — ACETAMINOPHEN 325 MG PO TABS
650.0000 mg | ORAL_TABLET | Freq: Once | ORAL | Status: AC
  Administered 2017-05-27: 650 mg via ORAL
  Filled 2017-05-27: qty 2

## 2017-05-27 MED ORDER — PIPERACILLIN-TAZOBACTAM 3.375 G IVPB 30 MIN
INTRAVENOUS | Status: AC
  Administered 2017-05-27: 3.375 g via INTRAVENOUS
  Filled 2017-05-27: qty 50

## 2017-05-27 NOTE — ED Triage Notes (Signed)
Pt to triage via w/c; pt accomp by cousin who reports pt with lethargy, weakness, frequent hosp for right foot infection but noncompliant with medication and f/u; pt denies any c/o but slow to answer questions

## 2017-05-27 NOTE — ED Notes (Signed)
Resumed care from stephen rn.  Pt sleeping.  Sinus tach on monitor.  Iv fluids infusing.  Family with pt.

## 2017-05-27 NOTE — ED Provider Notes (Signed)
[facility] Emergency Department Provider Note ____________________________________________   First MD Initiated Contact with Patient 05/27/17 2036     (approximate)  I have reviewed the triage vital signs and the nursing notes.   HISTORY  Chief Complaint Weakness and Fever  History of present illness limited by patient's altered mental status. Most history provided by patient's family members.   HPI Jackie Miles is a 51 y.o. female With history of diabetes and chronic right foot ulcer who presents with lethargy for 2 weeks, gradual onset, worsening course, associated with fever and with worsening of her right foot pain and ulcer.   Past Medical History:  Diagnosis Date  . Asthma   . Diabetes mellitus without complication (HCC)   . Fibromyalgia   . H/O percutaneous left heart catheterization   . Hypertension     Patient Active Problem List   Diagnosis Date Noted  . 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 Cellulitis of foot, right   . Pressure injury of skin 02/14/2017  . Osteomyelitis (HCC) 02/21/2016  . Foot infection 01/22/2016  . Sepsis (HCC) 01/12/2016    Past Surgical History:  Procedure Laterality Date  . toe amputation      Prior to Admission medications   Medication Sig Start Date End Date Taking? Authorizing Provider  albuterol (PROVENTIL HFA;VENTOLIN HFA) 108 (90 Base) MCG/ACT inhaler Inhale 2 puffs into the lungs every 6 (six) hours as needed for wheezing or shortness of breath.    [provider]  amitriptyline (ELAVIL) 100 MG tablet Take 100 mg by mouth at bedtime.    [provider]  aspirin EC 81 MG tablet Take 81 mg by mouth daily.    [provider]  atorvastatin (LIPITOR) 40 MG tablet Take 40 mg by mouth at bedtime. 01/28/17 01/28/18  [provider]  cetirizine (ZYRTEC) 10 MG tablet Take 10 mg by mouth at bedtime.     [provider]  fluticasone (FLONASE)  50 MCG/ACT nasal spray Place 2 sprays into both nostrils daily as needed for rhinitis.     [provider]  gabapentin (NEURONTIN) 800 MG tablet Take 800-2,400 mg by mouth 2 (two) times daily. Pt takes one tablet in the morning and three tablets at bedtime.    [provider]  insulin aspart (NOVOLOG FLEXPEN) 100 UNIT/ML FlexPen Inject 7 Units into the skin 3 (three) times daily with meals.    [provider]  metFORMIN (GLUCOPHAGE) 1000 MG tablet Take 1,000 mg by mouth 2 (two) times daily with a meal.    [provider]  vitamin B-12 (CYANOCOBALAMIN) 1000 MCG tablet Take 1,000 mcg by mouth daily.    [provider]    Allergies Ibuprofen  Family History  Problem Relation Age of Onset  . Diabetes Mother   . Diabetes Sister     Social History Social History  Substance Use Topics  . Smoking status: Current Every Day Smoker    Packs/day: 1.00  . Smokeless tobacco: Never Used  . Alcohol use No    Review of Systems Level V caveat: unable to obtain complete review of systems due to patient's altered mental status.    ____________________________________________   PHYSICAL EXAM:  VITAL SIGNS: ED Triage Vitals  Enc Vitals Group     BP 05/27/17 2009 (!) 109/58     Pulse Rate 05/27/17 2009 (!) 116     Resp --      Temp  05/27/17 2009 (!) 101.4 F (38.6 C)     Temp Source 05/27/17 2009 Oral     SpO2 05/27/17 2009 93 %     Weight 05/27/17 2022 200 lb (90.7 kg)     Height 05/27/17 2022 5\' 8"  (1.727 m)     Head Circumference --      Peak Flow --      Pain Score --      Pain Loc --      Pain Edu? --      Excl. in GC? --     Constitutional: Llethargic but arousable, answering questions appropriately but appears confused. Oriented 2-3 Eyes: Conjunctivae are normal.  PERRL.  Head: Atraumatic. Nose: No congestion/rhinnorhea. Mouth/Throat: dry mucous membranes. Neck: Normal range of motion.  Cardiovascular: tachycardic, regular  rhythm. Grossly normal heart sounds.  Good peripheral circulation. Respiratory: Normal respiratory effort.  No retractions. Scattered coarse breath sounds. Gastrointestinal: Soft and nontender. No distention.  Genitourinary: No CVA tenderness. Musculoskeletal:  Extremities warm and well perfused. Right foot with 2 cm deep ulcer to plantar surface, malodorous with purulent discharge and with surrounding erythema and edema.1+ DP pulse. No erythema or streaking above level of the foot. Neurologic:  Motor intact in all extremities.. No gross focal neurologic deficits are appreciated.  Skin:  Skin is warm and dry. No rash noted. Psychiatric: Lethargic, unable to fully assess.  ____________________________________________   LABS (all labs ordered are listed, but only abnormal results are displayed)  Labs Reviewed  GLUCOSE, CAPILLARY - Abnormal; Notable for the following:       Result Value   Glucose-Capillary 560 (*)    All other components within normal limits  COMPREHENSIVE METABOLIC PANEL - Abnormal; Notable for the following:    Sodium 129 (*)    Chloride 94 (*)    Glucose, Bld 557 (*)    BUN 36 (*)    Creatinine, Ser 1.75 (*)    Calcium 11.0 (*)    Total Protein 9.3 (*)    Albumin 2.5 (*)    ALT 10 (*)    Alkaline Phosphatase 147 (*)    GFR calc non Af Amer 32 (*)    GFR calc Af Amer 37 (*)    All other components within normal limits  CBC WITH DIFFERENTIAL/PLATELET - Abnormal; Notable for the following:    WBC 15.6 (*)    RBC 3.34 (*)    Hemoglobin 8.1 (*)    HCT 24.9 (*)    MCV 74.7 (*)    MCH 24.3 (*)    RDW 19.4 (*)    Platelets 827 (*)    Neutro Abs 11.3 (*)    Monocytes Absolute 1.1 (*)    All other components within normal limits  URINALYSIS, ROUTINE W REFLEX MICROSCOPIC - Abnormal; Notable for the following:    Color, Urine YELLOW (*)    APPearance CLOUDY (*)    Glucose, UA >=500 (*)    Protein, ur 30 (*)    Squamous Epithelial / LPF 0-5 (*)    All other  components within normal limits  PROTIME-INR - Abnormal; Notable for the following:    Prothrombin Time 15.3 (*)    All other components within normal limits  GLUCOSE, CAPILLARY - Abnormal; Notable for the following:    Glucose-Capillary 542 (*)    All other components within normal limits  GLUCOSE, CAPILLARY - Abnormal; Notable for the following:    Glucose-Capillary 510 (*)    All other components within normal limits  CULTURE, BLOOD (ROUTINE X 2)  CULTURE, BLOOD (ROUTINE X 2)  LACTIC ACID, PLASMA  BLOOD GAS, VENOUS  LACTIC ACID, PLASMA  CBG MONITORING, ED  CBG MONITORING, ED   ____________________________________________  EKG  ED ECG REPORT I, Dionne Bucy, the attending physician, personally viewed and interpreted this ECG.  Date: 05/27/2017 EKG Time: 2055 Rate: 114 Rhythm: sinus tachycardia, PVCs QRS Axis: normal Intervals: normal ST/T Wave abnormalities: normal Narrative Interpretation: no evidence of acute ischemia  ____________________________________________  RADIOLOGY  Chest x-ray with no acute findings, right foot x-rays consistent with osteomyelitis.  ____________________________________________   PROCEDURES  Procedure(s) performed: No    Critical Care performed: Yes  CRITICAL CARE Performed by: Dionne Bucy   Total critical care time: 45 minutes  Critical care time was exclusive of separately billable procedures and treating other patients.  Critical care was necessary to treat or prevent imminent or life-threatening deterioration.  Critical care was time spent personally by me on the following activities: development of treatment plan with patient and/or surrogate as well as nursing, discussions with consultants, evaluation of patient's response to treatment, examination of patient, obtaining history from patient or surrogate, ordering and performing treatments and interventions, ordering and review of laboratory studies, ordering  and review of radiographic studies, pulse oximetry and re-evaluation of patient's condition.  ____________________________________________   INITIAL IMPRESSION / ASSESSMENT AND PLAN / ED COURSE  Pertinent labs & imaging results that were available during my care of the patient were reviewed by me and considered in my medical decision making (see chart for details).  51 year old female history of diabetes and chronic right footulcer for which she has previously been on IV antibiotics via PICC presents with worsening lethargy over the last few weeks associated with worsening pain and drainage from the right foot wound. Per family members patient is poorly compliant with her diabetes medications and with antibiotics.On exam patient is tachycardic and febrile, other vital signs are within normal limits. She is lethargic but arousable and able to answer questions, and the neuro exam is nonfocal. Physical exam otherwise as described with purulent drainage from a deep right foot plantar ulcer. Presentation is consistent with sepsis most likely source being the right foot wound however cannot rule out other possible concomitant causes. Patient's glucose elevated to 500s which increases the likelihood of HHS or DKA. Plan: Sepsis workup, IV fluid resuscitation, cardiac monitoring, empiric antibiotics, x-ray of the right foot to evaluate for osteomyelitis, labs for DKA/HHS workup, and plan for admission.    ----------------------------------------- 11:45 PM on 05/27/2017 -----------------------------------------  Patient's workup reveals findings concerning for osteomyelitis.  No evidence of UTI or pneumonia.  Glucose remains elevated after fluids and initial IV insulin; will give additional IV insulin and continue fluids. IV antibiotics given. Patient signed out to hospitalist.   ____________________________________________   FINAL CLINICAL IMPRESSION(S) / ED DIAGNOSES  Final diagnoses:    Osteomyelitis of right foot, unspecified type (HCC)  Sepsis, due to unspecified organism (HCC)  Hyperglycemia      NEW MEDICATIONS STARTED DURING THIS VISIT:  New Prescriptions   No medications on file     Note:  This document was prepared using Dragon voice recognition software and may include unintentional dictation errors.    Dionne Bucy, MD 05/27/17 [phone]

## 2017-05-27 NOTE — ED Notes (Signed)
ED Provider at bedside. 

## 2017-05-28 ENCOUNTER — Encounter: Payer: Self-pay | Admitting: Internal Medicine

## 2017-05-28 DIAGNOSIS — A419 Sepsis, unspecified organism: Secondary | ICD-10-CM | POA: Diagnosis present

## 2017-05-28 DIAGNOSIS — Z993 Dependence on wheelchair: Secondary | ICD-10-CM | POA: Diagnosis not present

## 2017-05-28 DIAGNOSIS — D638 Anemia in other chronic diseases classified elsewhere: Secondary | ICD-10-CM | POA: Diagnosis present

## 2017-05-28 DIAGNOSIS — M869 Osteomyelitis, unspecified: Secondary | ICD-10-CM | POA: Diagnosis present

## 2017-05-28 DIAGNOSIS — Z794 Long term (current) use of insulin: Secondary | ICD-10-CM | POA: Diagnosis not present

## 2017-05-28 DIAGNOSIS — D509 Iron deficiency anemia, unspecified: Secondary | ICD-10-CM | POA: Diagnosis present

## 2017-05-28 DIAGNOSIS — J45909 Unspecified asthma, uncomplicated: Secondary | ICD-10-CM | POA: Diagnosis present

## 2017-05-28 DIAGNOSIS — E11610 Type 2 diabetes mellitus with diabetic neuropathic arthropathy: Secondary | ICD-10-CM | POA: Diagnosis present

## 2017-05-28 DIAGNOSIS — L97512 Non-pressure chronic ulcer of other part of right foot with fat layer exposed: Secondary | ICD-10-CM | POA: Diagnosis present

## 2017-05-28 DIAGNOSIS — M797 Fibromyalgia: Secondary | ICD-10-CM | POA: Diagnosis present

## 2017-05-28 DIAGNOSIS — R739 Hyperglycemia, unspecified: Secondary | ICD-10-CM | POA: Diagnosis present

## 2017-05-28 DIAGNOSIS — Z89421 Acquired absence of other right toe(s): Secondary | ICD-10-CM | POA: Diagnosis not present

## 2017-05-28 DIAGNOSIS — I10 Essential (primary) hypertension: Secondary | ICD-10-CM | POA: Diagnosis present

## 2017-05-28 DIAGNOSIS — M8580 Other specified disorders of bone density and structure, unspecified site: Secondary | ICD-10-CM | POA: Diagnosis present

## 2017-05-28 DIAGNOSIS — N179 Acute kidney failure, unspecified: Secondary | ICD-10-CM | POA: Diagnosis present

## 2017-05-28 DIAGNOSIS — E1169 Type 2 diabetes mellitus with other specified complication: Secondary | ICD-10-CM | POA: Diagnosis present

## 2017-05-28 DIAGNOSIS — Z9119 Patient's noncompliance with other medical treatment and regimen: Secondary | ICD-10-CM | POA: Diagnosis not present

## 2017-05-28 DIAGNOSIS — Z9114 Patient's other noncompliance with medication regimen: Secondary | ICD-10-CM | POA: Diagnosis not present

## 2017-05-28 DIAGNOSIS — Z7982 Long term (current) use of aspirin: Secondary | ICD-10-CM | POA: Diagnosis not present

## 2017-05-28 DIAGNOSIS — Z79899 Other long term (current) drug therapy: Secondary | ICD-10-CM | POA: Diagnosis not present

## 2017-05-28 DIAGNOSIS — N170 Acute kidney failure with tubular necrosis: Secondary | ICD-10-CM | POA: Diagnosis present

## 2017-05-28 DIAGNOSIS — F17210 Nicotine dependence, cigarettes, uncomplicated: Secondary | ICD-10-CM | POA: Diagnosis present

## 2017-05-28 DIAGNOSIS — E785 Hyperlipidemia, unspecified: Secondary | ICD-10-CM | POA: Diagnosis present

## 2017-05-28 DIAGNOSIS — E1142 Type 2 diabetes mellitus with diabetic polyneuropathy: Secondary | ICD-10-CM | POA: Diagnosis present

## 2017-05-28 DIAGNOSIS — E1165 Type 2 diabetes mellitus with hyperglycemia: Secondary | ICD-10-CM | POA: Diagnosis present

## 2017-05-28 DIAGNOSIS — E11621 Type 2 diabetes mellitus with foot ulcer: Secondary | ICD-10-CM | POA: Diagnosis present

## 2017-05-28 LAB — IRON AND TIBC
IRON: 8 ug/dL — AB (ref 28–170)
SATURATION RATIOS: 4 % — AB (ref 10.4–31.8)
TIBC: 183 ug/dL — AB (ref 250–450)
UIBC: 175 ug/dL

## 2017-05-28 LAB — CBC
HEMATOCRIT: 19.8 % — AB (ref 35.0–47.0)
Hemoglobin: 6.6 g/dL — ABNORMAL LOW (ref 12.0–16.0)
MCH: 24.6 pg — AB (ref 26.0–34.0)
MCHC: 33.5 g/dL (ref 32.0–36.0)
MCV: 73.6 fL — AB (ref 80.0–100.0)
PLATELETS: 700 10*3/uL — AB (ref 150–440)
RBC: 2.68 MIL/uL — ABNORMAL LOW (ref 3.80–5.20)
RDW: 18.8 % — AB (ref 11.5–14.5)
WBC: 13 10*3/uL — ABNORMAL HIGH (ref 3.6–11.0)

## 2017-05-28 LAB — GLUCOSE, CAPILLARY
GLUCOSE-CAPILLARY: 396 mg/dL — AB (ref 65–99)
GLUCOSE-CAPILLARY: 330 mg/dL — AB (ref 65–99)
GLUCOSE-CAPILLARY: 182 mg/dL — AB (ref 65–99)
Glucose-Capillary: 365 mg/dL — ABNORMAL HIGH (ref 65–99)
Glucose-Capillary: 317 mg/dL — ABNORMAL HIGH (ref 65–99)
Glucose-Capillary: 268 mg/dL — ABNORMAL HIGH (ref 65–99)

## 2017-05-28 LAB — BASIC METABOLIC PANEL
Anion gap: 6 (ref 5–15)
BUN: 31 mg/dL — AB (ref 6–20)
CHLORIDE: 105 mmol/L (ref 101–111)
CO2: 22 mmol/L (ref 22–32)
CREATININE: 1.38 mg/dL — AB (ref 0.44–1.00)
Calcium: 9.8 mg/dL (ref 8.9–10.3)
GFR calc Af Amer: 50 mL/min — ABNORMAL LOW (ref >60)
GFR calc non Af Amer: 43 mL/min — ABNORMAL LOW (ref >60)
GLUCOSE: 354 mg/dL — AB (ref 65–99)
POTASSIUM: 4.2 mmol/L (ref 3.5–5.1)
Sodium: 133 mmol/L — ABNORMAL LOW (ref 135–145)

## 2017-05-28 LAB — PREPARE RBC (CROSSMATCH)

## 2017-05-28 LAB — RETICULOCYTES
RBC.: 2.76 MIL/uL — AB (ref 3.80–5.20)
Retic Count, Absolute: 46.9 10*3/uL (ref 19.0–183.0)
Retic Ct Pct: 1.7 % (ref 0.4–3.1)

## 2017-05-28 LAB — FERRITIN: Ferritin: 151 ng/mL (ref 11–307)

## 2017-05-28 LAB — VITAMIN B12: Vitamin B-12: 1922 pg/mL — ABNORMAL HIGH (ref 180–914)

## 2017-05-28 LAB — FOLATE: FOLATE: 15.9 ng/mL (ref >5.9)

## 2017-05-28 MED ORDER — VANCOMYCIN HCL IN DEXTROSE 1-5 GM/200ML-% IV SOLN
1000.0000 mg | INTRAVENOUS | Status: DC
  Administered 2017-05-28: 05:00:00 1000 mg via INTRAVENOUS
  Filled 2017-05-28 (×2): qty 200

## 2017-05-28 MED ORDER — INSULIN ASPART 100 UNIT/ML ~~LOC~~ SOLN
0.0000 [IU] | Freq: Three times a day (TID) | SUBCUTANEOUS | Status: DC
  Administered 2017-05-28: 3 [IU] via SUBCUTANEOUS
  Administered 2017-05-28: 18:00:00 8 [IU] via SUBCUTANEOUS
  Administered 2017-05-28: 12:00:00 11 [IU] via SUBCUTANEOUS
  Administered 2017-05-28: 08:00:00 15 [IU] via SUBCUTANEOUS
  Administered 2017-05-29: 8 [IU] via SUBCUTANEOUS
  Filled 2017-05-28 (×5): qty 1

## 2017-05-28 MED ORDER — GABAPENTIN 400 MG PO CAPS
800.0000 mg | ORAL_CAPSULE | Freq: Every morning | ORAL | Status: DC
  Administered 2017-05-28 – 2017-05-29 (×2): 800 mg via ORAL
  Filled 2017-05-28 (×3): qty 2

## 2017-05-28 MED ORDER — ALBUTEROL SULFATE (2.5 MG/3ML) 0.083% IN NEBU: 2.5000 mg | INHALATION_SOLUTION | Freq: Four times a day (QID) | RESPIRATORY_TRACT | Status: DC | PRN

## 2017-05-28 MED ORDER — ATORVASTATIN CALCIUM 20 MG PO TABS
40.0000 mg | ORAL_TABLET | Freq: Every day | ORAL | Status: DC
  Administered 2017-05-28: 21:00:00 40 mg via ORAL
  Filled 2017-05-28: qty 2

## 2017-05-28 MED ORDER — INSULIN REGULAR HUMAN 100 UNIT/ML IJ SOLN
15.0000 [IU] | Freq: Once | INTRAMUSCULAR | Status: DC
  Filled 2017-05-28: qty 0.15

## 2017-05-28 MED ORDER — SODIUM CHLORIDE 0.9 % IV SOLN
Freq: Once | INTRAVENOUS | Status: AC
  Administered 2017-05-28: 18:00:00 via INTRAVENOUS

## 2017-05-28 MED ORDER — ACETAMINOPHEN 325 MG PO TABS
650.0000 mg | ORAL_TABLET | Freq: Four times a day (QID) | ORAL | Status: DC | PRN
  Administered 2017-05-28 (×2): 650 mg via ORAL
  Filled 2017-05-28 (×2): qty 2

## 2017-05-28 MED ORDER — ASPIRIN EC 81 MG PO TBEC
81.0000 mg | DELAYED_RELEASE_TABLET | Freq: Every day | ORAL | Status: DC
Start: 2017-05-28 — End: 2017-05-29
  Administered 2017-05-28 – 2017-05-29 (×2): 81 mg via ORAL
  Filled 2017-05-28 (×3): qty 1

## 2017-05-28 MED ORDER — AMITRIPTYLINE HCL 50 MG PO TABS
100.0000 mg | ORAL_TABLET | Freq: Every day | ORAL | Status: DC
Start: 2017-05-28 — End: 2017-05-29
  Administered 2017-05-28: 21:00:00 100 mg via ORAL
  Filled 2017-05-28: qty 2
  Filled 2017-05-28: qty 4

## 2017-05-28 MED ORDER — PIPERACILLIN-TAZOBACTAM 3.375 G IVPB
3.3750 g | Freq: Three times a day (TID) | INTRAVENOUS | Status: DC
  Administered 2017-05-28 – 2017-05-29 (×4): 3.375 g via INTRAVENOUS
  Filled 2017-05-28 (×4): qty 50

## 2017-05-28 MED ORDER — VANCOMYCIN HCL IN DEXTROSE 750-5 MG/150ML-% IV SOLN
750.0000 mg | Freq: Two times a day (BID) | INTRAVENOUS | Status: DC
  Administered 2017-05-28 – 2017-05-29 (×2): 750 mg via INTRAVENOUS
  Filled 2017-05-28 (×3): qty 150

## 2017-05-28 MED ORDER — ONDANSETRON HCL 4 MG PO TABS: 4.0000 mg | ORAL_TABLET | Freq: Four times a day (QID) | ORAL | Status: DC | PRN

## 2017-05-28 MED ORDER — INSULIN GLARGINE 100 UNIT/ML ~~LOC~~ SOLN
12.0000 [IU] | Freq: Every day | SUBCUTANEOUS | Status: DC
  Administered 2017-05-28 – 2017-05-29 (×2): 12 [IU] via SUBCUTANEOUS
  Filled 2017-05-28 (×2): qty 0.12

## 2017-05-28 MED ORDER — GABAPENTIN 400 MG PO CAPS
2400.0000 mg | ORAL_CAPSULE | Freq: Every day | ORAL | Status: DC
  Administered 2017-05-28: 2400 mg via ORAL
  Filled 2017-05-28: qty 6

## 2017-05-28 MED ORDER — ONDANSETRON HCL 4 MG/2ML IJ SOLN: 4.0000 mg | Freq: Four times a day (QID) | INTRAMUSCULAR | Status: DC | PRN

## 2017-05-28 MED ORDER — ENOXAPARIN SODIUM 40 MG/0.4ML ~~LOC~~ SOLN
40.0000 mg | SUBCUTANEOUS | Status: DC
  Administered 2017-05-28: 21:00:00 40 mg via SUBCUTANEOUS
  Filled 2017-05-28: qty 0.4

## 2017-05-28 MED ORDER — OXYCODONE HCL 5 MG PO TABS: 5.0000 mg | ORAL_TABLET | ORAL | Status: DC | PRN

## 2017-05-28 MED ORDER — ACETAMINOPHEN 650 MG RE SUPP: 650.0000 mg | Freq: Four times a day (QID) | RECTAL | Status: DC | PRN

## 2017-05-28 MED ORDER — SODIUM CHLORIDE 0.9 % IV SOLN
INTRAVENOUS | Status: AC
  Administered 2017-05-28: 05:00:00 via INTRAVENOUS

## 2017-05-28 NOTE — Progress Notes (Signed)
Blood transfusion started by Jesse Sans at [phone].  Febrile (100.5) at 2047.  Blood stopped at 2048 while waiting for on call MD to call back.   Patient feels cold but no other symptoms of transfusion reaction noted. Patient is septic r/t right foot osteomyelitis and is on IV antibiotics. Tylenol last given at 1600 & not available to be given until 2200.

## 2017-05-28 NOTE — ED Notes (Signed)
Pt waiting on admission. Pt asleep   Family with pt.

## 2017-05-28 NOTE — Progress Notes (Signed)
Blood transfusion restarted at 100 ml/hr at 2104.

## 2017-05-28 NOTE — ED Notes (Signed)
fsbs 365.

## 2017-05-28 NOTE — Progress Notes (Signed)
Spoke with Dr. Emmit PomfretHugelmeyer- does not think fever is related to blood. Verbal order read back and verified to give Tylenol 650mg  now and slow down blood transfusion.

## 2017-05-28 NOTE — ED Notes (Signed)
Pt transported to room 105 

## 2017-05-28 NOTE — Progress Notes (Signed)
ANTIBIOTIC CONSULT NOTE - INITIAL  Pharmacy Consult for Vancomycin , Zosyn  Indication: sepsis  Allergies  Allergen Reactions  . Ibuprofen Other (See Comments)    Reaction:  Acid reflux     Patient Measurements: Height: 5\' 8"  (172.7 cm) Weight: 196 lb 1.6 oz (89 kg) IBW/kg (Calculated) : 63.9 Adjusted Body Weight: 74 kg   Vital Signs: Temp: 98.5 F (36.9 C) (08/29 0216) Temp Source: Oral (08/29 0216) BP: 125/51 (08/29 0216) Pulse Rate: 106 (08/29 0216) Intake/Output from previous day: 08/28 0701 - 08/29 0700 In: 1050 [IV Piggyback:1050] Out: -  Intake/Output from this shift: Total I/O In: 1050 [IV Piggyback:1050] Out: -   Labs:  Recent Labs  05/27/17 2100  WBC 15.6*  HGB 8.1*  PLT 827*  CREATININE 1.75*   Estimated Creatinine Clearance: 43.4 mL/min (A) (by C-G formula based on SCr of 1.75 mg/dL (H)). No results for input(s): VANCOTROUGH, VANCOPEAK, VANCORANDOM, GENTTROUGH, GENTPEAK, GENTRANDOM, TOBRATROUGH, TOBRAPEAK, TOBRARND, AMIKACINPEAK, AMIKACINTROU, AMIKACIN in the last 72 hours.   Microbiology: No results found for this or any previous visit (from the past 720 hour(s)).  Medical History: Past Medical History:  Diagnosis Date  . Asthma   . Diabetes mellitus without complication (HCC)   . Fibromyalgia   . H/O percutaneous left heart catheterization   . HLD (hyperlipidemia)   . Hypertension     Medications:  Prescriptions Prior to Admission  Medication Sig Dispense Refill Last Dose  . albuterol (PROVENTIL HFA;VENTOLIN HFA) 108 (90 Base) MCG/ACT inhaler Inhale 2 puffs into the lungs every 6 (six) hours as needed for wheezing or shortness of breath.   prn at prn  . amitriptyline (ELAVIL) 100 MG tablet Take 100 mg by mouth at bedtime.  0 Past Week at Unknown time  . aspirin EC 81 MG tablet Take 81 mg by mouth daily.   Past Week at Unknown time  . atorvastatin (LIPITOR) 40 MG tablet Take 40 mg by mouth at bedtime.   Past Week at Unknown time  .  cetirizine (ZYRTEC) 10 MG tablet Take 10 mg by mouth at bedtime.    Past Week at Unknown time  . fluticasone (FLONASE) 50 MCG/ACT nasal spray Place 2 sprays into both nostrils daily as needed for rhinitis.    prn at prn  . gabapentin (NEURONTIN) 800 MG tablet Take 800-2,400 mg by mouth 2 (two) times daily. Pt takes one tablet in the morning and three tablets at bedtime.   Past Week at Unknown time  . insulin aspart (NOVOLOG FLEXPEN) 100 UNIT/ML FlexPen Inject 7 Units into the skin 3 (three) times daily with meals.   Past Week at Unknown time  . metFORMIN (GLUCOPHAGE) 1000 MG tablet Take 1,000 mg by mouth 2 (two) times daily with a meal.   Past Week at Unknown time  . vitamin B-12 (CYANOCOBALAMIN) 1000 MCG tablet Take 1,000 mcg by mouth daily.   Past Week at Unknown time   Assessment: CrCl = 43.4 ml/min Ke = 0.04 hr-1 T1/2 = 17.3 hrs Vd = 51.8 L   Goal of Therapy:  Vancomycin trough level 15-20 mcg/ml  Plan:  Expected duration 7 days with resolution of temperature and/or normalization of WBC   Zosyn 3.375 gm IV X 1 given on 8/28 @ 21:30.   Will order zosyn 3.375 gm IV Q8H EI to start on 8/29 @ 0400.   Vancomycin 1 gm IV give on 8/28 @ 21:30.  Vancomycin 1 gm IV Q18H ordered to start on 8/29 @ 0400, ~  6 hrs after 1st dose (stacked dosing).  This pt will reach Css by 9/1 @ 0900.  Will draw 1st trough on 9/1 @ 0330, which will be very close to Css.   Jamyia Fortune D 05/28/2017,3:05 AM

## 2017-05-28 NOTE — Consult Note (Signed)
Pharmacy Antibiotic Note  Jackie ArgyleCathy D Miles is a 51 y.o. female admitted on 05/27/2017 with osteomylitis.  Pharmacy has been consulted for vancomycin/zosyn dosing.  Plan: Due to the likelyhood that the patient will need long term IV abx, for easy of home dosing, I will change the vancoymcin dose for q 18 hours to 750mg  q 12 hr. Trough prior to the 5th new dose. Continue zosyn 3.375g q 8hr EI infusion  Height: 5\' 8"  (172.7 cm) Weight: 196 lb 1.6 oz (89 kg) IBW/kg (Calculated) : 63.9  Temp (24hrs), Avg:99.5 F (37.5 C), Min:98.5 F (36.9 C), Max:101.4 F (38.6 C)   Recent Labs Lab 05/27/17 2100 05/28/17 0501  WBC 15.6* 13.0*  CREATININE 1.75* 1.38*  LATICACIDVEN 1.4  --     Estimated Creatinine Clearance: 55 mL/min (A) (by C-G formula based on SCr of 1.38 mg/dL (H)).    Allergies  Allergen Reactions  . Ibuprofen Other (See Comments)    Reaction:  Acid reflux     Antimicrobials this admission: vancomycin 8/28 >>  zosyn 8/28 >>   Dose adjustments this admission:   Microbiology results: 8/28 BCx: NG <12hr   Thank you for allowing pharmacy to be a part of this patient's care.  Olene FlossMelissa D Vincen Bejar, Pharm.D, BCPS Clinical Pharmacist  05/28/2017 8:12 AM

## 2017-05-28 NOTE — Progress Notes (Addendum)
Inpatient Diabetes Program Recommendations  AACE/ADA: New Consensus Statement on Inpatient Glycemic Control (2015)  Target Ranges:  Prepandial:   less than 140 mg/dL      Peak postprandial:   less than 180 mg/dL (1-2 hours)      Critically ill patients:  140 - 180 mg/dL   Lab Results  Component Value Date   GLUCAP 317 (H) 05/28/2017   HGBA1C 13.9 (H) 02/14/2017    Review of Glycemic ControlResults for Junius ArgyleCLARK, Cornelious D (MRN [id number]) as of 05/28/2017 12:19  Ref. Range 05/27/2017 23:29 05/28/2017 00:56 05/28/2017 07:51 05/28/2017 10:08 05/28/2017 11:40  Glucose-Capillary Latest Ref Range: 65 - 99 mg/dL 409510 (HH) 811365 (H) 914396 (H) 330 (H) 317 (H)   Diabetes history: Type 2 diabetes Outpatient Diabetes medications: Novolog 7 units tid with meals, Metformin 1000 mg bid Current orders for Inpatient glycemic control:  Novolog moderate tid with meals and HS, Lantus 12 units daily  Inpatient Diabetes Program Recommendations:    Please consider increasing Lantus to 20 units daily.  Also consider adding Novolog meal coverage 5 units tid with meals- hold if patient eats less than 50%.  It appears that patient was on Lantus in the past, so not sure why it is not listed on medication reconciliation.  Will discuss with patient.   Thanks, Beryl MeagerJenny Cammi Consalvo, RN, BC-ADM Inpatient Diabetes Coordinator Pager [phone] ([phone])  Addendum 1:45- Briefly spoke to patient regarding home diabetes medications.  She states that she "was not" taking her insulin, however he family states that she was and that she is somewhat out of it today.  They state that she ran out last week but prior to that was taking Lantus and Novolog.  Will follow.

## 2017-05-28 NOTE — ED Notes (Signed)
Report called to justin rn floor nurse

## 2017-05-28 NOTE — H&P (Signed)
[facility] -  at [facility]   PATIENT NAME: Jackie Miles    MR#:  [id number]  DATE OF BIRTH:  08-07-1966  DATE OF ADMISSION:  05/27/2017  PRIMARY CARE PHYSICIAN: [facility]   REQUESTING/REFERRING PHYSICIAN: Marisa Severin, MD  CHIEF COMPLAINT:   Chief Complaint  Patient presents with  . Weakness  . Fever    HISTORY OF PRESENT ILLNESS:  Jackie Miles  is a 51 y.o. female who presents with fever. Patient has ongoing struggle with right diabetic foot ulcer. She has received long-term antibiotics for this in the past. She doesn't give much history today, family at bedside provides information for the history as well. It seems that she has had recurrent episodes of this wound reopening in the past. Whenever she gets close to healing completely, she will begin to walk on it, against advice according to her family members, and it will reopen. She meets sepsis criteria today. X-ray in the ED shows osteomyelitis in that foot.  PAST MEDICAL HISTORY:   Past Medical History:  Diagnosis Date  . Asthma   . Diabetes mellitus without complication (HCC)   . Fibromyalgia   . H/O percutaneous left heart catheterization   . HLD (hyperlipidemia)   . Hypertension     PAST SURGICAL HISTORY:   Past Surgical History:  Procedure Laterality Date  . toe amputation      SOCIAL HISTORY:   Social History  Substance Use Topics  . Smoking status: Current Every Day Smoker    Packs/day: 1.00  . Smokeless tobacco: Never Used  . Alcohol use No    FAMILY HISTORY:   Family History  Problem Relation Age of Onset  . Diabetes Mother   . Diabetes Sister     DRUG ALLERGIES:   Allergies  Allergen Reactions  . Ibuprofen Other (See Comments)    Reaction:  Acid reflux     MEDICATIONS AT HOME:   Prior to Admission medications   Medication Sig Start Date End Date Taking? Authorizing Provider  albuterol (PROVENTIL HFA;VENTOLIN HFA) 108 (90 Base) MCG/ACT  inhaler Inhale 2 puffs into the lungs every 6 (six) hours as needed for wheezing or shortness of breath.    [provider]  amitriptyline (ELAVIL) 100 MG tablet Take 100 mg by mouth at bedtime.    [provider]  aspirin EC 81 MG tablet Take 81 mg by mouth daily.    [provider]  atorvastatin (LIPITOR) 40 MG tablet Take 40 mg by mouth at bedtime. 01/28/17 01/28/18  [provider]  cetirizine (ZYRTEC) 10 MG tablet Take 10 mg by mouth at bedtime.     [provider]  fluticasone (FLONASE) 50 MCG/ACT nasal spray Place 2 sprays into both nostrils daily as needed for rhinitis.     [provider]  gabapentin (NEURONTIN) 800 MG tablet Take 800-2,400 mg by mouth 2 (two) times daily. Pt takes one tablet in the morning and three tablets at bedtime.    [provider]  insulin aspart (NOVOLOG FLEXPEN) 100 UNIT/ML FlexPen Inject 7 Units into the skin 3 (three) times daily with meals.    [provider]  metFORMIN (GLUCOPHAGE) 1000 MG tablet Take 1,000 mg by mouth 2 (two) times daily with a meal.    [provider]  vitamin B-12 (CYANOCOBALAMIN) 1000 MCG tablet Take 1,000 mcg by mouth daily.    [provider]    REVIEW OF SYSTEMS:  Review of Systems  Constitutional: Positive for  fever and malaise/fatigue. Negative for chills and weight loss.  HENT: Negative for ear pain, hearing loss and tinnitus.   Eyes: Negative for blurred vision, double vision, pain and redness.  Respiratory: Negative for cough, hemoptysis and shortness of breath.   Cardiovascular: Negative for chest pain, palpitations, orthopnea and leg swelling.  Gastrointestinal: Negative for abdominal pain, constipation, diarrhea, nausea and vomiting.  Genitourinary: Negative for dysuria, frequency and hematuria.  Musculoskeletal: Negative for back pain, joint pain and neck pain.       Right foot pain  Skin:       Right foot ulcer with drainage   Neurological: Negative for dizziness, tremors, focal weakness and weakness.  Endo/Heme/Allergies: Negative for polydipsia. Does not bruise/bleed easily.  Psychiatric/Behavioral: Negative for depression. The patient is not nervous/anxious and does not have insomnia.      VITAL SIGNS:   Vitals:   05/27/17 2300 05/27/17 2315 05/27/17 2330 05/27/17 2345  BP: 101/65 (!) 113/58 113/64 112/60  Pulse: (!) 107 (!) 106 (!) 103 (!) 101  Resp: (!) 21 20 (!) 24 20  Temp:      TempSrc:      SpO2: 97%   96%  Weight:      Height:       Wt Readings from Last 3 Encounters:  05/27/17 85.7 kg (188 lb 15 oz)  02/15/17 92.4 kg (203 lb 12 oz)  03/23/16 93 kg (205 lb)    PHYSICAL EXAMINATION:  Physical Exam  Vitals reviewed. Constitutional: She is oriented to person, place, and time. She appears well-developed and well-nourished. No distress.  HENT:  Head: Normocephalic and atraumatic.  Mouth/Throat: Oropharynx is clear and moist.  Eyes: Pupils are equal, round, and reactive to light. Conjunctivae and EOM are normal. No scleral icterus.  Neck: Normal range of motion. Neck supple. No JVD present. No thyromegaly present.  Cardiovascular: Regular rhythm and intact distal pulses.  Exam reveals no gallop and no friction rub.   No murmur heard. tachycardic  Respiratory: Effort normal and breath sounds normal. No respiratory distress. She has no wheezes. She has no rales.  GI: Soft. Bowel sounds are normal. She exhibits no distension. There is no tenderness.  Musculoskeletal: Normal range of motion. She exhibits no edema.  Right foot ulcer with purulence  Lymphadenopathy:    She has no cervical adenopathy.  Neurological: She is alert and oriented to person, place, and time. No cranial nerve deficit.  No dysarthria, no aphasia  Skin: Skin is warm and dry. No rash noted. No erythema.  Psychiatric: She has a normal mood and affect. Her behavior is normal. Judgment and thought content normal.     LABORATORY PANEL:   CBC  Recent Labs Lab 05/27/17 2100  WBC 15.6*  HGB 8.1*  HCT 24.9*  PLT 827*   ------------------------------------------------------------------------------------------------------------------  Chemistries   Recent Labs Lab 05/27/17 2100  NA 129*  K 4.8  CL 94*  CO2 23  GLUCOSE 557*  BUN 36*  CREATININE 1.75*  CALCIUM 11.0*  AST 16  ALT 10*  ALKPHOS 147*  BILITOT 0.4   ------------------------------------------------------------------------------------------------------------------  Cardiac Enzymes No results for input(s): TROPONINI in the last 168 hours. ------------------------------------------------------------------------------------------------------------------  RADIOLOGY:  Dg Chest Port 1 View  Result Date: 05/27/2017 CLINICAL DATA:  51 year old female with sepsis. EXAM: PORTABLE CHEST 1 VIEW COMPARISON:  02/13/2017 and earlier. FINDINGS: AP view of the chest at 2108 hours. Single lumen right chest tunneled catheter has been removed. Stable cardiomegaly and mediastinal contours. Mildly lower lung volumes.  Stable pulmonary vascularity without overt edema. No pneumothorax, pleural effusion or confluent pulmonary opacity identified. No acute osseous abnormality identified. IMPRESSION: Stable cardiomegaly.  No acute cardiopulmonary abnormality. Electronically Signed   By: Odessa FlemingH  Hall M.D.   On: 05/27/2017 21:33   Dg Foot 2 Views Right  Result Date: 05/27/2017 CLINICAL DATA:  51 year old female with right foot plantar ulcer. Sepsis. EXAM: RIGHT FOOT - 2 VIEW COMPARISON:  02/13/2017 and earlier. FINDINGS: Two views. Chronic Neuropathic foot with chronic midfoot bony collapse and disorganization. New osteopenia along the anterior calcaneus and new area of cortical osteolysis since May. This is in proximity to the large gas containing plantar soft tissue wound. Continued severe generalized soft tissue swelling about the right foot. No tracking  subcutaneous gas. Previous partial fourth and fifth ray amputation. IMPRESSION: 1. Positive for Osteomyelitis at the anterior calcaneus. 2. 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 Electronically Signed   By: Odessa FlemingH  Hall M.D.   On: 05/27/2017 21:36    EKG:   Orders placed or performed during the hospital encounter of 05/27/17  . EKG 12-Lead  . EKG 12-Lead    IMPRESSION AND PLAN:  Principal Problem:   Sepsis (HCC) - IV antibiotics lactic acid was normal, blood pressure stable, cultures sent Active Problems:   Osteomyelitis (HCC) - due to her ulcer, antibiotics as above, culture sent, podiatry consult   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 layer exposed (HCC) - treatment as above, we will use bolus IV insulin for now to get her glucose down, and sliding scale insulin to maintain   AKI (acute kidney injury) (HCC) - IV fluids, monitor for improvement, avoid nephrotoxins   HTN (hypertension) - continue home meds   Asthma - home when necessary inhaler   HLD (hyperlipidemia) - home dose anti-lipid  All the records are reviewed and case discussed with ED provider. Management plans discussed with the patient and/or family.  DVT PROPHYLAXIS: SubQ lovenox  GI PROPHYLAXIS: None  ADMISSION STATUS: Inpatient  CODE STATUS: DNR Code Status History    Date Active Date Inactive Code Status Order ID Comments User Context   02/14/2017  4:21 AM 02/15/2017 10:17 PM DNR [id number]  Arnaldo Nataliamond, Michael S, MD Inpatient   02/22/2016  2:08 PM 02/26/2016  3:22 PM DNR [id number]  Milagros LollSudini, Srikar, MD Inpatient   02/21/2016  6:35 PM 02/22/2016  2:08 PM Full Code [id number]  Altamese DillingVachhani, Vaibhavkumar, MD Inpatient   01/22/2016  6:13 PM 01/22/2016 11:49 PM Full Code [id number]  Auburn BilberryPatel, Shreyang, MD ED   01/12/2016  3:28 AM 01/14/2016  2:43 PM Full Code [id number]  Enedina FinnerPatel, Sona, MD ED    Questions for Most Recent Historical Code Status (Order [id number])    Question Answer Comment   In the event of cardiac or  respiratory ARREST Do not call a "code blue"    In the event of cardiac or respiratory ARREST Do not perform Intubation, CPR, defibrillation or ACLS    In the event of cardiac or respiratory ARREST Use medication by any route, position, wound care, and other measures to relive pain and suffering. May use oxygen, suction and manual treatment of airway obstruction as needed for comfort.       TOTAL TIME TAKING CARE OF THIS PATIENT: 45 minutes.   Lindsie Simar FIELDING 05/28/2017, 12:41 AM  [facility]  Office  [phone]  CC: Primary care physician; [facility]  Note:  This document was prepared using Dragon voice recognition software and may  include unintentional dictation errors.

## 2017-05-28 NOTE — Progress Notes (Signed)
[facility] - [place] at [facility]   PATIENT NAME: Jackie Miles    MR#:  [id number]  DATE OF BIRTH:  12/31/1965  SUBJECTIVE:  CHIEF COMPLAINT:   Chief Complaint  Patient presents with  . Weakness  . Fever   - Feels very tired. According to sister, patient's appetite has been very poor. -Has chronic right foot plantar open wound, noncompliant with treatment and weightbearing restrictions at times. -Patient is a poor historian.  REVIEW OF SYSTEMS:  Review of Systems  Constitutional: Positive for malaise/fatigue. Negative for chills and fever.  HENT: Negative for congestion, ear discharge, hearing loss and nosebleeds.   Eyes: Positive for blurred vision. Negative for double vision.  Respiratory: Negative for cough, shortness of breath and wheezing.   Cardiovascular: Positive for leg swelling. Negative for chest pain and palpitations.  Gastrointestinal: Negative for abdominal pain, constipation, diarrhea, nausea and vomiting.  Genitourinary: Negative for dysuria and urgency.  Musculoskeletal: Positive for myalgias.  Neurological: Negative for dizziness, speech change, focal weakness, seizures and headaches.  Psychiatric/Behavioral: Negative for depression.    DRUG ALLERGIES:   Allergies  Allergen Reactions  . Ibuprofen Other (See Comments)    Reaction:  Acid reflux     VITALS:  Blood pressure 111/63, pulse (!) 108, temperature 98.9 F (37.2 C), temperature source Oral, resp. rate 18, height 5\' 8"  (1.727 m), weight 89 kg (196 lb 1.6 oz), SpO2 97 %.  PHYSICAL EXAMINATION:  Physical Exam  GENERAL:  51 y.o.-year-old patient lying in the bed with no acute distress. disheleved  appearing EYES: Pupils equal, round, reactive to light and accommodation. No scleral icterus. Extraocular muscles intact.  HEENT: Head atraumatic, normocephalic. Oropharynx and nasopharynx clear.  NECK:  Supple, no jugular venous distention. No thyroid enlargement, no tenderness.    LUNGS: Normal breath sounds bilaterally, no wheezing, rales,rhonchi or crepitation. No use of accessory muscles of respiration. Decreased bibasilar breath sounds CARDIOVASCULAR: S1, S2 normal. No  rubs, or gallops. 2/6 systolic murmur is present ABDOMEN: Soft, nontender, nondistended. Bowel sounds present. No organomegaly or mass.  EXTREMITIES: charcot joints at ankles, flat foot, right foot with plantar surface deep wound, exposing underling muscles, purulent drainage and foul odor present. 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 Muscle strength 5/5 in all extremities. Sensation intact. Gait not checked. Global weakness present PSYCHIATRIC: The patient is alert and oriented x 3.  SKIN: No obvious rash, lesion, or ulcer.    LABORATORY PANEL:   CBC  Recent Labs Lab 05/28/17 0501  WBC 13.0*  HGB 6.6*  HCT 19.8*  PLT 700*   ------------------------------------------------------------------------------------------------------------------  Chemistries   Recent Labs Lab 05/27/17 2100 05/28/17 0501  NA 129* 133*  K 4.8 4.2  CL 94* 105  CO2 23 22  GLUCOSE 557* 354*  BUN 36* 31*  CREATININE 1.75* 1.38*  CALCIUM 11.0* 9.8  AST 16  --   ALT 10*  --   ALKPHOS 147*  --   BILITOT 0.4  --    ------------------------------------------------------------------------------------------------------------------  Cardiac Enzymes No results for input(s): TROPONINI in the last 168 hours. ------------------------------------------------------------------------------------------------------------------  RADIOLOGY:  Dg Chest Port 1 View  Result Date: 05/27/2017 CLINICAL DATA:  51 year old female with sepsis. EXAM: PORTABLE CHEST 1 VIEW COMPARISON:  02/13/2017 and earlier. FINDINGS: AP view of the chest at 2108 hours. Single lumen right chest tunneled catheter has been removed. Stable cardiomegaly  and mediastinal contours. Mildly lower  lung volumes. Stable pulmonary vascularity without overt edema. No pneumothorax, pleural effusion or confluent pulmonary opacity identified. No acute osseous abnormality identified. IMPRESSION: Stable cardiomegaly.  No acute cardiopulmonary abnormality. Electronically Signed   By: Odessa FlemingH  Hall M.D.   On: 05/27/2017 21:33   Dg Foot 2 Views Right  Result Date: 05/27/2017 CLINICAL DATA:  51 year old female with right foot plantar ulcer. Sepsis. EXAM: RIGHT FOOT - 2 VIEW COMPARISON:  02/13/2017 and earlier. FINDINGS: Two views. Chronic Neuropathic foot with chronic midfoot bony collapse and disorganization. New osteopenia along the anterior calcaneus and new area of cortical osteolysis since May. This is in proximity to the large gas containing plantar soft tissue wound. Continued severe generalized soft tissue swelling about the right foot. No tracking subcutaneous gas. Previous partial fourth and fifth ray amputation. IMPRESSION: 1. Positive for Osteomyelitis at the anterior calcaneus. 2. 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 Electronically Signed   By: Odessa FlemingH  Hall M.D.   On: 05/27/2017 21:36    EKG:   Orders placed or performed during the hospital encounter of 05/27/17  . EKG 12-Lead  . EKG 12-Lead    ASSESSMENT AND PLAN:   51 year old female with past medical history significant for type 2 diabetes mellitus, chronic osteomyelitis of the right foot, ongoing smoking presents to hospital secondary to worsening purulent discharge and swelling of the right foot.  #1 right foot osteomyelitis-chronic infection, patient has declined amputation in the past. -She was on 6 weeks of antibiotics with vancomycin, Flagyl and ceftazidime from May to June, concerned that she was intermittently taking the antibiotics. Also not following nonweightbearing restrictions. -According to family her wound has worsened, though patient denies that. -Wound cultures, ID  consult and podiatry consult. -X-ray with chronic neuropathy changes to right foot, osteomyelitis of anterior calcaneus. Osteopenia noted as well  #2 diabetes mellitus with hyperglycemia-check A1c. Started on Lantus and also continue sliding scale insulin. -If her intake is better, will add NovoLog pre-meals.  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 Continue gabapentin. Seems like she is on pretty high doses  #4 acute on chronic anemia-hemoglobin stays around 8  at baseline. Has had drops in the past but never required transfusion. -If she needs surgery with such a drop, she will need transfusion. Anemia labs ordered. 2 units packed RBC transfusion ordered  #5 acute renal failure-secondary to ATN from her underlying sepsis. Continue gentle hydration at this time  #6 DVT prophylaxis-on Lovenox.  Patient is wheelchair bound at baseline    All the records are reviewed and case discussed with Care Management/Social Workerr. Management plans discussed with the patient, family and they are in agreement.  CODE STATUS: DNR  TOTAL TIME TAKING CARE OF THIS PATIENT: 39 minutes.   POSSIBLE D/C IN 3 DAYS, DEPENDING ON CLINICAL CONDITION.   Enid BaasKALISETTI,Charnise Lovan M.D on 05/28/2017 at 9:25 AM  Between 7am to 6pm - Pager - [phone]  After 6pm go to [email or web address] - Social research officer, [email or web address] [email or web address]  [facility] [place] [facility]  Office  [phone]  CC: Primary care physician; [facility]

## 2017-05-28 NOTE — Consult Note (Signed)
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 female with DM, uncontrolled, chronic R foot wound osteomyelitis  (followed at [facility] ID) admitted this time with fevers  She was seen by me in May for the same infection and it was advised she have amputation, however she refused. We dced on IV abx but she never followed up with me. Did eventually see [facility] ID and refused to see me as otpt. Has been noncomplaint with follow up. Continues to smoke and not manage her DM. On this admit temp up to 101. Wbc 16. Xray confirms osteomyelitis.   Past Medical History:  Diagnosis Date  . Asthma   . Diabetes mellitus without complication ([place])   . Fibromyalgia   . H/O percutaneous left heart catheterization   . HLD (hyperlipidemia)   . Hypertension    Past Surgical History:  Procedure Laterality Date  . toe amputation     Social History  Substance Use Topics  . Smoking status: Current Every Day Smoker    Packs/day: 1.00  . Smokeless tobacco: Never Used  . Alcohol use No   Family History  Problem Relation Age of Onset  . Diabetes Mother   . Diabetes Sister     Allergies:  Allergies  Allergen Reactions  . Ibuprofen Other (See Comments)    Reaction:  Acid reflux     Current antibiotics: Antibiotics Given (last 72 hours)    Date/Time Action Medication Dose Rate   05/27/17 2122 New Bag/Given   piperacillin-tazobactam (ZOSYN) IVPB 3.375 g 3.375 g 100 mL/hr   05/27/17 2135 New Bag/Given   vancomycin (VANCOCIN) IVPB 1000 mg/200 mL premix 1,000 mg 200 mL/hr   05/28/17 0500 New Bag/Given   vancomycin (VANCOCIN) IVPB 1000 mg/200 mL  premix 1,000 mg 200 mL/hr   05/28/17 0512 New Bag/Given   piperacillin-tazobactam (ZOSYN) IVPB 3.375 g 3.375 g 12.5 mL/hr   05/28/17 1037 New Bag/Given   piperacillin-tazobactam (ZOSYN) IVPB 3.375 g 3.375 g 12.5 mL/hr      MEDICATIONS: . amitriptyline  100 mg Oral QHS  . aspirin EC  81 mg Oral Daily  . atorvastatin  40 mg Oral QHS  . enoxaparin (LOVENOX) injection  40 mg Subcutaneous Q24H  . gabapentin  2,400 mg Oral QHS  . gabapentin  800 mg Oral q morning - 10a  . insulin aspart  0-15 Units Subcutaneous TID AC & HS  . insulin glargine  12 Units Subcutaneous Daily  . insulin regular  15 Units Intravenous Once    Review of Systems - 11 systems reviewed and negative per HPI   OBJECTIVE: Temp:  [98.5 F (36.9 C)-101.4 F (38.6 C)] 99.6 F (37.6 C) (08/29 1416) Pulse Rate:  [98-116] 103 (08/29 1416) Resp:  [15-24] 16 (08/29 1416) BP: (101-135)/(35-78) 101/35 (08/29 1416) SpO2:  [93 %-100 %] 100 % (08/29 1416) Weight:  [85.7 kg (188 lb 15 oz)-90.7 kg (200 lb)] 89 kg (196 lb 1.6 oz) (08/29 0214) Physical Exam  Constitutional:  oriented to person, place, and time. appears well-developed and well-nourished. obese HENT: [place]/AT, PERRLA, no scleral icterus  Mouth/Throat: Oropharynx is clear and moist. No oropharyngeal exudate.  Cardiovascular: Normal rate, regular rhythm and normal heart sounds. Pulmonary/Chest: Effort normal and breath sounds normal. No respiratory distress.  has no wheezes.  Neck = supple, no nuchal rigidity Abdominal: Soft. Bowel sounds are normal.  exhibits no distension. There is no tenderness.  Lymphadenopathy: no cervical adenopathy. No axillary adenopathy Neurological: alert and oriented to person, place, and time.  Ext R foot with charcot deformity, 3+ edema, warm and red Skin: plantar surface R foot with 3 cm open ulcer, deep, and probes to bone, extensive necrotic tissue with soupy brown copious drainage and odor L foot missing first 2 toes but no open  wounds Psychiatric: a normal mood and affect.  behavior is normal.    LABS: Results for orders placed or performed during the hospital encounter of 05/27/17 (from the past 48 hour(s))  Glucose, capillary     Status: Abnormal   Collection Time: 05/27/17  8:13 PM  Result Value Ref Range   Glucose-Capillary 560 (HH) 65 - 99 mg/dL   Comment 1 Notify RN   Blood gas, venous ([facility], [facility], [facility])     Status: None   Collection Time: 05/27/17  8:59 PM  Result Value Ref Range   pH, Ven 7.38 7.250 - 7.430   pCO2, Ven 44 44.0 - 60.0 mmHg   pO2, Ven PENDING 32.0 - 45.0 mmHg   Bicarbonate 26.0 20.0 - 28.0 mmol/L   Acid-Base Excess 0.7 0.0 - 2.0 mmol/L   O2 Saturation PENDING %   Patient temperature 37.0    Collection site VENOUS    Sample type VENOUS    Mechanical Rate PENDING   Comprehensive metabolic panel     Status: Abnormal   Collection Time: 05/27/17  9:00 PM  Result Value Ref Range   Sodium 129 (L) 135 - 145 mmol/L   Potassium 4.8 3.5 - 5.1 mmol/L   Chloride 94 (L) 101 - 111 mmol/L   CO2 23 22 - 32 mmol/L   Glucose, Bld 557 (HH) 65 - 99 mg/dL    Comment: CRITICAL RESULT CALLED TO, READ BACK BY AND VERIFIED WITH STEPHEN JONES AT 2159 ON 05/27/2017 JJB    BUN 36 (H) 6 - 20 mg/dL   Creatinine, Ser 1.75 (H) 0.44 - 1.00 mg/dL   Calcium 11.0 (H) 8.9 - 10.3 mg/dL   Total Protein 9.3 (H) 6.5 - 8.1 g/dL   Albumin 2.5 (L) 3.5 - 5.0 g/dL   AST 16 15 - 41 U/L   ALT 10 (L) 14 - 54 U/L   Alkaline Phosphatase 147 (H) 38 - 126 U/L   Total Bilirubin 0.4 0.3 - 1.2 mg/dL   GFR calc non Af Amer 32 (L) >60 mL/min   GFR calc Af Amer 37 (L) >60 mL/min    Comment: (NOTE) The eGFR has been calculated using the CKD EPI equation. This calculation has not been validated in all clinical situations. eGFR's persistently <60 mL/min signify possible Chronic Kidney Disease.    Anion gap 12 5 - 15  CBC WITH DIFFERENTIAL     Status: Abnormal   Collection Time: 05/27/17  9:00 PM  Result Value Ref Range   WBC  15.6 (H) 3.6 - 11.0 K/uL   RBC 3.34 (L) 3.80 - 5.20 MIL/uL   Hemoglobin 8.1 (L) 12.0 - 16.0 g/dL   HCT 24.9 (L) 35.0 - 47.0 %   MCV 74.7 (L) 80.0 - 100.0 fL   MCH 24.3 (L) 26.0 - 34.0  pg   MCHC 32.6 32.0 - 36.0 g/dL   RDW 19.4 (H) 11.5 - 14.5 %   Platelets 827 (H) 150 - 440 K/uL   Neutrophils Relative % 69 %   Lymphocytes Relative 19 %   Monocytes Relative 7 %   Eosinophils Relative 1 %   Basophils Relative 0 %   Band Neutrophils 1 %   Metamyelocytes Relative 2 %   Myelocytes 1 %   Promyelocytes Absolute 0 %   Blasts 0 %   nRBC 0 0 /100 WBC   Other 0 %   Neutro Abs 11.3 (H) 1.4 - 6.5 K/uL   Lymphs Abs 3.0 1.0 - 3.6 K/uL   Monocytes Absolute 1.1 (H) 0.2 - 0.9 K/uL   Eosinophils Absolute 0.2 0 - 0.7 K/uL   Basophils Absolute 0.0 0 - 0.1 K/uL   Smear Review MORPHOLOGY UNREMARKABLE   Blood Culture (routine x 2)     Status: None (Preliminary result)   Collection Time: 05/27/17  9:00 PM  Result Value Ref Range   Specimen Description BLOOD LAC    Special Requests      BOTTLES DRAWN AEROBIC AND ANAEROBIC Blood Culture results may not be optimal due to an excessive volume of blood received in culture bottles   Culture NO GROWTH < 12 HOURS    Report Status PENDING   Blood Culture (routine x 2)     Status: None (Preliminary result)   Collection Time: 05/27/17  9:00 PM  Result Value Ref Range   Specimen Description BLOOD RAC    Special Requests      BOTTLES DRAWN AEROBIC AND ANAEROBIC Blood Culture adequate volume   Culture NO GROWTH < 12 HOURS    Report Status PENDING   Lactic acid, plasma     Status: None   Collection Time: 05/27/17  9:00 PM  Result Value Ref Range   Lactic Acid, Venous 1.4 0.5 - 1.9 mmol/L  Protime-INR     Status: Abnormal   Collection Time: 05/27/17  9:00 PM  Result Value Ref Range   Prothrombin Time 15.3 (H) 11.4 - 15.2 seconds   INR 1.22   Urinalysis, Routine w reflex microscopic     Status: Abnormal   Collection Time: 05/27/17  9:02 PM  Result Value  Ref Range   Color, Urine YELLOW (A) YELLOW   APPearance CLOUDY (A) CLEAR   Specific Gravity, Urine 1.018 1.005 - 1.030   pH 5.0 5.0 - 8.0   Glucose, UA >=500 (A) NEGATIVE mg/dL   Hgb urine dipstick NEGATIVE NEGATIVE   Bilirubin Urine NEGATIVE NEGATIVE   Ketones, ur NEGATIVE NEGATIVE mg/dL   Protein, ur 30 (A) NEGATIVE mg/dL   Nitrite NEGATIVE NEGATIVE   Leukocytes, UA NEGATIVE NEGATIVE   RBC / HPF 0-5 0 - 5 RBC/hpf   WBC, UA 0-5 0 - 5 WBC/hpf   Bacteria, UA NONE SEEN NONE SEEN   Squamous Epithelial / LPF 0-5 (A) NONE SEEN   Hyaline Casts, UA PRESENT    Amorphous Crystal PRESENT   Glucose, capillary     Status: Abnormal   Collection Time: 05/27/17 10:22 PM  Result Value Ref Range   Glucose-Capillary 542 (HH) 65 - 99 mg/dL  Glucose, capillary     Status: Abnormal   Collection Time: 05/27/17 11:29 PM  Result Value Ref Range   Glucose-Capillary 510 (HH) 65 - 99 mg/dL  Glucose, capillary     Status: Abnormal   Collection Time: 05/28/17 12:56 AM  Result Value  Ref Range   Glucose-Capillary 365 (H) 65 - 99 mg/dL  Basic metabolic panel     Status: Abnormal   Collection Time: 05/28/17  5:01 AM  Result Value Ref Range   Sodium 133 (L) 135 - 145 mmol/L   Potassium 4.2 3.5 - 5.1 mmol/L   Chloride 105 101 - 111 mmol/L   CO2 22 22 - 32 mmol/L   Glucose, Bld 354 (H) 65 - 99 mg/dL   BUN 31 (H) 6 - 20 mg/dL   Creatinine, Ser 1.38 (H) 0.44 - 1.00 mg/dL   Calcium 9.8 8.9 - 10.3 mg/dL   GFR calc non Af Amer 43 (L) >60 mL/min   GFR calc Af Amer 50 (L) >60 mL/min    Comment: (NOTE) The eGFR has been calculated using the CKD EPI equation. This calculation has not been validated in all clinical situations. eGFR's persistently <60 mL/min signify possible Chronic Kidney Disease.    Anion gap 6 5 - 15  CBC     Status: Abnormal   Collection Time: 05/28/17  5:01 AM  Result Value Ref Range   WBC 13.0 (H) 3.6 - 11.0 K/uL   RBC 2.68 (L) 3.80 - 5.20 MIL/uL   Hemoglobin 6.6 (L) 12.0 - 16.0 g/dL    HCT 19.8 (L) 35.0 - 47.0 %   MCV 73.6 (L) 80.0 - 100.0 fL   MCH 24.6 (L) 26.0 - 34.0 pg   MCHC 33.5 32.0 - 36.0 g/dL   RDW 18.8 (H) 11.5 - 14.5 %   Platelets 700 (H) 150 - 440 K/uL  Glucose, capillary     Status: Abnormal   Collection Time: 05/28/17  7:51 AM  Result Value Ref Range   Glucose-Capillary 396 (H) 65 - 99 mg/dL  Glucose, capillary     Status: Abnormal   Collection Time: 05/28/17 10:08 AM  Result Value Ref Range   Glucose-Capillary 330 (H) 65 - 99 mg/dL  Folate     Status: None   Collection Time: 05/28/17 11:12 AM  Result Value Ref Range   Folate 15.9 >5.9 ng/mL  Iron and TIBC     Status: Abnormal   Collection Time: 05/28/17 11:12 AM  Result Value Ref Range   Iron 8 (L) 28 - 170 ug/dL   TIBC 183 (L) 250 - 450 ug/dL   Saturation Ratios 4 (L) 10.4 - 31.8 %   UIBC 175 ug/dL  Ferritin     Status: None   Collection Time: 05/28/17 11:12 AM  Result Value Ref Range   Ferritin 151 11 - 307 ng/mL  Reticulocytes     Status: Abnormal   Collection Time: 05/28/17 11:12 AM  Result Value Ref Range   Retic Ct Pct 1.7 0.4 - 3.1 %   RBC. 2.76 (L) 3.80 - 5.20 MIL/uL   Retic Count, Absolute 46.9 19.0 - 183.0 K/uL  Prepare RBC     Status: None (Preliminary result)   Collection Time: 05/28/17 11:12 AM  Result Value Ref Range   Order Confirmation PENDING   Type and screen [facility]     Status: None (Preliminary result)   Collection Time: 05/28/17 11:12 AM  Result Value Ref Range   ABO/RH(D) A POS    Antibody Screen PENDING    Sample Expiration 05/31/2017   Glucose, capillary     Status: Abnormal   Collection Time: 05/28/17 11:40 AM  Result Value Ref Range   Glucose-Capillary 317 (H) 65 - 99 mg/dL   No components found for:  ESR, C REACTIVE PROTEIN MICRO: Recent Results (from the past 720 hour(s))  Blood Culture (routine x 2)     Status: None (Preliminary result)   Collection Time: 05/27/17  9:00 PM  Result Value Ref Range Status   Specimen  Description BLOOD LAC  Final   Special Requests   Final    BOTTLES DRAWN AEROBIC AND ANAEROBIC Blood Culture results may not be optimal due to an excessive volume of blood received in culture bottles   Culture NO GROWTH < 12 HOURS  Final   Report Status PENDING  Incomplete  Blood Culture (routine x 2)     Status: None (Preliminary result)   Collection Time: 05/27/17  9:00 PM  Result Value Ref Range Status   Specimen Description BLOOD RAC  Final   Special Requests   Final    BOTTLES DRAWN AEROBIC AND ANAEROBIC Blood Culture adequate volume   Culture NO GROWTH < 12 HOURS  Final   Report Status PENDING  Incomplete    IMAGING: Dg Chest Port 1 View  Result Date: 05/27/2017 CLINICAL DATA:  51 year old female with sepsis. EXAM: PORTABLE CHEST 1 VIEW COMPARISON:  02/13/2017 and earlier. FINDINGS: AP view of the chest at 2108 hours. Single lumen right chest tunneled catheter has been removed. Stable cardiomegaly and mediastinal contours. Mildly lower lung volumes. Stable pulmonary vascularity without overt edema. No pneumothorax, pleural effusion or confluent pulmonary opacity identified. No acute osseous abnormality identified. IMPRESSION: Stable cardiomegaly.  No acute cardiopulmonary abnormality. Electronically Signed   By: Genevie Ann M.D.   On: 05/27/2017 21:33   Dg Foot 2 Views Right  Result Date: 05/27/2017 CLINICAL DATA:  51 year old female with right foot plantar ulcer. Sepsis. EXAM: RIGHT FOOT - 2 VIEW COMPARISON:  02/13/2017 and earlier. FINDINGS: Two views. Chronic Neuropathic foot with chronic midfoot bony collapse and disorganization. New osteopenia along the anterior calcaneus and new area of cortical osteolysis since May. This is in proximity to the large gas containing plantar soft tissue wound. Continued severe generalized soft tissue swelling about the right foot. No tracking subcutaneous gas. Previous partial fourth and fifth ray amputation. IMPRESSION: 1. Positive for Osteomyelitis at  the anterior calcaneus. 2. 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 Electronically Signed   By: Genevie Ann M.D.   On: 05/27/2017 21:36    Assessment:   TINISHA ETZKORN is a 51 y.o. female with poorly controlled DM, ongoing tobacco abuse, charcot deformity R foot previously treated at [facility] with dapto, cefepime and flagyl for bil foot osteomyelits at [facility] (Cxs neg) and then again in May to June with IV vanco flagyl and cefepime after bedside I and D - cx with [facility]. She has long hx of noncompliance and it has been explained to her that the foot is not salvageable and should have a BKA but she has refused. I again reiterated this to her. Discussed risk of sepsis and death.   Recommendations Cont vanco Change zosyn to cefepime and flagyl to avoid nephrotoxicity of combo vanco and zosyn  Check esr, crp If does not have BKA I am not sure what the next step is. Will likely suggest prolonged oral abx based on cultures. Discussed needs better DM control and to stop smoking.    Thank you very much for allowing me to participate in the care of this patient. Please call with questions.   Cheral Marker. Ola Spurr, MD

## 2017-05-28 NOTE — Care Management Note (Signed)
Case Management Note  Patient Details  Name: Jackie Miles MRN: [id number] Date of Birth: [birth year]  Subjective/Objective:     Admitted to [facility] with the diagnosis of sepsis . Hx osteomylitis and diabetes. Discharged from this facility 01/2016.  Lives alone. Son is Joette Catching [phone]). Sees Dr. Delilah Shan as primary care physician in [place]. Prescriptions are filled at [facility] in [place].  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 Followed by [facility] and [facility] in the past. No skilled facility. No home oxygen. Rolling walker, bedside commode, and shower chair in the home. Takes care of all basic and instrumental activities of daily living herself. Friends help with errands.                 Action/Plan: Received referral for Home Health needs. No needs identified at this time. Will continue to follow.   Expected Discharge Date:                  Expected Discharge Plan:     In-House Referral:     Discharge planning Services     Post Acute Care Choice:    Choice offered to:     DME Arranged:    DME Agency:     HH Arranged:    HH Agency:     Status of Service:     If discussed at [employer], dates discussed:    Additional Comments:  Gwenette Greet, RNMSN CCM Care Management [phone] 05/28/2017, 9:16 AM

## 2017-05-28 NOTE — Consult Note (Signed)
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 female.  HPI: This is a 51 year old female with chronic uncontrolled diabetes and neuropathy with history of Charcot foot on the right. Is been hospitalized on multiple occasions in the past. At her last hospitalization it was recommended that she undergo below-knee amputation but the patient refused. Has been managed outpatient although she has been noncompliant with her medications and medical follow-up.  Past Medical History:  Diagnosis Date  . Asthma   . Diabetes mellitus without complication ([place])   . Fibromyalgia   . H/O percutaneous left heart catheterization   . HLD (hyperlipidemia)   . Hypertension     Past Surgical History:  Procedure Laterality Date  . toe amputation      Family History  Problem Relation Age of Onset  . Diabetes Mother   . Diabetes Sister     Social History:  reports that she has been smoking.  She has been smoking about 1.00 pack per day. She has never used smokeless tobacco. She reports that she does not drink alcohol or use drugs.  Allergies:  Allergies  Allergen Reactions  . Ibuprofen Other (See Comments)    Reaction:  Acid reflux     Medications:  Scheduled: . amitriptyline  100 mg Oral QHS  . aspirin EC  81 mg Oral Daily  . atorvastatin  40 mg Oral QHS  . enoxaparin (LOVENOX) injection  40 mg Subcutaneous Q24H  . gabapentin  2,400 mg Oral QHS  . gabapentin  800 mg Oral q morning - 10a  . insulin aspart  0-15 Units Subcutaneous TID AC & HS  . insulin glargine  12 Units Subcutaneous Daily  . insulin regular  15 Units Intravenous Once    Results for orders placed or performed during the hospital encounter of 05/27/17 (from the past 48 hour(s))  Glucose, capillary     Status: Abnormal   Collection Time: 05/27/17  8:13 PM  Result Value Ref Range   Glucose-Capillary 560 (HH) 65 - 99 mg/dL   Comment 1 Notify  RN   Blood gas, venous ([facility], [facility], [facility])     Status: None   Collection Time: 05/27/17  8:59 PM  Result Value Ref Range   pH, Ven 7.38 7.250 - 7.430   pCO2, Ven 44 44.0 - 60.0 mmHg   pO2, Ven PENDING 32.0 - 45.0 mmHg   Bicarbonate 26.0 20.0 - 28.0 mmol/L   Acid-Base Excess 0.7 0.0 - 2.0 mmol/L   O2 Saturation PENDING %   Patient temperature 37.0    Collection site VENOUS    Sample type VENOUS    Mechanical Rate PENDING   Comprehensive metabolic panel     Status: Abnormal   Collection Time: 05/27/17  9:00 PM  Result Value Ref Range   Sodium 129 (L) 135 - 145 mmol/L   Potassium 4.8 3.5 - 5.1 mmol/L   Chloride 94 (L) 101 - 111 mmol/L   CO2 23 22 - 32 mmol/L   Glucose, Bld 557 (HH) 65 - 99 mg/dL    Comment: CRITICAL RESULT CALLED TO, READ BACK BY AND VERIFIED WITH STEPHEN JONES AT 2159 ON 05/27/2017 JJB    BUN 36 (H) 6 - 20 mg/dL   Creatinine, Ser 1.75 (H) 0.44 - 1.00 mg/dL   Calcium 11.0 (H) 8.9 - 10.3 mg/dL   Total Protein 9.3 (H) 6.5 - 8.1 g/dL   Albumin 2.5 (L) 3.5 -  5.0 g/dL   AST 16 15 - 41 U/L   ALT 10 (L) 14 - 54 U/L   Alkaline Phosphatase 147 (H) 38 - 126 U/L   Total Bilirubin 0.4 0.3 - 1.2 mg/dL   GFR calc non Af Amer 32 (L) >60 mL/min   GFR calc Af Amer 37 (L) >60 mL/min    Comment: (NOTE) The eGFR has been calculated using the CKD EPI equation. This calculation has not been validated in all clinical situations. eGFR's persistently <60 mL/min signify possible Chronic Kidney Disease.    Anion gap 12 5 - 15  CBC WITH DIFFERENTIAL     Status: Abnormal   Collection Time: 05/27/17  9:00 PM  Result Value Ref Range   WBC 15.6 (H) 3.6 - 11.0 K/uL   RBC 3.34 (L) 3.80 - 5.20 MIL/uL   Hemoglobin 8.1 (L) 12.0 - 16.0 g/dL   HCT 24.9 (L) 35.0 - 47.0 %   MCV 74.7 (L) 80.0 - 100.0 fL   MCH 24.3 (L) 26.0 - 34.0 pg   MCHC 32.6 32.0 - 36.0 g/dL   RDW 19.4 (H) 11.5 - 14.5 %   Platelets 827 (H) 150 - 440 K/uL   Neutrophils Relative % 69 %   Lymphocytes Relative 19 %   Monocytes  Relative 7 %   Eosinophils Relative 1 %   Basophils Relative 0 %   Band Neutrophils 1 %   Metamyelocytes Relative 2 %   Myelocytes 1 %   Promyelocytes Absolute 0 %   Blasts 0 %   nRBC 0 0 /100 WBC   Other 0 %   Neutro Abs 11.3 (H) 1.4 - 6.5 K/uL   Lymphs Abs 3.0 1.0 - 3.6 K/uL   Monocytes Absolute 1.1 (H) 0.2 - 0.9 K/uL   Eosinophils Absolute 0.2 0 - 0.7 K/uL   Basophils Absolute 0.0 0 - 0.1 K/uL   Smear Review MORPHOLOGY UNREMARKABLE   Blood Culture (routine x 2)     Status: None (Preliminary result)   Collection Time: 05/27/17  9:00 PM  Result Value Ref Range   Specimen Description BLOOD LAC    Special Requests      BOTTLES DRAWN AEROBIC AND ANAEROBIC Blood Culture results may not be optimal due to an excessive volume of blood received in culture bottles   Culture NO GROWTH < 12 HOURS    Report Status PENDING   Blood Culture (routine x 2)     Status: None (Preliminary result)   Collection Time: 05/27/17  9:00 PM  Result Value Ref Range   Specimen Description BLOOD RAC    Special Requests      BOTTLES DRAWN AEROBIC AND ANAEROBIC Blood Culture adequate volume   Culture NO GROWTH < 12 HOURS    Report Status PENDING   Lactic acid, plasma     Status: None   Collection Time: 05/27/17  9:00 PM  Result Value Ref Range   Lactic Acid, Venous 1.4 0.5 - 1.9 mmol/L  Protime-INR     Status: Abnormal   Collection Time: 05/27/17  9:00 PM  Result Value Ref Range   Prothrombin Time 15.3 (H) 11.4 - 15.2 seconds   INR 1.22   Urinalysis, Routine w reflex microscopic     Status: Abnormal   Collection Time: 05/27/17  9:02 PM  Result Value Ref Range   Color, Urine YELLOW (A) YELLOW   APPearance CLOUDY (A) CLEAR   Specific Gravity, Urine 1.018 1.005 - 1.030   pH 5.0 5.0 -  8.0   Glucose, UA >=500 (A) NEGATIVE mg/dL   Hgb urine dipstick NEGATIVE NEGATIVE   Bilirubin Urine NEGATIVE NEGATIVE   Ketones, ur NEGATIVE NEGATIVE mg/dL   Protein, ur 30 (A) NEGATIVE mg/dL   Nitrite NEGATIVE  NEGATIVE   Leukocytes, UA NEGATIVE NEGATIVE   RBC / HPF 0-5 0 - 5 RBC/hpf   WBC, UA 0-5 0 - 5 WBC/hpf   Bacteria, UA NONE SEEN NONE SEEN   Squamous Epithelial / LPF 0-5 (A) NONE SEEN   Hyaline Casts, UA PRESENT    Amorphous Crystal PRESENT   Glucose, capillary     Status: Abnormal   Collection Time: 05/27/17 10:22 PM  Result Value Ref Range   Glucose-Capillary 542 (HH) 65 - 99 mg/dL  Glucose, capillary     Status: Abnormal   Collection Time: 05/27/17 11:29 PM  Result Value Ref Range   Glucose-Capillary 510 (HH) 65 - 99 mg/dL  Glucose, capillary     Status: Abnormal   Collection Time: 05/28/17 12:56 AM  Result Value Ref Range   Glucose-Capillary 365 (H) 65 - 99 mg/dL  Basic metabolic panel     Status: Abnormal   Collection Time: 05/28/17  5:01 AM  Result Value Ref Range   Sodium 133 (L) 135 - 145 mmol/L   Potassium 4.2 3.5 - 5.1 mmol/L   Chloride 105 101 - 111 mmol/L   CO2 22 22 - 32 mmol/L   Glucose, Bld 354 (H) 65 - 99 mg/dL   BUN 31 (H) 6 - 20 mg/dL   Creatinine, Ser 1.38 (H) 0.44 - 1.00 mg/dL   Calcium 9.8 8.9 - 10.3 mg/dL   GFR calc non Af Amer 43 (L) >60 mL/min   GFR calc Af Amer 50 (L) >60 mL/min    Comment: (NOTE) The eGFR has been calculated using the CKD EPI equation. This calculation has not been validated in all clinical situations. eGFR's persistently <60 mL/min signify possible Chronic Kidney Disease.    Anion gap 6 5 - 15  CBC     Status: Abnormal   Collection Time: 05/28/17  5:01 AM  Result Value Ref Range   WBC 13.0 (H) 3.6 - 11.0 K/uL   RBC 2.68 (L) 3.80 - 5.20 MIL/uL   Hemoglobin 6.6 (L) 12.0 - 16.0 g/dL   HCT 19.8 (L) 35.0 - 47.0 %   MCV 73.6 (L) 80.0 - 100.0 fL   MCH 24.6 (L) 26.0 - 34.0 pg   MCHC 33.5 32.0 - 36.0 g/dL   RDW 18.8 (H) 11.5 - 14.5 %   Platelets 700 (H) 150 - 440 K/uL  Glucose, capillary     Status: Abnormal   Collection Time: 05/28/17  7:51 AM  Result Value Ref Range   Glucose-Capillary 396 (H) 65 - 99 mg/dL  Glucose,  capillary     Status: Abnormal   Collection Time: 05/28/17 10:08 AM  Result Value Ref Range   Glucose-Capillary 330 (H) 65 - 99 mg/dL  Folate     Status: None   Collection Time: 05/28/17 11:12 AM  Result Value Ref Range   Folate 15.9 >5.9 ng/mL  Iron and TIBC     Status: Abnormal   Collection Time: 05/28/17 11:12 AM  Result Value Ref Range   Iron 8 (L) 28 - 170 ug/dL   TIBC 183 (L) 250 - 450 ug/dL   Saturation Ratios 4 (L) 10.4 - 31.8 %   UIBC 175 ug/dL  Ferritin     Status: None  Collection Time: 05/28/17 11:12 AM  Result Value Ref Range   Ferritin 151 11 - 307 ng/mL  Reticulocytes     Status: Abnormal   Collection Time: 05/28/17 11:12 AM  Result Value Ref Range   Retic Ct Pct 1.7 0.4 - 3.1 %   RBC. 2.76 (L) 3.80 - 5.20 MIL/uL   Retic Count, Absolute 46.9 19.0 - 183.0 K/uL  Prepare RBC     Status: None (Preliminary result)   Collection Time: 05/28/17 11:12 AM  Result Value Ref Range   Order Confirmation PENDING   Type and screen [facility]     Status: None (Preliminary result)   Collection Time: 05/28/17 11:12 AM  Result Value Ref Range   ABO/RH(D) PENDING    Antibody Screen PENDING    Sample Expiration 05/31/2017   Glucose, capillary     Status: Abnormal   Collection Time: 05/28/17 11:40 AM  Result Value Ref Range   Glucose-Capillary 317 (H) 65 - 99 mg/dL    Dg Chest Port 1 View  Result Date: 05/27/2017 CLINICAL DATA:  51 year old female with sepsis. EXAM: PORTABLE CHEST 1 VIEW COMPARISON:  02/13/2017 and earlier. FINDINGS: AP view of the chest at 2108 hours. Single lumen right chest tunneled catheter has been removed. Stable cardiomegaly and mediastinal contours. Mildly lower lung volumes. Stable pulmonary vascularity without overt edema. No pneumothorax, pleural effusion or confluent pulmonary opacity identified. No acute osseous abnormality identified. IMPRESSION: Stable cardiomegaly.  No acute cardiopulmonary abnormality. Electronically Signed    By: Genevie Ann M.D.   On: 05/27/2017 21:33   Dg Foot 2 Views Right  Result Date: 05/27/2017 CLINICAL DATA:  51 year old female with right foot plantar ulcer. Sepsis. EXAM: RIGHT FOOT - 2 VIEW COMPARISON:  02/13/2017 and earlier. FINDINGS: Two views. Chronic Neuropathic foot with chronic midfoot bony collapse and disorganization. New osteopenia along the anterior calcaneus and new area of cortical osteolysis since May. This is in proximity to the large gas containing plantar soft tissue wound. Continued severe generalized soft tissue swelling about the right foot. No tracking subcutaneous gas. Previous partial fourth and fifth ray amputation. IMPRESSION: 1. Positive for Osteomyelitis at the anterior calcaneus. 2. 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 Electronically Signed   By: Genevie Ann M.D.   On: 05/27/2017 21:36    Review of Systems  Constitutional: Positive for malaise/fatigue. Negative for chills and fever.  HENT: Negative.   Eyes: Negative.   Respiratory: Negative.   Cardiovascular: Negative.   Gastrointestinal: Negative for nausea and vomiting.  Genitourinary: Negative.   Musculoskeletal:       Chronic Charcot deformity in the right foot  Skin:       Some swelling with a chronic draining wound on her right foot.  Neurological:       Significant numbness in the feet related to her diabetes  Endo/Heme/Allergies: Negative.   Psychiatric/Behavioral: Negative.    Blood pressure 111/63, pulse (!) 108, temperature 98.9 F (37.2 C), temperature source Oral, resp. rate 18, height 5' 8"  (1.727 m), weight 89 kg (196 lb 1.6 oz), SpO2 97 %. Physical Exam  Cardiovascular:  DP and PT pulses are palpable.  Musculoskeletal:  Chronic Charcot deformity changes in the right midfoot. Previous amputation of the right fourth and fifth toes as well as the left hallux.  Neurological:  Loss of protective threshold and proprioception in the feet bilateral.  Skin:  Chronic edema in the  right foot with a full-thickness ulceration and pus draining  on the plantar aspect of the midfoot. Wound does probe down to the level of exposed bone.    Assessment/Plan: Assessment: 1. 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 2. Diabetes with associated neuropathy.  Plan: The wound was dressed with sterile gauze and Kerlix. Discussed with the patient and family members present with her today that still her only option is below knee amputation of the right lower extremity. Discussed failure to consider this could possibly lead to loss of life from sepsis. Patient will discuss this with her family. If she elects for amputation would recommend consult to vascular surgery for below-knee amputation.  Jackie Miles 05/28/2017, 12:40 PM

## 2017-05-29 LAB — CBC
HEMATOCRIT: 24.1 % — AB (ref 35.0–47.0)
HEMOGLOBIN: 8.9 g/dL — AB (ref 12.0–16.0)
MCH: 28.2 pg (ref 26.0–34.0)
MCHC: 37 g/dL — ABNORMAL HIGH (ref 32.0–36.0)
MCV: 76.1 fL — AB (ref 80.0–100.0)
PLATELETS: 659 10*3/uL — AB (ref 150–440)
RBC: 3.16 MIL/uL — AB (ref 3.80–5.20)
RDW: 18.8 % — ABNORMAL HIGH (ref 11.5–14.5)
WBC: 11 10*3/uL (ref 3.6–11.0)

## 2017-05-29 LAB — HIV ANTIBODY (ROUTINE TESTING W REFLEX): HIV SCREEN 4TH GENERATION: NONREACTIVE

## 2017-05-29 LAB — BASIC METABOLIC PANEL
ANION GAP: 6 (ref 5–15)
BUN: 22 mg/dL — ABNORMAL HIGH (ref 6–20)
CO2: 23 mmol/L (ref 22–32)
Calcium: 9.9 mg/dL (ref 8.9–10.3)
Chloride: 105 mmol/L (ref 101–111)
Creatinine, Ser: 1.19 mg/dL — ABNORMAL HIGH (ref 0.44–1.00)
GFR calc Af Amer: 59 mL/min — ABNORMAL LOW (ref >60)
GFR, EST NON AFRICAN AMERICAN: 51 mL/min — AB (ref >60)
GLUCOSE: 245 mg/dL — AB (ref 65–99)
POTASSIUM: 3.9 mmol/L (ref 3.5–5.1)
Sodium: 134 mmol/L — ABNORMAL LOW (ref 135–145)

## 2017-05-29 LAB — HEMOGLOBIN A1C

## 2017-05-29 LAB — GLUCOSE, CAPILLARY: GLUCOSE-CAPILLARY: 252 mg/dL — AB (ref 65–99)

## 2017-05-29 MED ORDER — AMOXICILLIN-POT CLAVULANATE 875-125 MG PO TABS
1.0000 | ORAL_TABLET | Freq: Two times a day (BID) | ORAL | 0 refills | Status: DC
Start: 2017-05-29 — End: 2017-09-07

## 2017-05-29 MED ORDER — INSULIN GLARGINE 100 UNIT/ML ~~LOC~~ SOLN: 22.0000 [IU] | Freq: Every day | SUBCUTANEOUS | 11 refills | Status: DC

## 2017-05-29 NOTE — Progress Notes (Signed)
ID E Note She is defervescing, more alert per report. WBC down to 11.  I cannot find from review of records what abx she was most recently on.  Abx are not the solution to this chronic osteomyelitis and draining wound in a patient with poorly controlled DM who continues to smoke and to walk on the foot despite instructions not to.   At this point I would rec prolonged oral abx suppressive therapy.   Can dc when stable on oral augmentin 875 mg bid for a 28 day course.   I can review her culture results and adjust as needed. [facility].  She has refused to see me in my clinic locally in the past.

## 2017-05-29 NOTE — Discharge Summary (Signed)
[facility] [place] [facility]   PATIENT NAME: Jackie Miles    MR#:  [id number]  DATE OF BIRTH:  1966-04-17  DATE OF ADMISSION:  05/27/2017   ADMITTING PHYSICIAN: Oralia Manis, MD  DATE OF DISCHARGE: 05/29/2017  PRIMARY CARE PHYSICIAN: [facility]   ADMISSION DIAGNOSIS:   Hyperglycemia [R73.9] Sepsis, due to unspecified organism (HCC) [A41.9] Osteomyelitis of right foot, unspecified type (HCC) [M86.9]  DISCHARGE DIAGNOSIS:   Principal Problem:   Sepsis (HCC) Active Problems:   Osteomyelitis (HCC)   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 layer exposed (HCC)   HTN (hypertension)   Asthma   HLD (hyperlipidemia)   AKI (acute kidney injury) (HCC)   SECONDARY DIAGNOSIS:   Past Medical History:  Diagnosis Date  . Asthma   . Diabetes mellitus without complication (HCC)   . Fibromyalgia   . H/O percutaneous left heart catheterization   . HLD (hyperlipidemia)   . Hypertension     HOSPITAL COURSE:   51 year old female with past medical history significant for type 2 diabetes mellitus, chronic osteomyelitis of the right foot, ongoing smoking presents to hospital secondary to worsening purulent discharge and swelling of the right foot.  #1 right foot osteomyelitis-chronic infection, patient has declined amputation in the past. -was here in May 2018 and was advised to have below knee amputation, however patient refused and was discharged on IV antibiotics with vancomycin, Flagyl and cefepime for 6 weeks, - however was not following up with ID and only intermittently taking the antibiotics. Also not following nonweightbearing restrictions. -she is currently following up with ID at [facility], and on suppressive antibiotics. -She feels back at her baseline, again refusing concretely about the below knee amputation recommended by our podiatry today. Complications explained including sepsis and death. Family is aware as well as, they were in the room  during the conversation. Once cultures from admission growing gram-negative rods, gram-positive rods and cocci. -Patient feels great and at baseline And would like to be discharged. Since WBC is improving and her fevers are better, discussed with infectious disease consult sent. Since IV antibiotics haven't helped a lot in the past, he recommended placing on Augmentin for 4 weeks and outpatient ID follow-up at [facility]. -Appreciate ID consult and podiatry consult. -X-ray with chronic neuropathy changes to right foot, osteomyelitis of anterior calcaneus. Osteopenia noted as well  #2 diabetes mellitus with hyperglycemia- sugars are better once that Lantus was started. Dose is adjusted to 22 units. Not sure if she was taking the 21 units at home. Continue novolog premeals.  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 Continue gabapentin.   #4 acute on chronic anemia-hemoglobin stays around 8  at baseline. Has had drops in the past but never required transfusion. -Received 2 units packed RBC transfusion for hemoglobin of 6.6. Hemoglobin is at 8.9 prior to discharge  #5 acute renal failure-secondary to ATN from her underlying sepsis.  -with fluids, creatinine is much improved and at 1.1 at discharge   Family pushing patient in a wheelchair this morning. She feels fine and wants to be discharged.  DISCHARGE CONDITIONS:   Guarded  CONSULTS OBTAINED:   Treatment Team:  Linus Galas, DPM Mick Sell, MD  DRUG ALLERGIES:   Allergies  Allergen Reactions  . Ibuprofen Other (See Comments)    Reaction:  Acid reflux    DISCHARGE MEDICATIONS:   Allergies as of 05/29/2017      Reactions   Ibuprofen Other (See Comments)   Reaction:  Acid reflux       Medication List    TAKE these medications   albuterol 108 (90 Base) MCG/ACT inhaler Commonly known as:  PROVENTIL HFA;VENTOLIN HFA Inhale 2 puffs into the lungs every 6 (six) hours as needed for wheezing or shortness of  breath.   amitriptyline 100 MG tablet Commonly known as:  ELAVIL Take 100 mg by mouth at bedtime.   amoxicillin-clavulanate 875-125 MG tablet Commonly known as:  AUGMENTIN Take 1 tablet by mouth 2 (two) times daily. X 4 weeks   aspirin EC 81 MG tablet Take 81 mg by mouth daily.   atorvastatin 40 MG tablet Commonly known as:  LIPITOR Take 40 mg by mouth at bedtime.   cetirizine 10 MG tablet Commonly known as:  ZYRTEC Take 10 mg by mouth at bedtime.   fluticasone 50 MCG/ACT nasal spray Commonly known as:  FLONASE Place 2 sprays into both nostrils daily as needed for rhinitis.   gabapentin 800 MG tablet Commonly known as:  NEURONTIN Take 800-2,400 mg by mouth 2 (two) times daily. Pt takes one tablet in the morning and three tablets at bedtime.   insulin glargine 100 UNIT/ML injection Commonly known as:  LANTUS Inject 0.22 mLs (22 Units total) into the skin daily.   metFORMIN 1000 MG tablet Commonly known as:  GLUCOPHAGE Take 1,000 mg by mouth 2 (two) times daily with a meal.   NOVOLOG FLEXPEN 100 UNIT/ML FlexPen Generic drug:  insulin aspart Inject 7 Units into the skin 3 (three) times daily with meals.   vitamin B-12 1000 MCG tablet Commonly known as:  CYANOCOBALAMIN Take 1,000 mcg by mouth daily.            Discharge Care Instructions        Start     Ordered   05/30/17 0000  insulin glargine (LANTUS) 100 UNIT/ML injection  Daily     05/29/17 0926   05/29/17 0000  amoxicillin-clavulanate (AUGMENTIN) 875-125 MG tablet  2 times daily     05/29/17 0926   05/29/17 0000  Diet - low sodium heart healthy     05/29/17 0926       DISCHARGE INSTRUCTIONS:   1. [facility] ID follow up in 1 week 2. Non weight bearing to the right leg 3. PCP f/u in 1-2 weeks  DIET:   Cardiac diet and Diabetic diet  ACTIVITY:   Activity as tolerated  OXYGEN:   Home Oxygen: No.  Oxygen Delivery: room air  DISCHARGE LOCATION:   home   If you experience worsening of your  admission symptoms, develop shortness of breath, life threatening emergency, suicidal or homicidal thoughts you must seek medical attention immediately by calling 911 or calling your MD immediately  if symptoms less severe.  You Must read complete instructions/literature along with all the possible adverse reactions/side effects for all the Medicines you take and that have been prescribed to you. Take any new Medicines after you have completely understood and accpet all the possible adverse reactions/side effects.   Please note  You were cared for by a hospitalist during your hospital stay. If you have any questions about your discharge medications or the care you received while you were in the hospital after you are discharged, you can call the unit and asked to speak with the hospitalist on call if the hospitalist that took care of you is not available. Once you are discharged, your primary care physician will handle any further medical issues. Please note that NO REFILLS  for any discharge medications will be authorized once you are discharged, as it is imperative that you return to your primary care physician (or establish a relationship with a primary care physician if you do not have one) for your aftercare needs so that they can reassess your need for medications and monitor your lab values.    On the day of Discharge:  VITAL SIGNS:   Blood pressure (!) 100/52, pulse 91, temperature 98.8 F (37.1 C), temperature source Oral, resp. rate 18, height 5\' 8"  (1.727 m), weight 89 kg (196 lb 1.6 oz), SpO2 98 %.  PHYSICAL EXAMINATION:    GENERAL:  51 y.o.-year-old patient lying in the bed with no acute distress.  EYES: Pupils equal, round, reactive to light and accommodation. No scleral icterus. Extraocular muscles intact.  HEENT: Head atraumatic, normocephalic. Oropharynx and nasopharynx clear.  NECK:  Supple, no jugular venous distention. No thyroid enlargement, no tenderness.  LUNGS: Normal  breath sounds bilaterally, no wheezing, rales,rhonchi or crepitation. No use of accessory muscles of respiration. Decreased bibasilar breath sounds CARDIOVASCULAR: S1, S2 normal. No  rubs, or gallops. 2/6 systolic murmur is present ABDOMEN: Soft, nontender, nondistended. Bowel sounds present. No organomegaly or mass.  EXTREMITIES: charcot foot chronic,, flat foot, right foot with plantar surface deep wound, exposing underling muscles, purulent drainage and foul odor present. 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 Muscle strength 5/5 in all extremities. Sensation intact. Gait not checked. Global weakness present Not supposed to bear weight on the right foot PSYCHIATRIC: The patient is alert and oriented x 3.  SKIN: No obvious rash, lesion, or ulcer.  DATA REVIEW:   CBC  Recent Labs Lab 05/29/17 0729  WBC 11.0  HGB 8.9*  HCT 24.1*  PLT 659*    Chemistries   Recent Labs Lab 05/27/17 2100  05/29/17 0729  NA 129*  < > 134*  K 4.8  < > 3.9  CL 94*  < > 105  CO2 23  < > 23  GLUCOSE 557*  < > 245*  BUN 36*  < > 22*  CREATININE 1.75*  < > 1.19*  CALCIUM 11.0*  < > 9.9  AST 16  --   --   ALT 10*  --   --   ALKPHOS 147*  --   --   BILITOT 0.4  --   --   < > = values in this interval not displayed.   Microbiology Results  Results for orders placed or performed during the hospital encounter of 05/27/17  Blood Culture (routine x 2)     Status: None (Preliminary result)   Collection Time: 05/27/17  9:00 PM  Result Value Ref Range Status   Specimen Description BLOOD LAC  Final   Special Requests   Final    BOTTLES DRAWN AEROBIC AND ANAEROBIC Blood Culture results may not be optimal due to an excessive volume of blood received in culture bottles   Culture NO GROWTH 2 DAYS  Final   Report Status PENDING  Incomplete  Blood Culture (routine x 2)     Status: None (Preliminary result)    Collection Time: 05/27/17  9:00 PM  Result Value Ref Range Status   Specimen Description BLOOD RAC  Final   Special Requests   Final    BOTTLES DRAWN AEROBIC AND ANAEROBIC Blood Culture adequate volume   Culture NO GROWTH 2 DAYS  Final  Report Status PENDING  Incomplete  Aerobic Culture (superficial specimen)     Status: None (Preliminary result)   Collection Time: 05/28/17  3:30 PM  Result Value Ref Range Status   Specimen Description FOOT RIGHT  Final   Special Requests RIGHT FOOT  Final   Gram Stain   Final    MODERATE WBC PRESENT, PREDOMINANTLY PMN MODERATE GRAM NEGATIVE RODS FEW GRAM POSITIVE RODS FEW GRAM POSITIVE COCCI Performed at [facility], [address], [place], [place] [address]    Culture PENDING  Incomplete   Report Status PENDING  Incomplete    RADIOLOGY:  No results found.   Management plans discussed with the patient, family and they are in agreement.  CODE STATUS:     Code Status Orders        Start     Ordered   05/28/17 0138  Do not attempt resuscitation (DNR)  Continuous    Question Answer Comment  In the event of cardiac or respiratory ARREST Do not call a "code blue"   In the event of cardiac or respiratory ARREST Do not perform Intubation, CPR, defibrillation or ACLS   In the event of cardiac or respiratory ARREST Use medication by any route, position, wound care, and other measures to relive pain and suffering. May use oxygen, suction and manual treatment of airway obstruction as needed for comfort.      05/28/17 0137    Code Status History    Date Active Date Inactive Code Status Order ID Comments User Context   02/14/2017  4:21 AM 02/15/2017 10:17 PM DNR [id number]  Arnaldo Natal, MD Inpatient   02/22/2016  2:08 PM 02/26/2016  3:22 PM DNR [id number]  Milagros Loll, MD Inpatient   02/21/2016  6:35 PM 02/22/2016  2:08 PM Full Code [id number]  Altamese Dilling, MD Inpatient   01/22/2016  6:13 PM 01/22/2016 11:49 PM Full Code  [id number]  Auburn Bilberry, MD ED   01/12/2016  3:28 AM 01/14/2016  2:43 PM Full Code [id number]  Enedina Finner, MD ED      TOTAL TIME TAKING CARE OF THIS PATIENT: 37 minutes.    Reace Breshears M.D on 05/29/2017 at 9:27 AM  Between 7am to 6pm - Pager - [phone]  After 6pm go to [email or web address] - Social research officer, government  [facility] [place] [facility]  Office  [phone]  CC: Primary care physician; [facility]   Note: This dictation was prepared with Dragon dictation along with smaller phrase technology. Any transcriptional errors that result from this process are unintentional.

## 2017-05-29 NOTE — Care Management (Signed)
RNCM following for home IV antibiotics. Family at patient's bedside. Patient has wheelchair available for use. [facility] if needed. That is being determined. No other Home health needs per MD. Patient has Medicaid and therefore no payer for HHPT under diagnosis.

## 2017-05-31 LAB — TYPE AND SCREEN
ABO/RH(D): A POS
ABO/RH(D): A POS
Antibody Screen: POSITIVE
Antibody Screen: POSITIVE
UNIT DIVISION: 0
UNIT DIVISION: 0
UNIT DIVISION: 0
UNIT DIVISION: 0
Unit division: 0
Unit division: 0
Unit division: 0
Unit division: 0

## 2017-05-31 LAB — BPAM RBC
BLOOD PRODUCT EXPIRATION DATE: 201809152359
BLOOD PRODUCT EXPIRATION DATE: 201809172359
BLOOD PRODUCT EXPIRATION DATE: 201809182359
BLOOD PRODUCT EXPIRATION DATE: 201809202359
BLOOD PRODUCT EXPIRATION DATE: 201809202359
BLOOD PRODUCT EXPIRATION DATE: 201809202359
Blood Product Expiration Date: 201809202359
Blood Product Expiration Date: 201809202359
ISSUE DATE / TIME: 201808291822
ISSUE DATE / TIME: 201808292224
ISSUE DATE / TIME: 201808300008
ISSUE DATE / TIME: 201809011216
ISSUE DATE / TIME: 201809011216
UNIT TYPE AND RH: 6200
UNIT TYPE AND RH: 6200
UNIT TYPE AND RH: 6200
UNIT TYPE AND RH: 6200
Unit Type and Rh: 6200
Unit Type and Rh: 6200
Unit Type and Rh: 6200
Unit Type and Rh: 6200

## 2017-06-01 LAB — CULTURE, BLOOD (ROUTINE X 2)
CULTURE: NO GROWTH
Culture: NO GROWTH
SPECIAL REQUESTS: ADEQUATE

## 2017-06-02 LAB — AEROBIC CULTURE W GRAM STAIN (SUPERFICIAL SPECIMEN)

## 2017-06-02 LAB — AEROBIC CULTURE  (SUPERFICIAL SPECIMEN)

## 2017-06-03 LAB — BLOOD GAS, VENOUS
ACID-BASE EXCESS: 0.7 mmol/L (ref 0.0–2.0)
Bicarbonate: 26 mmol/L (ref 20.0–28.0)
PATIENT TEMPERATURE: 37
pCO2, Ven: 44 mmHg (ref 44.0–60.0)
pH, Ven: 7.38 (ref 7.250–7.430)

## 2017-09-07 ENCOUNTER — Other Ambulatory Visit: Payer: Self-pay

## 2017-09-07 ENCOUNTER — Emergency Department: Payer: Medicaid Other

## 2017-09-07 ENCOUNTER — Inpatient Hospital Stay
Admission: EM | Admit: 2017-09-07 | Discharge: 2017-09-08 | DRG: 872 | Discharge status: Against Medical Advice | Payer: Medicaid Other | Attending: Internal Medicine | Admitting: Internal Medicine

## 2017-09-07 ENCOUNTER — Encounter: Payer: Self-pay | Admitting: Emergency Medicine

## 2017-09-07 DIAGNOSIS — E1122 Type 2 diabetes mellitus with diabetic chronic kidney disease: Secondary | ICD-10-CM | POA: Diagnosis present

## 2017-09-07 DIAGNOSIS — E785 Hyperlipidemia, unspecified: Secondary | ICD-10-CM | POA: Diagnosis present

## 2017-09-07 DIAGNOSIS — M797 Fibromyalgia: Secondary | ICD-10-CM | POA: Diagnosis present

## 2017-09-07 DIAGNOSIS — Z833 Family history of diabetes mellitus: Secondary | ICD-10-CM

## 2017-09-07 DIAGNOSIS — I129 Hypertensive chronic kidney disease with stage 1 through stage 4 chronic kidney disease, or unspecified chronic kidney disease: Secondary | ICD-10-CM | POA: Diagnosis present

## 2017-09-07 DIAGNOSIS — L089 Local infection of the skin and subcutaneous tissue, unspecified: Secondary | ICD-10-CM

## 2017-09-07 DIAGNOSIS — N179 Acute kidney failure, unspecified: Secondary | ICD-10-CM | POA: Diagnosis present

## 2017-09-07 DIAGNOSIS — Z7982 Long term (current) use of aspirin: Secondary | ICD-10-CM | POA: Diagnosis not present

## 2017-09-07 DIAGNOSIS — N189 Chronic kidney disease, unspecified: Secondary | ICD-10-CM | POA: Diagnosis present

## 2017-09-07 DIAGNOSIS — A419 Sepsis, unspecified organism: Principal | ICD-10-CM | POA: Diagnosis present

## 2017-09-07 DIAGNOSIS — Z7951 Long term (current) use of inhaled steroids: Secondary | ICD-10-CM | POA: Diagnosis not present

## 2017-09-07 DIAGNOSIS — J449 Chronic obstructive pulmonary disease, unspecified: Secondary | ICD-10-CM | POA: Diagnosis present

## 2017-09-07 DIAGNOSIS — Z886 Allergy status to analgesic agent status: Secondary | ICD-10-CM | POA: Diagnosis not present

## 2017-09-07 DIAGNOSIS — L0390 Cellulitis, unspecified: Secondary | ICD-10-CM | POA: Diagnosis present

## 2017-09-07 DIAGNOSIS — T148XXA Other injury of unspecified body region, initial encounter: Secondary | ICD-10-CM

## 2017-09-07 DIAGNOSIS — Z79899 Other long term (current) drug therapy: Secondary | ICD-10-CM

## 2017-09-07 DIAGNOSIS — F329 Major depressive disorder, single episode, unspecified: Secondary | ICD-10-CM | POA: Diagnosis present

## 2017-09-07 DIAGNOSIS — E1140 Type 2 diabetes mellitus with diabetic neuropathy, unspecified: Secondary | ICD-10-CM | POA: Diagnosis present

## 2017-09-07 DIAGNOSIS — M86671 Other chronic osteomyelitis, right ankle and foot: Secondary | ICD-10-CM | POA: Diagnosis present

## 2017-09-07 DIAGNOSIS — F17210 Nicotine dependence, cigarettes, uncomplicated: Secondary | ICD-10-CM | POA: Diagnosis present

## 2017-09-07 DIAGNOSIS — E11610 Type 2 diabetes mellitus with diabetic neuropathic arthropathy: Secondary | ICD-10-CM | POA: Diagnosis present

## 2017-09-07 DIAGNOSIS — E1169 Type 2 diabetes mellitus with other specified complication: Secondary | ICD-10-CM | POA: Diagnosis present

## 2017-09-07 DIAGNOSIS — Z794 Long term (current) use of insulin: Secondary | ICD-10-CM

## 2017-09-07 DIAGNOSIS — M869 Osteomyelitis, unspecified: Secondary | ICD-10-CM

## 2017-09-07 DIAGNOSIS — Z9119 Patient's noncompliance with other medical treatment and regimen: Secondary | ICD-10-CM | POA: Diagnosis not present

## 2017-09-07 LAB — CBC WITH DIFFERENTIAL/PLATELET
BASOS ABS: 0.3 10*3/uL — AB (ref 0–0.1)
Basophils Relative: 1 %
EOS ABS: 0 10*3/uL (ref 0–0.7)
Eosinophils Relative: 0 %
HCT: 32.5 % — ABNORMAL LOW (ref 35.0–47.0)
HEMOGLOBIN: 10.7 g/dL — AB (ref 12.0–16.0)
LYMPHS PCT: 12 %
Lymphs Abs: 3 10*3/uL (ref 1.0–3.6)
MCH: 24.7 pg — ABNORMAL LOW (ref 26.0–34.0)
MCHC: 32.7 g/dL (ref 32.0–36.0)
MCV: 75.3 fL — ABNORMAL LOW (ref 80.0–100.0)
MONOS PCT: 10 %
Monocytes Absolute: 2.5 10*3/uL — ABNORMAL HIGH (ref 0.2–0.9)
NEUTROS ABS: 19.1 10*3/uL — AB (ref 1.4–6.5)
NEUTROS PCT: 77 %
Platelets: 421 10*3/uL (ref 150–440)
RBC: 4.32 MIL/uL (ref 3.80–5.20)
RDW: 23.5 % — ABNORMAL HIGH (ref 11.5–14.5)
WBC: 24.9 10*3/uL — AB (ref 3.6–11.0)

## 2017-09-07 LAB — COMPREHENSIVE METABOLIC PANEL
ALK PHOS: 102 U/L (ref 38–126)
ALT: 10 U/L — ABNORMAL LOW (ref 14–54)
ANION GAP: 10 (ref 5–15)
AST: 20 U/L (ref 15–41)
Albumin: 3.2 g/dL — ABNORMAL LOW (ref 3.5–5.0)
BILIRUBIN TOTAL: 0.4 mg/dL (ref 0.3–1.2)
BUN: 23 mg/dL — ABNORMAL HIGH (ref 6–20)
CALCIUM: 9 mg/dL (ref 8.9–10.3)
CO2: 22 mmol/L (ref 22–32)
Chloride: 100 mmol/L — ABNORMAL LOW (ref 101–111)
Creatinine, Ser: 1.5 mg/dL — ABNORMAL HIGH (ref 0.44–1.00)
GFR calc non Af Amer: 39 mL/min — ABNORMAL LOW (ref >60)
GFR, EST AFRICAN AMERICAN: 45 mL/min — AB (ref >60)
Glucose, Bld: 238 mg/dL — ABNORMAL HIGH (ref 65–99)
POTASSIUM: 4.7 mmol/L (ref 3.5–5.1)
Sodium: 132 mmol/L — ABNORMAL LOW (ref 135–145)
TOTAL PROTEIN: 8.2 g/dL — AB (ref 6.5–8.1)

## 2017-09-07 LAB — LACTIC ACID, PLASMA: LACTIC ACID, VENOUS: 1.7 mmol/L (ref 0.5–1.9)

## 2017-09-07 LAB — GLUCOSE, CAPILLARY: GLUCOSE-CAPILLARY: 215 mg/dL — AB (ref 65–99)

## 2017-09-07 MED ORDER — GABAPENTIN 800 MG PO TABS
800.0000 mg | ORAL_TABLET | Freq: Every morning | ORAL | Status: DC
  Administered 2017-09-08: 800 mg via ORAL
  Filled 2017-09-07: qty 1

## 2017-09-07 MED ORDER — ENOXAPARIN SODIUM 40 MG/0.4ML ~~LOC~~ SOLN: 40.0000 mg | SUBCUTANEOUS | Status: DC

## 2017-09-07 MED ORDER — PIPERACILLIN-TAZOBACTAM 3.375 G IVPB 30 MIN
3.3750 g | Freq: Once | INTRAVENOUS | Status: AC
  Administered 2017-09-07: 3.375 g via INTRAVENOUS
  Filled 2017-09-07: qty 50

## 2017-09-07 MED ORDER — INSULIN GLARGINE 100 UNIT/ML ~~LOC~~ SOLN
22.0000 [IU] | Freq: Every day | SUBCUTANEOUS | Status: DC
  Administered 2017-09-08: 22 [IU] via SUBCUTANEOUS
  Filled 2017-09-07 (×2): qty 0.22

## 2017-09-07 MED ORDER — ALBUTEROL SULFATE (2.5 MG/3ML) 0.083% IN NEBU: 2.5000 mg | INHALATION_SOLUTION | Freq: Four times a day (QID) | RESPIRATORY_TRACT | Status: DC | PRN

## 2017-09-07 MED ORDER — GABAPENTIN 800 MG PO TABS: 800.0000 mg | ORAL_TABLET | Freq: Two times a day (BID) | ORAL | Status: DC

## 2017-09-07 MED ORDER — SODIUM CHLORIDE 0.9 % IV SOLN
INTRAVENOUS | Status: DC
  Administered 2017-09-07: via INTRAVENOUS

## 2017-09-07 MED ORDER — VANCOMYCIN HCL IN DEXTROSE 1-5 GM/200ML-% IV SOLN
1000.0000 mg | INTRAVENOUS | Status: DC
  Administered 2017-09-08: 1000 mg via INTRAVENOUS
  Filled 2017-09-07 (×2): qty 200

## 2017-09-07 MED ORDER — VANCOMYCIN HCL IN DEXTROSE 1-5 GM/200ML-% IV SOLN
1000.0000 mg | Freq: Once | INTRAVENOUS | Status: AC
  Administered 2017-09-07: 1000 mg via INTRAVENOUS
  Filled 2017-09-07: qty 200

## 2017-09-07 MED ORDER — PIPERACILLIN-TAZOBACTAM 3.375 G IVPB
3.3750 g | Freq: Three times a day (TID) | INTRAVENOUS | Status: DC
  Administered 2017-09-08 (×2): 3.375 g via INTRAVENOUS
  Filled 2017-09-07 (×2): qty 50

## 2017-09-07 MED ORDER — INSULIN ASPART 100 UNIT/ML ~~LOC~~ SOLN
0.0000 [IU] | Freq: Three times a day (TID) | SUBCUTANEOUS | Status: DC
  Administered 2017-09-08: 3 [IU] via SUBCUTANEOUS
  Filled 2017-09-07: qty 1

## 2017-09-07 MED ORDER — BISACODYL 5 MG PO TBEC
5.0000 mg | DELAYED_RELEASE_TABLET | Freq: Every day | ORAL | Status: DC | PRN
  Filled 2017-09-07: qty 1

## 2017-09-07 MED ORDER — ASPIRIN EC 81 MG PO TBEC
81.0000 mg | DELAYED_RELEASE_TABLET | Freq: Every day | ORAL | Status: DC
  Administered 2017-09-08: 81 mg via ORAL
  Filled 2017-09-07: qty 1

## 2017-09-07 MED ORDER — ACETAMINOPHEN 325 MG PO TABS
650.0000 mg | ORAL_TABLET | Freq: Four times a day (QID) | ORAL | Status: DC | PRN
  Administered 2017-09-08: 650 mg via ORAL
  Filled 2017-09-07: qty 2

## 2017-09-07 MED ORDER — SODIUM CHLORIDE 0.9 % IV BOLUS (SEPSIS)
1000.0000 mL | Freq: Once | INTRAVENOUS | Status: AC
  Administered 2017-09-07: 1000 mL via INTRAVENOUS

## 2017-09-07 MED ORDER — AMITRIPTYLINE HCL 25 MG PO TABS: 100.0000 mg | ORAL_TABLET | Freq: Every day | ORAL | Status: DC

## 2017-09-07 MED ORDER — DOCUSATE SODIUM 100 MG PO CAPS
100.0000 mg | ORAL_CAPSULE | Freq: Two times a day (BID) | ORAL | Status: DC
  Administered 2017-09-08: 100 mg via ORAL
  Filled 2017-09-07: qty 1

## 2017-09-07 MED ORDER — ACETAMINOPHEN 650 MG RE SUPP: 650.0000 mg | Freq: Four times a day (QID) | RECTAL | Status: DC | PRN

## 2017-09-07 MED ORDER — HYDROCODONE-ACETAMINOPHEN 5-325 MG PO TABS
1.0000 | ORAL_TABLET | ORAL | Status: DC | PRN
  Administered 2017-09-08: 1 via ORAL
  Filled 2017-09-07: qty 1

## 2017-09-07 MED ORDER — FLUTICASONE PROPIONATE 50 MCG/ACT NA SUSP
2.0000 | Freq: Every day | NASAL | Status: DC | PRN
Start: 2017-09-07 — End: 2017-09-08
  Filled 2017-09-07: qty 16

## 2017-09-07 MED ORDER — ONDANSETRON HCL 4 MG/2ML IJ SOLN: 4.0000 mg | Freq: Four times a day (QID) | INTRAMUSCULAR | Status: DC | PRN

## 2017-09-07 MED ORDER — GABAPENTIN 600 MG PO TABS: 2400.0000 mg | ORAL_TABLET | Freq: Every day | ORAL | Status: DC

## 2017-09-07 MED ORDER — ONDANSETRON HCL 4 MG PO TABS: 4.0000 mg | ORAL_TABLET | Freq: Four times a day (QID) | ORAL | Status: DC | PRN

## 2017-09-07 MED ORDER — TRAZODONE HCL 50 MG PO TABS: 25.0000 mg | ORAL_TABLET | Freq: Every evening | ORAL | Status: DC | PRN

## 2017-09-07 NOTE — ED Notes (Addendum)
Pt wound dressed, transport to room 155 att

## 2017-09-07 NOTE — Progress Notes (Addendum)
ANTIBIOTIC CONSULT NOTE - INITIAL  Pharmacy Consult for vancomycin and Zosyn Indication: sepsis  Allergies  Allergen Reactions  . Ibuprofen Other (See Comments)    Reaction:  Acid reflux     Patient Measurements: Height: 5\' 8"  (172.7 cm) Weight: 196 lb (88.9 kg) IBW/kg (Calculated) : 63.9 Adjusted Body Weight:   Vital Signs: Temp: 102.7 F (39.3 C) (12/09 1815) Temp Source: Oral (12/09 1815) BP: 108/46 (12/09 1815) Pulse Rate: 122 (12/09 1815) Intake/Output from previous day: No intake/output data recorded. Intake/Output from this shift: Total I/O In: 200 [I.V.:200] Out: -   Labs: No results for input(s): WBC, HGB, PLT, LABCREA, CREATININE in the last 72 hours. CrCl cannot be calculated (Patient's most recent lab result is older than the maximum 21 days allowed.). No results for input(s): VANCOTROUGH, VANCOPEAK, VANCORANDOM, GENTTROUGH, GENTPEAK, GENTRANDOM, TOBRATROUGH, TOBRAPEAK, TOBRARND, AMIKACINPEAK, AMIKACINTROU, AMIKACIN in the last 72 hours.   Microbiology: No results found for this or any previous visit (from the past 720 hour(s)).  Medical History: Past Medical History:  Diagnosis Date  . Asthma   . Diabetes mellitus without complication (HCC)   . Fibromyalgia   . H/O percutaneous left heart catheterization   . HLD (hyperlipidemia)   . Hypertension     Medications:  Infusions:  . piperacillin-tazobactam    . vancomycin     Assessment: 51 yof cc fever, MD poked hole in her foot. Code sepsis called. Pharmacy consulted to dose Zosyn and vancomycin  Goal of Therapy:  Vancomycin trough level 15-20 mcg/ml  Plan:  ED orders placed as "once." Will wait for SCr and CrCl to determine maintenance doses.  09/07/17 19:05 SCr resulted 1.5 mg/dL. Will dose Zosyn and Vancomycin 1. Zosyn 3.375 gm IV Q8H EI 2. Vancomycin 1 gm IV x 1 in ED followed in approximately 6 hours (stacked dosing) by vancomycin 1 gm IV Q18H, predicted trough 15 mcg/mL. Pharmacy will  continue to follow and adjust as needed to maintain trough 15 to 20 mcg/mL.   Vd 57.5 L, Ke 0.029 hr-1, T1/2 23.9 hr  Carola FrostNathan A Shivani Barrantes, Pharm.D., BCPS Clinical Pharmacist 09/07/2017,6:21 PM

## 2017-09-07 NOTE — Progress Notes (Signed)
CODE SEPSIS - PHARMACY COMMUNICATION  **Broad Spectrum Antibiotics should be administered within 1 hour of Sepsis diagnosis**  Time Code Sepsis Called/Page Received: 18:15  Antibiotics Ordered: Vancomycin and Zosyn  Time of 1st antibiotic administration: 18:35  Additional action taken by pharmacy: None  If necessary, Name of Provider/Nurse Contacted: N/A    Carola FrostNathan A Caly Pellum, PharmD, BCPS Clinical Pharmacist  09/07/2017  6:22 PM

## 2017-09-07 NOTE — ED Notes (Signed)
Per Dr. Pershing ProudSchaevitz, hang without regard to patient's urine sample.

## 2017-09-07 NOTE — ED Notes (Signed)
Pt is noted to be resting in bed on her L side, snoring respirations noted at this time. VSS. Will continue to monitor for further patient needs.

## 2017-09-07 NOTE — ED Notes (Signed)
MD notified pt's BP 87/61. VORB for 1L NS bolus.

## 2017-09-07 NOTE — H&P (Signed)
[facility] [place] at [facility]   PATIENT NAME: Jackie Miles    MR#:  [id number]  DATE OF BIRTH:  11/11/1965  DATE OF ADMISSION:  09/07/2017  PRIMARY CARE PHYSICIAN: [facility]   REQUESTING/REFERRING PHYSICIAN: Dr. Pershing Proud  CHIEF COMPLAINT: fever, wound infection   Chief Complaint  Patient presents with  . Fever  . Wound Infection    HISTORY OF PRESENT ILLNESS:  Kambria Grima  is a 51 y.o. female with a known history of essential hypertension, diabetes mellitus type 2, hyperlipidemia, neuropathy, fibromyalgia comes in because of fever, drainage from the bottom of the right foot.  She says that everything started yesterday.  Patient was here recently admitted in August for cystoscopy to right foot osteomyelitis, patient was seen by Dr. Sampson Goon that time.  Patient has history of chronic infection in the right foot, declined amputation in the past, refused below-knee amputation as recommended by podiatry, charged home with Augmentin for 4 weeks with outpatient ID follow-up at [facility], was admitted in August on August 28 and discharged on August 30.  Her by wound care at [facility].  Patient had fever up to 103 Fahrenheit at home, drainage from right foot.  Fever was 102 Fahrenheit here, she also is hypotensive.  But she received only 1 L of normal saline so far.  Patient appears comfortable, denies any pain anywhere.  Marland Kitchen PAST MEDICAL HISTORY:   Past Medical History:  Diagnosis Date  . Asthma   . Diabetes mellitus without complication (HCC)   . Fibromyalgia   . H/O percutaneous left heart catheterization   . HLD (hyperlipidemia)   . Hypertension     PAST SURGICAL HISTOIRY:   Past Surgical History:  Procedure Laterality Date  . toe amputation      SOCIAL HISTORY:   Social History   Tobacco Use  . Smoking status: Current Every Day Smoker    Packs/day: 0.50  . Smokeless tobacco: Never Used  Substance Use Topics  . Alcohol use: No    FAMILY  HISTORY:   Family History  Problem Relation Age of Onset  . Diabetes Mother   . Diabetes Sister     DRUG ALLERGIES:   Allergies  Allergen Reactions  . Ibuprofen Other (See Comments)    Reaction:  Acid reflux     REVIEW OF SYSTEMS:  CONSTITUTIONAL: Fever, fatigue  eYES: No blurred or double vision.  EARS, NOSE, AND THROAT: No tinnitus or ear pain.  RESPIRATORY: No cough, shortness of breath, wheezing or hemoptysis.  CARDIOVASCULAR: No chest pain, orthopnea, edema.  GASTROINTESTINAL: No nausea, vomiting, diarrhea or abdominal pain.  GENITOURINARY: No dysuria, hematuria.  ENDOCRINE: No polyuria, nocturia,  HEMATOLOGY: No anemia, easy bruising or bleeding SKIN: No rash or lesion. MUSCULOSKELETAL: Right foot cellulitis EUROLOGIC: No tingling, numbness, weakness.  PSYCHIATRY: No anxiety or depression.   MEDICATIONS AT HOME:   Prior to Admission medications   Medication Sig Start Date End Date Taking? Authorizing Provider  amitriptyline (ELAVIL) 100 MG tablet Take 100 mg by mouth at bedtime.   Yes [provider]  aspirin EC 81 MG tablet Take 81 mg by mouth daily.   Yes [provider]  gabapentin (NEURONTIN) 800 MG tablet Take 800-2,400 mg by mouth 2 (two) times daily. Pt takes one tablet in the morning and three tablets at bedtime.   Yes [provider]  insulin aspart (NOVOLOG FLEXPEN) 100 UNIT/ML FlexPen Inject 7 Units into the skin every morning.    Yes  [provider]  insulin glargine (LANTUS) 100 UNIT/ML injection Inject 0.22 mLs (22 Units total) into the skin daily. Patient taking differently: Inject 21 Units into the skin daily.  05/30/17  Yes Enid BaasKalisetti, Radhika, MD  metFORMIN (GLUCOPHAGE) 1000 MG tablet Take 1,000 mg by mouth 2 (two) times daily with a meal.   Yes [provider]  albuterol (PROVENTIL HFA;VENTOLIN HFA) 108 (90 Base) MCG/ACT inhaler Inhale 2 puffs into the lungs every 6 (six) hours as needed for wheezing or  shortness of breath.    [provider]  fluticasone (FLONASE) 50 MCG/ACT nasal spray Place 2 sprays into both nostrils daily as needed for rhinitis.     [provider]      VITAL SIGNS:  Blood pressure 96/61, pulse (!) 102, temperature 99.4 F (37.4 C), temperature source Oral, resp. rate 17, height 5\' 8"  (1.727 m), weight 88.9 kg (196 lb), SpO2 97 %.  PHYSICAL EXAMINATION:  GENERAL:  51 y.o.-year-old patient lying in the bed with no acute distress.  Sleeping on to the side. EYES: Pupils equal, round, reactive to light No scleral icterus. Extraocular muscles intact.  HEENT: Head atraumatic, normocephalic. Oropharynx and nasopharynx clear.  NECK:  Supple, no jugular venous distention. No thyroid enlargement, no tenderness.  LUNGS: Normal breath sounds bilaterally, no wheezing, rales,rhonchi or crepitation. No use of accessory muscles of respiration.  CARDIOVASCULAR: S1, S2 normal. No murmurs, rubs, or gallops.  ABDOMEN: Soft, nontender, nondistended. Bowel sounds present. No organomegaly or mass.  EXTREMITIES:  Right lower extremity with signs of venous stasis and chronic change from the right mid calf on downward to the foot which is swollen diffusely.  The right lower extremity is warm when compared to the left lower extremity.  There is a deep wound to the right foot plantar surface that extends from the medial aspect around to the lateral aspect.  There is no tenderness to palpation.  There is an intact dorsalis pedis pulse.  There is no exudate.  Granulation tissue appears pink with only a small amount of surrounding yellowish unhealthy appearing tissue near the wound borders.  Specifically, the wound is about 2 cm in depth and extends to the adipose layer.  I do not see exposed muscle or tendon.  NEUROLOGIC: Cranial nerves II through XII are intact. Muscle strength 5/5 in all extremities. Sensation intact. Gait not checked.  PSYCHIATRIC: The patient is alert and oriented  x 3.  SKIN: No obvious rash, lesion, or ulcer.   LABORATORY PANEL:   CBC Recent Labs  Lab 09/07/17 1814  WBC 24.9*  HGB 10.7*  HCT 32.5*  PLT 421   ------------------------------------------------------------------------------------------------------------------  Chemistries  Recent Labs  Lab 09/07/17 1814  NA 132*  K 4.7  CL 100*  CO2 22  GLUCOSE 238*  BUN 23*  CREATININE 1.50*  CALCIUM 9.0  AST 20  ALT 10*  ALKPHOS 102  BILITOT 0.4   ------------------------------------------------------------------------------------------------------------------  Cardiac Enzymes No results for input(s): TROPONINI in the last 168 hours. ------------------------------------------------------------------------------------------------------------------  RADIOLOGY:  Dg Foot Complete Right  Result Date: 09/07/2017 CLINICAL DATA:  Plantar ulcer. Assess for osteomyelitis. History of RIGHT calcaneal osteomyelitis. EXAM: RIGHT FOOT COMPLETE - 3+ VIEW COMPARISON:  RIGHT foot radiographs May 27, 2017 FINDINGS: Re- demonstration of focal osteopenia and erosions anterior process of the calcaneus and, extension to the midfoot though less conspicuous than prior CT. Further destruction an disorganization of midfoot. Transmetatarsal fourth and fifth amputations. No fracture deformity. Marked soft tissue swelling with similar mid  and hindfoot plantar ulceration and focal subcutaneous gas. 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 Worsening Charcot joint with further midfoot disorganization. Electronically Signed   By: Awilda Metroourtnay  Bloomer M.D.   On: 09/07/2017 18:55    EKG:   Orders placed or performed during the hospital encounter of 09/07/17  . ED EKG 12-Lead  . ED EKG 12-Lead  . EKG 12-Lead  . EKG 12-Lead  . EKG 12-Lead  . EKG 12-Lead   sinus tachycardia with 120 bpm.  IMPRESSION AND PLAN:   51 year old female patient essential hypertension, diabetes mellitus  type 2, neuropathy comes in with high fever, wound infection the right foot.   #1 sepsis secondary to right foot infection with evidence of tachycardia, high fever, acute on chronic renal failure.  Continue aggressive hydration, follow blood cultures, continue IV vancomycin, Zosyn. 2.  Acute on chronic right foot osteomyelitis with Charcot joint, consult podiatry, ID, previously refused below-knee amputation as recommended by podiatry.  Consult podiatry, ID.  has noncompliance.  Cell vascular surgery also. 3.  History of COPD: No wheezing, continue albuterol, Flonase.  4 history of depression: Continue Elavil. 5 diabetes mellitus type 2: Continue Lantus, Victoza, add sliding scale insulin with coverage, hold metformin secondary to renal insufficiency. All the records are reviewed and case discussed with ED provider. Management plans discussed with the patient, family and they are in agreement.  CODE STATUS:full   TOTAL TIME TAKING CARE OF THIS PATIENT55 minutes.    Katha HammingSnehalatha Elija Mccamish M.D on 09/07/2017 at 9:13 PM  Between 7am to 6pm - Pager - [phone]  After 6pm go to [email or web address] - password [email or web address]  Fabio [facility]  Office  [phone]  CC: Primary care physician; [facility]  Note: This dictation was prepared with Dragon dictation along with smaller phrase technology. Any transcriptional errors that result from this process are unintentional.

## 2017-09-07 NOTE — ED Provider Notes (Signed)
[facility] Emergency Department Provider Note  ___________________________________________   First MD Initiated Contact with Patient 09/07/17 1810     (approximate)  I have reviewed the triage vital signs and the nursing notes.   HISTORY  Chief Complaint Fever and Wound Infection   HPI Jackie Miles is a 51 y.o. female of diabetes and fibromyalgia and a chronic right plantar surface foot wound who is presenting to the emergency department today with fever that started this morning.  Says that about every 6 months she becomes febrile from bacterial seeding from her right foot.  Patient denies any increased swelling or pain to the right foot.  Also denying any other symptoms such as nausea, vomiting or diarrhea.  Denies any abdominal pain or burning with urination.  Denies any drainage from the right foot wound.   Past Medical History:  Diagnosis Date  . Asthma   . Diabetes mellitus without complication (HCC)   . Fibromyalgia   . H/O percutaneous left heart catheterization   . HLD (hyperlipidemia)   . Hypertension     Patient Active Problem List   Diagnosis Date Noted  . HTN (hypertension) 05/28/2017  . Asthma 05/28/2017  . HLD (hyperlipidemia) 05/28/2017  . AKI (acute kidney injury) (HCC) 05/28/2017  . 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 Cellulitis of foot, right   . Pressure injury of skin 02/14/2017  . Osteomyelitis (HCC) 02/21/2016  . Foot infection 01/22/2016  . Sepsis (HCC) 01/12/2016    Past Surgical History:  Procedure Laterality Date  . toe amputation      Prior to Admission medications   Medication Sig Start Date End Date Taking? Authorizing Provider  amitriptyline (ELAVIL) 100 MG tablet Take 100 mg by mouth at bedtime.   Yes [provider]  aspirin EC 81 MG tablet Take 81 mg by mouth daily.   Yes [provider]  gabapentin (NEURONTIN) 800 MG tablet Take 800-2,400 mg by mouth 2 (two)  times daily. Pt takes one tablet in the morning and three tablets at bedtime.   Yes [provider]  insulin aspart (NOVOLOG FLEXPEN) 100 UNIT/ML FlexPen Inject 7 Units into the skin every morning.    Yes [provider]  insulin glargine (LANTUS) 100 UNIT/ML injection Inject 0.22 mLs (22 Units total) into the skin daily. Patient taking differently: Inject 21 Units into the skin daily.  05/30/17  Yes Enid Baas, MD  metFORMIN (GLUCOPHAGE) 1000 MG tablet Take 1,000 mg by mouth 2 (two) times daily with a meal.   Yes [provider]  albuterol (PROVENTIL HFA;VENTOLIN HFA) 108 (90 Base) MCG/ACT inhaler Inhale 2 puffs into the lungs every 6 (six) hours as needed for wheezing or shortness of breath.    [provider]  fluticasone (FLONASE) 50 MCG/ACT nasal spray Place 2 sprays into both nostrils daily as needed for rhinitis.     [provider]    Allergies Ibuprofen  Family History  Problem Relation Age of Onset  . Diabetes Mother   . Diabetes Sister     Social History Social History   Tobacco Use  . Smoking status: Current Every Day Smoker    Packs/day: 0.50  . Smokeless tobacco: Never Used  Substance Use Topics  . Alcohol use: No  . Drug use: No    Review of Systems  Constitutional: Fever Eyes: No visual changes. ENT: No sore throat. Cardiovascular: Denies chest pain. Respiratory: Denies shortness of breath.  Gastrointestinal: No abdominal pain.  No nausea, no vomiting.  No diarrhea.  No constipation. Genitourinary: Negative for dysuria. Musculoskeletal: Negative for back pain. Skin: Negative for rash. Neurological: Negative for headaches, focal weakness or numbness.   ____________________________________________   PHYSICAL EXAM:  VITAL SIGNS: ED Triage Vitals [09/07/17 1815]  Enc Vitals Group     BP (!) 108/46     Pulse Rate (!) 122     Resp 20     Temp (!) 102.7 F (39.3 C)     Temp Source Oral     SpO2 96 %       Weight 196 lb (88.9 kg)     Height 5\' 8"  (1.727 m)     Head Circumference      Peak Flow      Pain Score      Pain Loc      Pain Edu?      Excl. in GC?     Constitutional: Alert and oriented. Well appearing and in no acute distress. Eyes: Conjunctivae are normal.  Head: Atraumatic. Nose: No congestion/rhinnorhea. Mouth/Throat: Mucous membranes are moist.  Neck: No stridor.   Cardiovascular: Tachycardic, regular rhythm. Grossly normal heart sounds.   Respiratory: Normal respiratory effort.  No retractions. Lungs CTAB. Gastrointestinal: Soft and nontender. No distention. Musculoskeletal:   Right lower extremity with signs of venous stasis and chronic change from the right mid calf on downward to the foot which is swollen diffusely.  The right lower extremity is warm when compared to the left lower extremity.  There is a deep wound to the right foot plantar surface that extends from the medial aspect around to the lateral aspect.  There is no tenderness to palpation.  There is an intact dorsalis pedis pulse.  There is no exudate.  Granulation tissue appears pink with only a small amount of surrounding yellowish unhealthy appearing tissue near the wound borders.  Specifically, the wound is about 2 cm in depth and extends to the adipose layer.  I do not see exposed muscle or tendon.  Neurologic:  Normal speech and language. No gross focal neurologic deficits are appreciated. Skin:  Skin is warm, dry and intact. No rash noted. Psychiatric: Mood and affect are normal. Speech and behavior are normal.  ____________________________________________   LABS (all labs ordered are listed, but only abnormal results are displayed)  Labs Reviewed  COMPREHENSIVE METABOLIC PANEL - Abnormal; Notable for the following components:      Result Value   Sodium 132 (*)    Chloride 100 (*)    Glucose, Bld 238 (*)    BUN 23 (*)    Creatinine, Ser 1.50 (*)    Total Protein 8.2 (*)    Albumin 3.2 (*)     ALT 10 (*)    GFR calc non Af Amer 39 (*)    GFR calc Af Amer 45 (*)    All other components within normal limits  CBC WITH DIFFERENTIAL/PLATELET - Abnormal; Notable for the following components:   WBC 24.9 (*)    Hemoglobin 10.7 (*)    HCT 32.5 (*)    MCV 75.3 (*)    MCH 24.7 (*)    RDW 23.5 (*)    Neutro Abs 19.1 (*)    Monocytes Absolute 2.5 (*)    Basophils Absolute 0.3 (*)    All other components within normal limits  CULTURE, BLOOD (ROUTINE X 2)  CULTURE, BLOOD (ROUTINE X 2)  LACTIC ACID, PLASMA  URINALYSIS, ROUTINE  W REFLEX MICROSCOPIC  LACTIC ACID, PLASMA   ____________________________________________  EKG  ED ECG REPORT I, Arelia Longestavid M Audrianna Driskill, the attending physician, personally viewed and interpreted this ECG.   Date: 09/07/2017  EKG Time: 1811  Rate: 120  Rhythm: sinus tachycardia  Axis: Normal  Intervals:Prolonged QcT at 525  ST&T Change: No ST segment elevation or depression.  No abnormal T wave inversion.  ____________________________________________  RADIOLOGY  Chronic mild and hindfoot osteomyelitis with plantar ulcer.  Worsening Charcot joint with further midfoot disorganization. ____________________________________________   PROCEDURES  Procedure(s) performed:   Procedures  Critical Care performed:   ____________________________________________   INITIAL IMPRESSION / ASSESSMENT AND PLAN / ED COURSE  Pertinent labs & imaging results that were available during my care of the patient were reviewed by me and considered in my medical decision making (see chart for details).  DDX: Sepsis, osteomyelitis, cellulitis, wound infection As part of my medical decision making, I reviewed the following data within the electronic MEDICAL RECORD NUMBER Old chart reviewed  ----------------------------------------- 7:48 PM on 09/07/2017 -----------------------------------------  Sepsis alert called.  Patient will be admitted to medicine.  IV antibiotics  ordered.  Signed out to Dr. Pablo LawrenceKonadena.  Patient is aware of the diagnosis as well as the treatment plan and is understanding and willing to comply.     ____________________________________________   FINAL CLINICAL IMPRESSION(S) / ED DIAGNOSES  Osteomyelitis.  Cellulitis.  Sepsis.  Wound infection.    NEW MEDICATIONS STARTED DURING THIS VISIT:  This SmartLink is deprecated. Use AVSMEDLIST instead to display the medication list for a patient.   Note:  This document was prepared using Dragon voice recognition software and may include unintentional dictation errors.     Myrna BlazerSchaevitz, Josselyn Harkins Matthew, MD 09/07/17 [phone]

## 2017-09-07 NOTE — ED Triage Notes (Signed)
Pt presents to ED via [facility] from home with c/o fever. Per EMS initial fever was 103, pt given 800mg  ibuprofen for fever as well as 200cc's NS via IV. Pt presents with known wound to her R foot that she is being seen at wound clinic for. Pt states her doctor recently poked a hole in her foot "because he's stupid". Per EMS CBG 308. Pt presents alert and oriented at this time.

## 2017-09-08 LAB — CBC
HCT: 26.4 % — ABNORMAL LOW (ref 35.0–47.0)
Hemoglobin: 8.6 g/dL — ABNORMAL LOW (ref 12.0–16.0)
MCH: 24.6 pg — AB (ref 26.0–34.0)
MCHC: 32.6 g/dL (ref 32.0–36.0)
MCV: 75.4 fL — ABNORMAL LOW (ref 80.0–100.0)
PLATELETS: 322 10*3/uL (ref 150–440)
RBC: 3.51 MIL/uL — AB (ref 3.80–5.20)
RDW: 23.1 % — ABNORMAL HIGH (ref 11.5–14.5)
WBC: 19.2 10*3/uL — ABNORMAL HIGH (ref 3.6–11.0)

## 2017-09-08 LAB — BASIC METABOLIC PANEL
Anion gap: 9 (ref 5–15)
BUN: 28 mg/dL — ABNORMAL HIGH (ref 6–20)
CALCIUM: 8.1 mg/dL — AB (ref 8.9–10.3)
CO2: 20 mmol/L — AB (ref 22–32)
CREATININE: 1.68 mg/dL — AB (ref 0.44–1.00)
Chloride: 103 mmol/L (ref 101–111)
GFR, EST AFRICAN AMERICAN: 39 mL/min — AB (ref >60)
GFR, EST NON AFRICAN AMERICAN: 34 mL/min — AB (ref >60)
Glucose, Bld: 223 mg/dL — ABNORMAL HIGH (ref 65–99)
Potassium: 4.2 mmol/L (ref 3.5–5.1)
SODIUM: 132 mmol/L — AB (ref 135–145)

## 2017-09-08 LAB — GLUCOSE, CAPILLARY
GLUCOSE-CAPILLARY: 222 mg/dL — AB (ref 65–99)
Glucose-Capillary: 251 mg/dL — ABNORMAL HIGH (ref 65–99)

## 2017-09-08 MED ORDER — NICOTINE 21 MG/24HR TD PT24
21.0000 mg | MEDICATED_PATCH | Freq: Every day | TRANSDERMAL | Status: DC
  Administered 2017-09-08: 21 mg via TRANSDERMAL
  Filled 2017-09-08: qty 1

## 2017-09-08 MED ORDER — GABAPENTIN 400 MG PO CAPS: 800.0000 mg | ORAL_CAPSULE | Freq: Every morning | ORAL | Status: DC

## 2017-09-08 NOTE — Care Management (Signed)
Patient left AMA. [facility] her for [facility] health services. She states she wouldn't allow them as "she didn't want to be bothered last time but agrees now". Her contact number is [phone].

## 2017-09-08 NOTE — Progress Notes (Signed)
RN was rounding on pt around 1145. Pt stated that she wanted to go to the cafeteria to get a salad, and pt was educated that she could not leave the unit d/t her IV infusing, telemetry, and had recently received pain medication. Her son and father were visiting, she refused to have them make a salad and stated "what I really want is to smoke, you don't even have to know I'm gone." Pt was provided with a nicotine patch, but stated "it won't do anything. RN stated that she could not go smoke. Pt then stated, "I will sign myself out and check back in the ER in a few hours." Dr. Allena KatzPatel notified and came to speak with pt. Pt did end up leaving AMA-signed paper. PIVs were removed and telemetry d/c. Pt's son was taking her home.   CuldesacHudson, Latricia HeftKorie G

## 2017-09-08 NOTE — Progress Notes (Signed)
Inpatient Diabetes Program Recommendations  AACE/ADA: New Consensus Statement on Inpatient Glycemic Control (2015)  Target Ranges:  Prepandial:   less than 140 mg/dL      Peak postprandial:   less than 180 mg/dL (1-2 hours)      Critically ill patients:  140 - 180 mg/dL   Results for Jackie Miles, Jackie Miles (MRN [id number]) as of 09/08/2017 09:54  Ref. Range 09/07/2017 23:30 09/08/2017 08:08  Glucose-Capillary Latest Ref Range: 65 - 99 mg/dL 409215 (H) 811222 (H)   Results for Jackie Miles, Jackie Miles (MRN [id number]) as of 09/08/2017 09:54  Ref. Range 05/28/2017 05:01  Hemoglobin A1C Latest Ref Range: 4.8 - 5.6 % >15.5 (H)    Admit with: R Foot Infection/ Sepsis  History: DM  Home DM Meds: Lantus 21 units daily       Novolog 7 units QAM       Metformin 1000 mg BID  Current Insulin Orders: Lantus 22 units daily      Novolog Sensitive Correction Scale/ SSI (0-9 units) TID AC       MD- Note Lantus and Novolog both started this AM.    Please consider placing orders for Hemoglobin A1c level.  Last one on file was 15.5% back on 05/28/17.       --Will follow patient during hospitalization--  Ambrose FinlandJeannine Johnston Cyan Moultrie RN, MSN, CDE Diabetes Coordinator Inpatient Glycemic Control Team Team Pager: [phone] (8a-5p)

## 2017-09-12 LAB — CULTURE, BLOOD (ROUTINE X 2)
CULTURE: NO GROWTH
Culture: NO GROWTH
Special Requests: ADEQUATE
Special Requests: ADEQUATE

## 2017-09-28 NOTE — Discharge Summary (Signed)
[facility] -  at [facility]   PATIENT NAME: Jackie MesiCathy Miles    MR#:  [id number]  DATE OF BIRTH:  1965-11-27  DATE OF ADMISSION:  09/07/2017 ADMITTING PHYSICIAN: Katha HammingSnehalatha Konidena, MD  DATE OF DISCHARGE: 09/08/2017  PRIMARY CARE PHYSICIAN: [facility]    ADMISSION DIAGNOSIS:  Wound infection [T14.8XXA, L08.9] Sepsis, due to unspecified organism (HCC) [A41.9] Cellulitis, unspecified cellulitis site [L03.90] Osteomyelitis of right foot, unspecified type (HCC) [M86.9]  DISCHARGE DIAGNOSIS:  Left AMA therefore no discharge diagnosis  SECONDARY DIAGNOSIS:   Past Medical History:  Diagnosis Date  . Asthma   . Diabetes mellitus without complication (HCC)   . Fibromyalgia   . H/O percutaneous left heart catheterization   . HLD (hyperlipidemia)   . Hypertension     HOSPITAL COURSE:   51 year old female patient essential hypertension, diabetes mellitus type 2, neuropathy comes in with high fever, wound infection the right foot.    Patient did not want to stay in the hospital despite discussing with her and family members.  She left AMA.  She was transported home by her son. CONSULTS OBTAINED:    DRUG ALLERGIES:   Allergies  Allergen Reactions  . Ibuprofen Other (See Comments)    Reaction:  Acid reflux     DISCHARGE MEDICATIONS:   NO Discharge medications ordered since patient left AMA  If you experience worsening of your admission symptoms, develop shortness of breath, life threatening emergency, suicidal or homicidal thoughts you must seek medical attention immediately by calling 911 or calling your MD immediately  if symptoms less severe.  You Must read complete instructions/literature along with all the possible adverse reactions/side effects for all the Medicines you take and that have been prescribed to you. Take any new Medicines after you have completely understood and accept all the possible adverse reactions/side  effects.   Please note  You were cared for by a hospitalist during your hospital stay. If you have any questions about your discharge medications or the care you received while you were in the hospital after you are discharged, you can call the unit and asked to speak with the hospitalist on call if the hospitalist that took care of you is not available. Once you are discharged, your primary care physician will handle any further medical issues. Please note that NO REFILLS for any discharge medications will be authorized once you are discharged, as it is imperative that you return to your primary care physician (or establish a relationship with a primary care physician if you do not have one) for your aftercare needs so that they can reassess your need for medications and monitor your lab values.   Management plans discussed with the patient, family and they are in agreement.  CODE STATUS:  Code Status History    Date Active Date Inactive Code Status Order ID Comments User Context   09/07/2017 20:53 09/08/2017 15:26 Full Code [id number]  Katha HammingKonidena, Snehalatha, MD ED   05/28/2017 01:38 05/29/2017 13:47 DNR [id number]  Oralia ManisWillis, David, MD ED   02/14/2017 04:21 02/15/2017 22:17 DNR [id number]  Arnaldo Nataliamond, Michael S, MD Inpatient   02/22/2016 14:08 02/26/2016 15:22 DNR [id number]  Milagros LollSudini, Srikar, MD Inpatient   02/21/2016 18:35 02/22/2016 14:08 Full Code [id number]  Altamese DillingVachhani, Vaibhavkumar, MD Inpatient   01/22/2016 18:13 01/22/2016 23:49 Full Code [id number]  Auburn BilberryPatel, Shreyang, MD ED   01/12/2016 03:28 01/14/2016 14:43 Full Code [id number]  Enedina FinnerPatel, Kassity Woodson, MD ED      TOTAL TIME TAKING CARE OF THIS  PATIENT:15 minutes.    Enedina FinnerSona Adelynn Gipe M.D on 09/28/2017 at 7:46 AM  Between 7am to 6pm - Pager - [phone] After 6pm go to [email or web address] - Social research officer, [email or web address] [email or web address]  [facility]  Office  [phone]  CC: Primary care physician; [facility]

## 2017-11-27 ENCOUNTER — Other Ambulatory Visit: Payer: Self-pay

## 2017-11-27 ENCOUNTER — Ambulatory Visit
Admission: EM | Admit: 2017-11-27 | Discharge: 2017-11-27 | Disposition: A | Discharge status: Home / Self Care | Payer: Medicaid Other | Attending: Family Medicine | Admitting: Family Medicine

## 2017-11-27 ENCOUNTER — Encounter: Payer: Self-pay | Admitting: *Deleted

## 2017-11-27 DIAGNOSIS — M79604 Pain in right leg: Secondary | ICD-10-CM

## 2017-11-27 DIAGNOSIS — M79605 Pain in left leg: Secondary | ICD-10-CM | POA: Diagnosis not present

## 2017-11-27 NOTE — ED Triage Notes (Signed)
PAtient started having bilateral hip, leg, and foot pain 2 days ago. Patient recently treated for a bacterial infection with IV antibiotics.

## 2017-11-27 NOTE — ED Provider Notes (Signed)
[facility]    CSN: [id number] Arrival date & time: 11/27/17  1447     History   Chief Complaint Chief Complaint  Patient presents with  . Leg Pain    HPI ROTUNDA WORDEN is a 52 y.o. female.   HPI  52 year old female 2 diabetic with diabetic peripheral neuropathy presents with bilateral lower extremity pain from her waist inferiorly into her toes bilaterally.  States that her pain level is a 7-8 and will have brief sharp periods of shooting pain along the entire length of the legs raising it to 9 or 10.  She has a history of osteomyelitis of the foot with sepsis.   She has an indwelling PICC line receiving antibiotics at home with home health nursing. States that she has not had any change in her activities but had the sudden onset 2 days ago of the pain that has worsened over time.  Relate that 1 month ago she fell down several concrete stairs on her buttocks on the left.  This however does not account for both legs hurting.  Patient is in a  wheelchair at the present time        Past Medical History:  Diagnosis Date  . Asthma   . Diabetes mellitus without complication (HCC)   . Fibromyalgia   . H/O percutaneous left heart catheterization   . HLD (hyperlipidemia)   . Hypertension     Patient Active Problem List   Diagnosis Date Noted  . Cellulitis 09/07/2017  . HTN (hypertension) 05/28/2017  . Asthma 05/28/2017  . HLD (hyperlipidemia) 05/28/2017  . AKI (acute kidney injury) (HCC) 05/28/2017  . 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 Cellulitis of foot, right   . Pressure injury of skin 02/14/2017  . Osteomyelitis (HCC) 02/21/2016  . Foot infection 01/22/2016  . Sepsis (HCC) 01/12/2016    Past Surgical History:  Procedure Laterality Date  . toe amputation      OB History    No data available       Home Medications    Prior to Admission medications   Medication Sig Start Date End Date Taking? Authorizing Provider    amitriptyline (ELAVIL) 100 MG tablet Take 100 mg by mouth at bedtime.   Yes [provider]  aspirin EC 81 MG tablet Take 81 mg by mouth daily.   Yes [provider]  fluticasone (FLONASE) 50 MCG/ACT nasal spray Place 2 sprays into both nostrils daily as needed for rhinitis.    Yes [provider]  gabapentin (NEURONTIN) 800 MG tablet Take 800-2,400 mg by mouth 2 (two) times daily. Pt takes one tablet in the morning and three tablets at bedtime.   Yes [provider]  insulin aspart (NOVOLOG FLEXPEN) 100 UNIT/ML FlexPen Inject 7 Units into the skin every morning.    Yes [provider]  insulin glargine (LANTUS) 100 UNIT/ML injection Inject 0.22 mLs (22 Units total) into the skin daily. Patient taking differently: Inject 21 Units into the skin daily.  05/30/17  Yes Enid Baas, MD  metFORMIN (GLUCOPHAGE) 1000 MG tablet Take 1,000 mg by mouth 2 (two) times daily with a meal.   Yes [provider]  albuterol (PROVENTIL HFA;VENTOLIN HFA) 108 (90 Base) MCG/ACT inhaler Inhale 2 puffs into the lungs every 6 (six) hours as needed for wheezing or shortness of breath.    [provider]    Family History Family History  Problem Relation Age of  Onset  . Diabetes Mother   . Diabetes Sister     Social History Social History   Tobacco Use  . Smoking status: Current Every Day Smoker    Packs/day: 0.50  . Smokeless tobacco: Never Used  Substance Use Topics  . Alcohol use: No  . Drug use: No     Allergies   Ibuprofen   Review of Systems Review of Systems  Constitutional: Positive for activity change. Negative for chills, fatigue and fever.  Musculoskeletal: Positive for gait problem.  All other systems reviewed and are negative.    Physical Exam Triage Vital Signs ED Triage Vitals  Enc Vitals Group     BP 11/27/17 1507 115/64     Pulse Rate 11/27/17 1507 (!) 107     Resp 11/27/17 1507 16     Temp 11/27/17 1507  98.6 F (37 C)     Temp Source 11/27/17 1507 Oral     SpO2 11/27/17 1507 100 %     Weight 11/27/17 1509 220 lb (99.8 kg)     Height 11/27/17 1509 5\' 8"  (1.727 m)     Head Circumference --      Peak Flow --      Pain Score 11/27/17 1508 10     Pain Loc --      Pain Edu? --      Excl. in GC? --    No data found.  Updated Vital Signs BP 115/64 (BP Location: Left Arm)   Pulse (!) 107   Temp 98.6 F (37 C) (Oral)   Resp 16   Ht 5\' 8"  (1.727 m)   Wt 220 lb (99.8 kg)   SpO2 100%   BMI 33.45 kg/m   Visual Acuity Right Eye Distance:   Left Eye Distance:   Bilateral Distance:    Right Eye Near:   Left Eye Near:    Bilateral Near:     Physical Exam  Constitutional: She is oriented to person, place, and time. She appears well-developed and well-nourished. No distress.  HENT:  Head: Normocephalic.  Eyes: Pupils are equal, round, and reactive to light.  Neck: Normal range of motion.  Musculoskeletal: She exhibits tenderness.  Neurological: She is alert and oriented to person, place, and time.  Skin: Skin is warm and dry. She is not diaphoretic.  Psychiatric: She has a normal mood and affect. Her behavior is normal. Judgment and thought content normal.  Nursing note and vitals reviewed.    UC Treatments / Results  Labs (all labs ordered are listed, but only abnormal results are displayed) Labs Reviewed - No data to display  EKG  EKG Interpretation None       Radiology No results found.  Procedures Procedures (including critical care time)  Medications Ordered in UC Medications - No data to display   Initial Impression / Assessment and Plan / UC Course  I have reviewed the triage vital signs and the nursing notes.  Pertinent labs & imaging results that were available during my care of the patient were reviewed by me and considered in my medical decision making (see chart for details).     I have discussed with the patient that it would be better for her  to be seen at the facility that has been treating her because of her pain has come on suddenly.  She may need further workup that I can not provide here in our facility.  Also I have no  sufficient medications to help her  with her pain other than Toradol.  She was in agreement and was going to be transported to [facility] department by private own vehicle.  Left our facility in stable condition  Final Clinical Impressions(s) / UC Diagnoses   Final diagnoses:  Bilateral leg pain    ED Discharge Orders    None       Controlled Substance Prescriptions [place] Controlled Substance Registry consulted? Not Applicable   Lutricia FeilRoemer, William P, PA-C 11/27/17 1622

## 2017-12-03 ENCOUNTER — Encounter: Payer: Self-pay | Admitting: Emergency Medicine

## 2017-12-03 ENCOUNTER — Other Ambulatory Visit: Payer: Self-pay

## 2017-12-03 ENCOUNTER — Emergency Department
Admission: EM | Admit: 2017-12-03 | Discharge: 2017-12-03 | Disposition: A | Discharge status: Home / Self Care | Payer: Medicaid Other | Attending: Emergency Medicine | Admitting: Emergency Medicine

## 2017-12-03 DIAGNOSIS — G6289 Other specified polyneuropathies: Secondary | ICD-10-CM

## 2017-12-03 DIAGNOSIS — Z7982 Long term (current) use of aspirin: Secondary | ICD-10-CM | POA: Diagnosis not present

## 2017-12-03 DIAGNOSIS — Z79899 Other long term (current) drug therapy: Secondary | ICD-10-CM | POA: Insufficient documentation

## 2017-12-03 DIAGNOSIS — I10 Essential (primary) hypertension: Secondary | ICD-10-CM | POA: Diagnosis not present

## 2017-12-03 DIAGNOSIS — Z794 Long term (current) use of insulin: Secondary | ICD-10-CM | POA: Diagnosis not present

## 2017-12-03 DIAGNOSIS — F17200 Nicotine dependence, unspecified, uncomplicated: Secondary | ICD-10-CM | POA: Diagnosis not present

## 2017-12-03 DIAGNOSIS — E1140 Type 2 diabetes mellitus with diabetic neuropathy, unspecified: Secondary | ICD-10-CM | POA: Diagnosis present

## 2017-12-03 MED ORDER — PREDNISONE 50 MG PO TABS: ORAL_TABLET | ORAL | 0 refills | Status: DC

## 2017-12-03 MED ORDER — IBUPROFEN 600 MG PO TABS
600.0000 mg | ORAL_TABLET | Freq: Once | ORAL | Status: AC
  Administered 2017-12-03: 600 mg via ORAL
  Filled 2017-12-03: qty 1

## 2017-12-03 NOTE — ED Notes (Signed)
FN: pt wants to be seen for her neuropathy.

## 2017-12-03 NOTE — ED Provider Notes (Signed)
[facility] Emergency Department Provider Note  ____________________________________________  Time seen: Approximately 6:43 PM  I have reviewed the triage vital signs and the nursing notes.   HISTORY  Chief Complaint Peripheral Neuropathy    HPI Jackie Miles is a 52 y.o. female presents to the emergency department with worsening neuropathy of the lower extremities.  Patient reports that she has experienced symptoms for the past 25 years.  Patient is seeking short-term relief.  She denies weakness.  No alleviating measures have been attempted.   Past Medical History:  Diagnosis Date  . Asthma   . Diabetes mellitus without complication (HCC)   . Fibromyalgia   . H/O percutaneous left heart catheterization   . HLD (hyperlipidemia)   . Hypertension     Patient Active Problem List   Diagnosis Date Noted  . Cellulitis 09/07/2017  . HTN (hypertension) 05/28/2017  . Asthma 05/28/2017  . HLD (hyperlipidemia) 05/28/2017  . AKI (acute kidney injury) (HCC) 05/28/2017  . 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 Cellulitis of foot, right   . Pressure injury of skin 02/14/2017  . Osteomyelitis (HCC) 02/21/2016  . Foot infection 01/22/2016  . Sepsis (HCC) 01/12/2016    Past Surgical History:  Procedure Laterality Date  . toe amputation      Prior to Admission medications   Medication Sig Start Date End Date Taking? Authorizing Provider  albuterol (PROVENTIL HFA;VENTOLIN HFA) 108 (90 Base) MCG/ACT inhaler Inhale 2 puffs into the lungs every 6 (six) hours as needed for wheezing or shortness of breath.    [provider]  amitriptyline (ELAVIL) 100 MG tablet Take 100 mg by mouth at bedtime.    [provider]  aspirin EC 81 MG tablet Take 81 mg by mouth daily.    [provider]  fluticasone (FLONASE) 50 MCG/ACT nasal spray Place 2 sprays into both nostrils daily as needed for rhinitis.     [provider]  gabapentin (NEURONTIN) 800 MG tablet Take 800-2,400 mg by mouth 2 (two) times daily. Pt takes one tablet in the morning and three tablets at bedtime.    [provider]  insulin aspart (NOVOLOG FLEXPEN) 100 UNIT/ML FlexPen Inject 7 Units into the skin every morning.     [provider]  insulin glargine (LANTUS) 100 UNIT/ML injection Inject 0.22 mLs (22 Units total) into the skin daily. Patient taking differently: Inject 21 Units into the skin daily.  05/30/17   Enid Baas, MD  metFORMIN (GLUCOPHAGE) 1000 MG tablet Take 1,000 mg by mouth 2 (two) times daily with a meal.    [provider]  predniSONE (DELTASONE) 50 MG tablet Take one 50 mg tablet once daily for 5 days. 12/03/17   Orvil Feil, PA-C    Allergies Ibuprofen  Family History  Problem Relation Age of Onset  . Diabetes Mother   . Diabetes Sister     Social History Social History   Tobacco Use  . Smoking status: Current Every Day Smoker    Packs/day: 0.50  . Smokeless tobacco: Never Used  Substance Use Topics  . Alcohol use: No  . Drug use: No     Review of Systems  Constitutional: No fever/chills Eyes: No visual changes. No discharge ENT: No upper respiratory complaints. Cardiovascular: no chest pain. Respiratory: no cough. No SOB. Musculoskeletal: Negative for musculoskeletal pain. Skin: Negative for rash, abrasions, lacerations, ecchymosis. Neurological: Patient has worsening neuropathy.   ____________________________________________  PHYSICAL EXAM:  VITAL SIGNS: ED Triage Vitals  Enc Vitals Group     BP 12/03/17 1814 138/68     Pulse Rate 12/03/17 1814 (!) 102     Resp --      Temp 12/03/17 1814 98.4 F (36.9 C)     Temp Source 12/03/17 1814 Oral     SpO2 12/03/17 1814 100 %     Weight 12/03/17 1812 223 lb (101.2 kg)     Height 12/03/17 1812 5\' 8"  (1.727 m)     Head Circumference --      Peak Flow --      Pain Score 12/03/17 1819 8     Pain  Loc --      Pain Edu? --      Excl. in GC? --      Constitutional: Alert and oriented. Well appearing and in no acute distress. Eyes: Conjunctivae are normal. PERRL. EOMI. Head: Atraumatic.  Cardiovascular: Normal rate, regular rhythm. Normal S1 and S2.  Good peripheral circulation. Respiratory: Normal respiratory effort without tachypnea or retractions. Lungs CTAB. Good air entry to the bases with no decreased or absent breath sounds. Gastrointestinal: Bowel sounds 4 quadrants. Soft and nontender to palpation. No guarding or rigidity. No palpable masses. No distention. No CVA tenderness. Musculoskeletal: Full range of motion to all extremities. No gross deformities appreciated. Neurologic:  Normal speech and language. No gross focal neurologic deficits are appreciated.  Skin:  Skin is warm, dry and intact. No rash noted. Psychiatric: Mood and affect are normal. Speech and behavior are normal. Patient exhibits appropriate insight and judgement.   ____________________________________________   LABS (all labs ordered are listed, but only abnormal results are displayed)  Labs Reviewed - No data to display ____________________________________________  EKG   ____________________________________________  RADIOLOGY   No results found.  ____________________________________________    PROCEDURES  Procedure(s) performed:    Procedures    Medications  ibuprofen (ADVIL,MOTRIN) tablet 600 mg (not administered)     ____________________________________________   INITIAL IMPRESSION / ASSESSMENT AND PLAN / ED COURSE  Pertinent labs & imaging results that were available during my care of the patient were reviewed by me and considered in my medical decision making (see chart for details).  Review of the [place] CSRS was performed in accordance of the [facility] prior to dispensing any controlled drugs.     Assessment and plan Neuropathy Patient presents to the emergency  department with worsening radiculopathy.  Patient was started empirically on prednisone and advised to follow-up with primary care.  All patient questions were answered.     ____________________________________________  FINAL CLINICAL IMPRESSION(S) / ED DIAGNOSES  Final diagnoses:  Other polyneuropathy      NEW MEDICATIONS STARTED DURING THIS VISIT:  ED Discharge Orders        Ordered    predniSONE (DELTASONE) 50 MG tablet     12/03/17 1838          This chart was dictated using voice recognition software/Dragon. Despite best efforts to proofread, errors can occur which can change the meaning. Any change was purely unintentional.    Orvil FeilWoods, Corabelle Spackman M, PA-C 12/03/17 1854    Jeanmarie PlantMcShane, James A, MD 12/03/17 2053

## 2017-12-03 NOTE — ED Triage Notes (Signed)
Pt in via POV with complaints of neuropathy, reports gabapentin not working for the pain at home.  NAD noted at this time.

## 2017-12-03 NOTE — ED Notes (Signed)
Pt verbalized understanding of discharge instructions. NAD at this time. 

## 2018-02-16 ENCOUNTER — Other Ambulatory Visit: Payer: Self-pay

## 2018-02-16 ENCOUNTER — Encounter: Payer: Self-pay | Admitting: Emergency Medicine

## 2018-02-16 ENCOUNTER — Emergency Department
Admission: EM | Admit: 2018-02-16 | Discharge: 2018-02-16 | Disposition: A | Discharge status: Home / Self Care | Payer: Medicaid Other | Attending: Emergency Medicine | Admitting: Emergency Medicine

## 2018-02-16 DIAGNOSIS — F17210 Nicotine dependence, cigarettes, uncomplicated: Secondary | ICD-10-CM | POA: Insufficient documentation

## 2018-02-16 DIAGNOSIS — M792 Neuralgia and neuritis, unspecified: Secondary | ICD-10-CM | POA: Diagnosis not present

## 2018-02-16 DIAGNOSIS — Z7982 Long term (current) use of aspirin: Secondary | ICD-10-CM | POA: Insufficient documentation

## 2018-02-16 DIAGNOSIS — B0223 Postherpetic polyneuropathy: Secondary | ICD-10-CM

## 2018-02-16 DIAGNOSIS — Z794 Long term (current) use of insulin: Secondary | ICD-10-CM | POA: Diagnosis not present

## 2018-02-16 DIAGNOSIS — Z79899 Other long term (current) drug therapy: Secondary | ICD-10-CM | POA: Diagnosis not present

## 2018-02-16 DIAGNOSIS — E119 Type 2 diabetes mellitus without complications: Secondary | ICD-10-CM | POA: Insufficient documentation

## 2018-02-16 DIAGNOSIS — J45909 Unspecified asthma, uncomplicated: Secondary | ICD-10-CM | POA: Insufficient documentation

## 2018-02-16 DIAGNOSIS — B028 Zoster with other complications: Secondary | ICD-10-CM | POA: Diagnosis present

## 2018-02-16 DIAGNOSIS — I10 Essential (primary) hypertension: Secondary | ICD-10-CM | POA: Insufficient documentation

## 2018-02-16 MED ORDER — HYDROCODONE-ACETAMINOPHEN 5-325 MG PO TABS: 1.0000 | ORAL_TABLET | ORAL | 0 refills | Status: DC | PRN

## 2018-02-16 NOTE — ED Triage Notes (Signed)
R chest wall pain. States was diagnosed with shingles 3 weeks ago, took antiviral, pain continues. Noted to have scabbed and some scabs falling off.

## 2018-02-16 NOTE — ED Notes (Signed)
See triage note  Presents with possible shingles  Was dx'd about 2 weeks ago  conts to have pain rash is noted under right breast and around to mid back  But rash is dry  No drainage noted

## 2018-02-16 NOTE — ED Provider Notes (Signed)
[facility] Emergency Department Provider Note  ____________________________________________  Time seen: Approximately 4:30 PM  I have reviewed the triage vital signs and the nursing notes.   HISTORY  Chief Complaint Herpes Zoster    HPI Jackie Miles is a 52 y.o. female who presents the emergency department complaining of continued pain from shingles.  Patient reports that she has pain in a linear distribution correlated with an overlying rash.  Patient was diagnosed 3 weeks ago with shingles and placed on antiviral medication and pain medication.  Patient reports that symptoms are improving from earlier but continue to have sharp, burning pain radiating in rash distribution.  Patient reports that the rash is improving visually as well as "drying up."  Patient denies any other complaints at this time of headache, visual changes, chest pain, shortness of breath, abdominal pain, nausea or vomiting.  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  Patient denies any complaints with ongoing chronic medical problems at this time.  Past Medical History:  Diagnosis Date  . Asthma   . Diabetes mellitus without complication (HCC)   . Fibromyalgia   . H/O percutaneous left heart catheterization   . HLD (hyperlipidemia)   . Hypertension     Patient Active Problem List   Diagnosis Date Noted  . Cellulitis 09/07/2017  . HTN (hypertension) 05/28/2017  . Asthma 05/28/2017  . HLD (hyperlipidemia) 05/28/2017  . AKI (acute kidney injury) (HCC) 05/28/2017  . 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 Cellulitis of foot, right   . Pressure injury of skin 02/14/2017  . Osteomyelitis (HCC) 02/21/2016  . Foot infection 01/22/2016  . Sepsis (HCC) 01/12/2016    Past Surgical History:  Procedure Laterality Date  . toe amputation      Prior to Admission medications    Medication Sig Start Date End Date Taking? Authorizing Provider  albuterol (PROVENTIL HFA;VENTOLIN HFA) 108 (90 Base) MCG/ACT inhaler Inhale 2 puffs into the lungs every 6 (six) hours as needed for wheezing or shortness of breath.    [provider]  amitriptyline (ELAVIL) 100 MG tablet Take 100 mg by mouth at bedtime.    [provider]  aspirin EC 81 MG tablet Take 81 mg by mouth daily.    [provider]  fluticasone (FLONASE) 50 MCG/ACT nasal spray Place 2 sprays into both nostrils daily as needed for rhinitis.     [provider]  gabapentin (NEURONTIN) 800 MG tablet Take 800-2,400 mg by mouth 2 (two) times daily. Pt takes one tablet in the morning and three tablets at bedtime.    [provider]  HYDROcodone-acetaminophen (NORCO/VICODIN) 5-325 MG tablet Take 1 tablet by mouth every 4 (four) hours as needed for moderate pain. 02/16/18   Donis Pinder, Delorise Royals, PA-C  insulin aspart (NOVOLOG FLEXPEN) 100 UNIT/ML FlexPen Inject 7 Units into the skin every morning.     [provider]  insulin glargine (LANTUS) 100 UNIT/ML injection Inject 0.22 mLs (22 Units total) into the skin daily. Patient taking differently: Inject 21 Units into the skin daily.  05/30/17   Enid Baas, MD  metFORMIN (GLUCOPHAGE) 1000 MG tablet Take 1,000 mg by mouth 2 (two) times daily with a meal.    [provider]  predniSONE (DELTASONE) 50 MG tablet Take one 50 mg tablet once daily for 5 days. 12/03/17   Orvil Feil, PA-C    Allergies Ibuprofen  Family History  Problem Relation Age of Onset  . Diabetes Mother   . Diabetes Sister     Social History Social History   Tobacco Use  . Smoking status: Current Every Day Smoker    Packs/day: 0.50  . Smokeless tobacco: Never Used  Substance Use Topics  . Alcohol use: No  . Drug use: No     Review of Systems  Constitutional: No fever/chills Eyes: No visual changes. No discharge ENT: No upper  respiratory complaints. Cardiovascular: no chest pain. Respiratory: no cough. No SOB. Gastrointestinal: No abdominal pain.  No nausea, no vomiting.  Musculoskeletal: Negative for musculoskeletal pain. Skin: Positive for linear rash in dermatomal distribution to the right chest with underlying burning/sharp pain. Neurological: Negative for headaches, focal weakness or numbness. 10-point ROS otherwise negative.  ____________________________________________   PHYSICAL EXAM:  VITAL SIGNS: ED Triage Vitals  Enc Vitals Group     BP 02/16/18 1527 105/61     Pulse Rate 02/16/18 1527 (!) 101     Resp 02/16/18 1527 20     Temp 02/16/18 1527 98.8 F (37.1 C)     Temp Source 02/16/18 1527 Oral     SpO2 02/16/18 1527 99 %     Weight 02/16/18 1528 180 lb (81.6 kg)     Height 02/16/18 1528  (1.727 m)     Head Circumference --      Peak Flow --      Pain Score 02/16/18 1528 10     Pain Loc --      Pain Edu? --      Excl. in GC? --      Constitutional: Alert and oriented. Well appearing and in no acute distress. Eyes: Conjunctivae are normal. PERRL. EOMI. Head: Atraumatic. Neck: No stridor.    Cardiovascular: Normal rate, regular rhythm. Normal S1 and S2.  Good peripheral circulation. Respiratory: Normal respiratory effort without tachypnea or retractions. Lungs CTAB. Good air entry to the bases with no decreased or absent breath sounds. Musculoskeletal: Full range of motion to all extremities. No gross deformities appreciated. Neurologic:  Normal speech and language. No gross focal neurologic deficits are appreciated.  Skin:  Skin is warm, dry and intact. No rash noted.  Linear rash, in dermatomal distribution, noted in the thoracic region stretching from midline around the right side and ending at the anterior midline.  Skin findings consistent with healing vesicles, excoriations are appreciated.  No surrounding erythema or edema consistent with cellulitis.  Area is tender to  palpation and rash distribution. Psychiatric: Mood and affect are normal. Speech and behavior are normal. Patient exhibits appropriate insight and judgement.   ____________________________________________   LABS (all labs ordered are listed, but only abnormal results are displayed)  Labs Reviewed - No data to display ____________________________________________  EKG   ____________________________________________  RADIOLOGY   No results found.  ____________________________________________    PROCEDURES  Procedure(s) performed:    Procedures    Medications - No data to display   ____________________________________________   INITIAL IMPRESSION / ASSESSMENT AND PLAN / ED COURSE  Pertinent labs & imaging results that were available during my care of the patient were reviewed by me and considered in my medical decision making (see chart for details).  Review of the [place] CSRS was performed in accordance of the [facility] prior to dispensing any controlled drugs.     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  Patient with known diagnosis of herpes zoster with improving rash presents with ongoing  pain.  Evaluation reveals no signs of cellulitis.  Patient does have a history of neuralgia from her diabetes.  Pain is consistent with same.  Counseled patient that this may improve as rash continues to improve, however patient may be experiencing postherpetic neuralgia pain.  Patient is already on significant amounts of gabapentin.  As such, she is to follow-up with primary care for increasing or adding additional medications for ongoing neuropathic pain.  Prior to prescribing additional narcotics, patient was queried of the [place] controlled substance database.  Other than 2 prescriptions since the diagnosis of shingles, which patient acknowledges in the exam, patient does not have any other significant controlled substance history for the  past 2 years.. Patient will be discharged home with prescriptions for #20 of Norco. Patient is to follow up with primary care as needed or otherwise directed. Patient is given ED precautions to return to the ED for any worsening or new symptoms.     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 Despite best efforts to proofread, errors can occur which can change the meaning. Any change was purely unintentional.    Racheal Patches, PA-C 02/16/18 1638    Don Perking, [place], MD 02/19/18 [phone]

## 2018-04-13 ENCOUNTER — Emergency Department
Admission: EM | Admit: 2018-04-13 | Discharge: 2018-04-13 | Disposition: A | Discharge status: Home / Self Care | Payer: Medicaid Other | Attending: Student in an Organized Health Care Education/Training Program | Admitting: Student in an Organized Health Care Education/Training Program

## 2018-04-13 ENCOUNTER — Encounter: Payer: Self-pay | Admitting: Emergency Medicine

## 2018-04-13 DIAGNOSIS — E119 Type 2 diabetes mellitus without complications: Secondary | ICD-10-CM | POA: Diagnosis not present

## 2018-04-13 DIAGNOSIS — Z7982 Long term (current) use of aspirin: Secondary | ICD-10-CM | POA: Diagnosis not present

## 2018-04-13 DIAGNOSIS — Z79899 Other long term (current) drug therapy: Secondary | ICD-10-CM | POA: Insufficient documentation

## 2018-04-13 DIAGNOSIS — Z89429 Acquired absence of other toe(s), unspecified side: Secondary | ICD-10-CM | POA: Diagnosis not present

## 2018-04-13 DIAGNOSIS — I10 Essential (primary) hypertension: Secondary | ICD-10-CM | POA: Insufficient documentation

## 2018-04-13 DIAGNOSIS — J45909 Unspecified asthma, uncomplicated: Secondary | ICD-10-CM | POA: Insufficient documentation

## 2018-04-13 DIAGNOSIS — Z794 Long term (current) use of insulin: Secondary | ICD-10-CM | POA: Insufficient documentation

## 2018-04-13 DIAGNOSIS — B029 Zoster without complications: Secondary | ICD-10-CM | POA: Insufficient documentation

## 2018-04-13 DIAGNOSIS — F17210 Nicotine dependence, cigarettes, uncomplicated: Secondary | ICD-10-CM | POA: Diagnosis not present

## 2018-04-13 HISTORY — DX: Zoster without complications: B02.9

## 2018-04-13 MED ORDER — KETOROLAC TROMETHAMINE 10 MG PO TABS: 10.0000 mg | ORAL_TABLET | Freq: Four times a day (QID) | ORAL | 0 refills | Status: DC | PRN

## 2018-04-13 MED ORDER — KETOROLAC TROMETHAMINE 30 MG/ML IJ SOLN
30.0000 mg | Freq: Once | INTRAMUSCULAR | Status: AC
  Administered 2018-04-13: 30 mg via INTRAMUSCULAR
  Filled 2018-04-13: qty 1

## 2018-04-13 NOTE — ED Provider Notes (Signed)
[facility] Emergency Department Provider Note  ____________________________________________  Time seen: Approximately 1:46 PM  I have reviewed the triage vital signs and the nursing notes.   HISTORY  Chief Complaint Herpes Zoster    HPI Jackie Miles is a 52 y.o. female presents emergency department for evaluation of pain to skin under right breast where she was diagnosed with shingles 2 months ago. Pain improves when she is not moving or touching area.  Pain is worse when she touches the area or her clothes tough the area.  Area is no longer blistering.   Pain started to improve and worsened last week.  Patient was  taking gabapentin and amitriptyline for fibromyalgia but is out of medication. She was given prednisone but states that it only helps for a couple hours.  She does not yet have a primary care.  No cough, shortness of breath, CP.   Past Medical History:  Diagnosis Date  . Asthma   . Diabetes mellitus without complication (HCC)   . Fibromyalgia   . H/O percutaneous left heart catheterization   . HLD (hyperlipidemia)   . Hypertension   . Shingles     Patient Active Problem List   Diagnosis Date Noted  . Cellulitis 09/07/2017  . HTN (hypertension) 05/28/2017  . Asthma 05/28/2017  . HLD (hyperlipidemia) 05/28/2017  . AKI (acute kidney injury) (HCC) 05/28/2017  . 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 Cellulitis of foot, right   . Pressure injury of skin 02/14/2017  . Osteomyelitis (HCC) 02/21/2016  . Foot infection 01/22/2016  . Sepsis (HCC) 01/12/2016    Past Surgical History:  Procedure Laterality Date  . toe amputation      Prior to Admission medications   Medication Sig Start Date End Date Taking? Authorizing Provider  albuterol (PROVENTIL HFA;VENTOLIN HFA) 108 (90 Base) MCG/ACT inhaler Inhale 2 puffs into the lungs every 6 (six) hours as needed for wheezing or shortness of breath.    [provider]  amitriptyline (ELAVIL) 100 MG tablet Take 100 mg by mouth at bedtime.    [provider]  aspirin EC 81 MG tablet Take 81 mg by mouth daily.    [provider]  fluticasone (FLONASE) 50 MCG/ACT nasal spray Place 2 sprays into both nostrils daily as needed for rhinitis.     [provider]  gabapentin (NEURONTIN) 800 MG tablet Take 800-2,400 mg by mouth 2 (two) times daily. Pt takes one tablet in the morning and three tablets at bedtime.    [provider]  HYDROcodone-acetaminophen (NORCO/VICODIN) 5-325 MG tablet Take 1 tablet by mouth every 4 (four) hours as needed for moderate pain. 02/16/18   Cuthriell, Delorise Royals, PA-C  insulin aspart (NOVOLOG FLEXPEN) 100 UNIT/ML FlexPen Inject 7 Units into the skin every morning.     [provider]  insulin glargine (LANTUS) 100 UNIT/ML injection Inject 0.22 mLs (22 Units total) into the skin daily. Patient taking differently: Inject 21 Units into the skin daily.  05/30/17   Enid Baas, MD  ketorolac (TORADOL) 10 MG tablet Take 1 tablet (10 mg total) by mouth every 6 (six) hours as needed. 04/13/18   Enid Derry, PA-C  metFORMIN (GLUCOPHAGE) 1000 MG tablet Take 1,000 mg by mouth 2 (two) times daily with a meal.    [provider]  predniSONE (DELTASONE) 50 MG tablet Take one 50 mg tablet once daily for 5 days. 12/03/17   Orvil Feil,  PA-C    Allergies Ibuprofen  Family History  Problem Relation Age of Onset  . Diabetes Mother   . Diabetes Sister     Social History Social History   Tobacco Use  . Smoking status: Current Every Day Smoker    Packs/day: 0.50  . Smokeless tobacco: Never Used  Substance Use Topics  . Alcohol use: No  . Drug use: No     Review of Systems  Constitutional: No fever/chills Cardiovascular: No chest pain. Respiratory: No cough. No SOB. Gastrointestinal: No nausea, no vomiting.  Musculoskeletal: Negative for musculoskeletal pain. Skin: Negative  for abrasions, lacerations, ecchymosis. Positive for rash.   ____________________________________________   PHYSICAL EXAM:  VITAL SIGNS: ED Triage Vitals  Enc Vitals Group     BP 04/13/18 1209 119/65     Pulse Rate 04/13/18 1207 86     Resp 04/13/18 1207 19     Temp 04/13/18 1207 98.2 F (36.8 C)     Temp src --      SpO2 04/13/18 1207 100 %     Weight 04/13/18 1208 180 lb (81.6 kg)     Height 04/13/18 1208 5\' 8"  (1.727 m)     Head Circumference --      Peak Flow --      Pain Score 04/13/18 1208 10     Pain Loc --      Pain Edu? --      Excl. in GC? --      Constitutional: Alert and oriented. Well appearing and in no acute distress. Eyes: Conjunctivae are normal. PERRL. EOMI. Head: Atraumatic. ENT:      Ears:      Nose: No congestion/rhinnorhea.      Mouth/Throat: Mucous membranes are moist.  Neck: No stridor.  Cardiovascular: Normal rate, regular rhythm.  Good peripheral circulation. Respiratory: Normal respiratory effort without tachypnea or retractions. Lungs CTAB. Good air entry to the bases with no decreased or absent breath sounds. Musculoskeletal: Full range of motion to all extremities. No gross deformities appreciated. Neurologic:  Normal speech and language. No gross focal neurologic deficits are appreciated.  Skin:  Skin is warm, dry and intact. Circular areas of hperpigmented skin under right breast over T5/T6 consistent with recent shingles. No vesicles. Tender to palpation to skin.  Psychiatric: Mood and affect are normal. Speech and behavior are normal. Patient exhibits appropriate insight and judgement.   ____________________________________________   LABS (all labs ordered are listed, but only abnormal results are displayed)  Labs Reviewed - No data to display ____________________________________________  EKG   ____________________________________________  RADIOLOGY  No results  found.  ____________________________________________    PROCEDURES  Procedure(s) performed:    Procedures    Medications  ketorolac (TORADOL) 30 MG/ML injection 30 mg (30 mg Intramuscular Given 04/13/18 1403)     ____________________________________________   INITIAL IMPRESSION / ASSESSMENT AND PLAN / ED COURSE  Pertinent labs & imaging results that were available during my care of the patient were reviewed by me and considered in my medical decision making (see chart for details).  Review of the [place] CSRS was performed in accordance of the [facility] prior to dispensing any controlled drugs.     Patient's diagnosis is consistent with Herpes zoster pain. Vital signs and exam are reassuring. Pain significantly improved with Toradol. Patient is very talkative and talking about her week plans and supper plans. Patient will be discharged home with prescriptions for toradol. Patient is to follow up with PCP as directed. Patient is  given ED precautions to return to the ED for any worsening or new symptoms.     ____________________________________________  FINAL CLINICAL IMPRESSION(S) / ED DIAGNOSES  Final diagnoses:  Herpes zoster without complication      NEW MEDICATIONS STARTED DURING THIS VISIT:  ED Discharge Orders        Ordered    ketorolac (TORADOL) 10 MG tablet  Every 6 hours PRN     04/13/18 1425          This chart was dictated using voice recognition software/Dragon. Despite best efforts to proofread, errors can occur which can change the meaning. Any change was purely unintentional.    Enid Derry, PA-C 04/14/18 1030    Willy Eddy, MD 04/15/18 1058

## 2018-04-13 NOTE — ED Triage Notes (Signed)
Patient presents to ED via POV from home with c/o pain from shingles. Patient was dx with shingles 4 months ago. Patient here for pain control.

## 2018-04-13 NOTE — ED Notes (Signed)
See triage note  States she was dx'd with shingles couple of months ago  States she is still having pain

## 2018-04-29 ENCOUNTER — Emergency Department: Payer: Medicaid Other

## 2018-04-29 ENCOUNTER — Emergency Department
Admission: EM | Admit: 2018-04-29 | Discharge: 2018-04-29 | Disposition: A | Discharge status: Home / Self Care | Payer: Medicaid Other | Attending: Emergency Medicine | Admitting: Emergency Medicine

## 2018-04-29 ENCOUNTER — Other Ambulatory Visit: Payer: Self-pay

## 2018-04-29 DIAGNOSIS — Z794 Long term (current) use of insulin: Secondary | ICD-10-CM | POA: Insufficient documentation

## 2018-04-29 DIAGNOSIS — J45909 Unspecified asthma, uncomplicated: Secondary | ICD-10-CM | POA: Insufficient documentation

## 2018-04-29 DIAGNOSIS — E119 Type 2 diabetes mellitus without complications: Secondary | ICD-10-CM | POA: Diagnosis not present

## 2018-04-29 DIAGNOSIS — R42 Dizziness and giddiness: Secondary | ICD-10-CM | POA: Diagnosis present

## 2018-04-29 DIAGNOSIS — D649 Anemia, unspecified: Secondary | ICD-10-CM

## 2018-04-29 DIAGNOSIS — Z7982 Long term (current) use of aspirin: Secondary | ICD-10-CM | POA: Insufficient documentation

## 2018-04-29 DIAGNOSIS — F17210 Nicotine dependence, cigarettes, uncomplicated: Secondary | ICD-10-CM | POA: Diagnosis not present

## 2018-04-29 DIAGNOSIS — I10 Essential (primary) hypertension: Secondary | ICD-10-CM | POA: Diagnosis not present

## 2018-04-29 DIAGNOSIS — Z79899 Other long term (current) drug therapy: Secondary | ICD-10-CM | POA: Diagnosis not present

## 2018-04-29 LAB — CBC
HCT: 25.5 % — ABNORMAL LOW (ref 35.0–47.0)
HEMOGLOBIN: 8.7 g/dL — AB (ref 12.0–16.0)
MCH: 25.2 pg — AB (ref 26.0–34.0)
MCHC: 34 g/dL (ref 32.0–36.0)
MCV: 74.2 fL — AB (ref 80.0–100.0)
Platelets: 521 10*3/uL — ABNORMAL HIGH (ref 150–440)
RBC: 3.44 MIL/uL — AB (ref 3.80–5.20)
RDW: 21.6 % — ABNORMAL HIGH (ref 11.5–14.5)
WBC: 10.9 10*3/uL (ref 3.6–11.0)

## 2018-04-29 LAB — BASIC METABOLIC PANEL
ANION GAP: 10 (ref 5–15)
BUN: 15 mg/dL (ref 6–20)
CHLORIDE: 104 mmol/L (ref 98–111)
CO2: 24 mmol/L (ref 22–32)
Calcium: 8.9 mg/dL (ref 8.9–10.3)
Creatinine, Ser: 1.42 mg/dL — ABNORMAL HIGH (ref 0.44–1.00)
GFR calc non Af Amer: 41 mL/min — ABNORMAL LOW (ref >60)
GFR, EST AFRICAN AMERICAN: 48 mL/min — AB (ref >60)
Glucose, Bld: 174 mg/dL — ABNORMAL HIGH (ref 70–99)
Potassium: 4 mmol/L (ref 3.5–5.1)
SODIUM: 138 mmol/L (ref 135–145)

## 2018-04-29 LAB — TROPONIN I

## 2018-04-29 MED ORDER — SODIUM CHLORIDE 0.9 % IV BOLUS
1000.0000 mL | Freq: Once | INTRAVENOUS | Status: AC
  Administered 2018-04-29: 1000 mL via INTRAVENOUS

## 2018-04-29 NOTE — ED Triage Notes (Signed)
Pt states dizziness, staggering, and falling. Symptoms x 1 week. States she feels off balance. States 4 months ago she had singles, c/o of pain from that at R chest.   Alert, oriented, in wheelchair. No distress noted. Speaking in complete sentences.

## 2018-04-29 NOTE — ED Notes (Signed)
Triage tech attempted butterfly stick x 2 without success. Carollee HerterShannon RN attempted once. No blood collected at this time.

## 2018-04-29 NOTE — ED Notes (Signed)
Right heel wrapped with nonstick and ace bandage per verbal order by Dr. Alphonzo LemmingsMcShane

## 2018-04-29 NOTE — Discharge Instructions (Addendum)
Tinea wrapping her foot, if the pain or swelling gets worse or if there is high fever, or any other new or worrisome symptoms including redness or streaks from the area or it looks any different than it has for the last 3 months and a concerning way please return to the emergency room.  Is vitally follow-up in the next few days at the [facility] or you plan to institute care.  We have also given you a referral to the wound center which I think would be in your best interest.  Please return to the emergency room if you feel worse in any way.  I would recommend taking iron pills for your anemia.  Have any bleeding, return to the emergency department.

## 2018-04-29 NOTE — ED Notes (Signed)
Patient transported to CT 

## 2018-04-29 NOTE — ED Provider Notes (Addendum)
[facility] Emergency Department Provider Note  ____________________________________________   I have reviewed the triage vital signs and the nursing notes. Where available I have reviewed prior notes and, if possible and indicated, outside hospital notes.    HISTORY  Chief Complaint Dizziness    HPI Jackie Miles is a 52 y.o. female with a history of vertigo, peripheral osteomyelitis, DKA, noncompliance, fibromyalgia, history of heart catheterization chronic osteomyelitis and other cellulitic changes in her right foot with diabetic foot ulcer which is chronically present for "3 months".  States she feels lightheaded when she stands up.  Patient spends most of her time in a wheelchair, when she stands up to go the bathroom she feels lightheaded.  She denies any focal numbness or weakness denies any diarrhea, she has no chest pain or shortness of breath, she denies any fever or chills.  She has a sensation of lightheadedness when she stands up.  Her foot on the right is chronically swollen has been for several years, she states that they have done tenosynovitis because of all the surgeries she has had.  There is no change in that.  Is a chronic foot ulcer, on the l medial ankle which she states is unchanged and she changes the dressing herself every day.  Patient unfortunately has a history of noncompliance and has not been to see her primary care doctor she states in "years" but she states her Medicare will be somehow regulated and she will start doing that this week.  Patient does have a primary care doctor she just does not go.  Transportation issues seem to be the problem.  Anyway she has no headache no focal numbness or weakness or neurologic complaints aside for the fact that she feels a little lightheaded when she stands up.  She also has had multiple prescriptions for narcotics for her postherpetic pain in her chest wall which is still bothering her.     Past  Medical History:  Diagnosis Date  . Asthma   . Diabetes mellitus without complication (HCC)   . Fibromyalgia   . H/O percutaneous left heart catheterization   . HLD (hyperlipidemia)   . Hypertension   . Shingles     Patient Active Problem List   Diagnosis Date Noted  . Cellulitis 09/07/2017  . HTN (hypertension) 05/28/2017  . Asthma 05/28/2017  . HLD (hyperlipidemia) 05/28/2017  . AKI (acute kidney injury) (HCC) 05/28/2017  . 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 Cellulitis of foot, right   . Pressure injury of skin 02/14/2017  . Osteomyelitis (HCC) 02/21/2016  . Foot infection 01/22/2016  . Sepsis (HCC) 01/12/2016    Past Surgical History:  Procedure Laterality Date  . toe amputation      Prior to Admission medications   Medication Sig Start Date End Date Taking? Authorizing Provider  albuterol (PROVENTIL HFA;VENTOLIN HFA) 108 (90 Base) MCG/ACT inhaler Inhale 2 puffs into the lungs every 6 (six) hours as needed for wheezing or shortness of breath.    [provider]  amitriptyline (ELAVIL) 100 MG tablet Take 100 mg by mouth at bedtime.    [provider]  aspirin EC 81 MG tablet Take 81 mg by mouth daily.    [provider]  fluticasone (FLONASE) 50 MCG/ACT nasal spray Place 2 sprays into both nostrils daily as needed for rhinitis.     [provider]  gabapentin (NEURONTIN) 800 MG tablet Take 800-2,400 mg by mouth 2 (  two) times daily. Pt takes one tablet in the morning and three tablets at bedtime.    [provider]  HYDROcodone-acetaminophen (NORCO/VICODIN) 5-325 MG tablet Take 1 tablet by mouth every 4 (four) hours as needed for moderate pain. 02/16/18   Cuthriell, Delorise Royals, PA-C  insulin aspart (NOVOLOG FLEXPEN) 100 UNIT/ML FlexPen Inject 7 Units into the skin every morning.     [provider]  insulin glargine (LANTUS) 100 UNIT/ML injection Inject 0.22 mLs (22 Units total) into the skin  daily. Patient taking differently: Inject 21 Units into the skin daily.  05/30/17   Enid Baas, MD  ketorolac (TORADOL) 10 MG tablet Take 1 tablet (10 mg total) by mouth every 6 (six) hours as needed. 04/13/18   Enid Derry, PA-C  metFORMIN (GLUCOPHAGE) 1000 MG tablet Take 1,000 mg by mouth 2 (two) times daily with a meal.    [provider]  predniSONE (DELTASONE) 50 MG tablet Take one 50 mg tablet once daily for 5 days. 12/03/17   Orvil Feil, PA-C    Allergies Ibuprofen  Family History  Problem Relation Age of Onset  . Diabetes Mother   . Diabetes Sister     Social History Social History   Tobacco Use  . Smoking status: Current Every Day Smoker    Packs/day: 0.50  . Smokeless tobacco: Never Used  Substance Use Topics  . Alcohol use: No  . Drug use: No    Review of Systems Constitutional: No fever/chills Eyes: No visual changes. ENT: No sore throat. No stiff neck no neck pain Cardiovascular: Denies chest pain. Respiratory: Denies shortness of breath. Gastrointestinal:   no vomiting.  No diarrhea.  No constipation. Genitourinary: Negative for dysuria. Musculoskeletal: Negative lower extremity swelling Skin: Negative for rash. Neurological: Negative for severe headaches, focal weakness or numbness.   ____________________________________________   PHYSICAL EXAM:  VITAL SIGNS: ED Triage Vitals  Enc Vitals Group     BP 04/29/18 1345 (!) 103/52     Pulse Rate 04/29/18 1345 97     Resp 04/29/18 1345 18     Temp 04/29/18 1345 99.2 F (37.3 C)     Temp Source 04/29/18 1345 Oral     SpO2 04/29/18 1345 99 %     Weight 04/29/18 1343 225 lb (102.1 kg)     Height 04/29/18 1343 5\' 8"  (1.727 m)     Head Circumference --      Peak Flow --      Pain Score 04/29/18 1343 10     Pain Loc --      Pain Edu? --      Excl. in GC? --     Constitutional: Alert and oriented. Well appearing and in no acute distress. Eyes: Conjunctivae are normal Head:  Atraumatic HEENT: No congestion/rhinnorhea. Mucous membranes are moist.  Oropharynx non-erythematous Neck:   Nontender with no meningismus, no masses, no stridor Cardiovascular: Normal rate, regular rhythm. Grossly normal heart sounds.  Good peripheral circulation. Respiratory: Normal respiratory effort.  No retractions. Lungs CTAB. Abdominal: Soft and nontender. No distention. No guarding no rebound Back:  There is no focal tenderness or step off.  there is no midline tenderness there are no lesions noted. there is no CVA tenderness Musculoskeletal: No lower extremity tenderness, no upper extremity tenderness. No joint effusions, no DVT signs strong distal pulses the right foot is chronically swollen in appearance with a foot ulcer noted medially which does not appear to be acutely infected I do not palpate  any crepitus, there is no redness or streaks from the area Neurologic: Cranial nerves II through XII are grossly intact 5 out of 5 strength bilateral upper and lower extremity. Finger to nose within normal limits heel to shin within normal limits, speech is normal with no word finding difficulty or dysarthria, reflexes symmetric, pupils are equally round and reactive to light, there is no pronator drift, sensation is normal, vision is intact to confrontation, gait is deferred, there is no nystagmus, normal neurologic exam Skin:  Skin is warm, dry and intact. No rash noted. Psychiatric: Mood and affect are mildly anxious. Speech and behavior are normal.  ____________________________________________   LABS (all labs ordered are listed, but only abnormal results are displayed)  Labs Reviewed  BASIC METABOLIC PANEL - Abnormal; Notable for the following components:      Result Value   Glucose, Bld 174 (*)    Creatinine, Ser 1.42 (*)    GFR calc non Af Amer 41 (*)    GFR calc Af Amer 48 (*)    All other components within normal limits  CBC - Abnormal; Notable for the following components:    RBC 3.44 (*)    Hemoglobin 8.7 (*)    HCT 25.5 (*)    MCV 74.2 (*)    MCH 25.2 (*)    RDW 21.6 (*)    Platelets 521 (*)    All other components within normal limits  TROPONIN I  URINALYSIS, COMPLETE (UACMP) WITH MICROSCOPIC  URINE DRUG SCREEN, QUALITATIVE ([facility] ONLY)  POC URINE PREG, ED    Pertinent labs  results that were available during my care of the patient were reviewed by me and considered in my medical decision making (see chart for details). ____________________________________________  EKG  I personally interpreted any EKGs ordered by me or triage Sinus rhythm rate 90 bpm no acute ST elevation or depression, normal axis unremarkable EKG ____________________________________________  RADIOLOGY  Pertinent labs & imaging results that were available during my care of the patient were reviewed by me and considered in my medical decision making (see chart for details). If possible, patient and/or family made aware of any abnormal findings.  Dg Chest 2 View  Result Date: 04/29/2018 CLINICAL DATA:  Chest pain EXAM: CHEST - 2 VIEW COMPARISON:  05/27/2017 FINDINGS: The heart size and mediastinal contours are within normal limits. Both lungs are clear. The visualized skeletal structures are unremarkable. IMPRESSION: No active cardiopulmonary disease. Electronically Signed   By: Alcide CleverMark  Lukens M.D.   On: 04/29/2018 14:22   ____________________________________________    PROCEDURES  Procedure(s) performed: None  Procedures  Critical Care performed: None  ____________________________________________   INITIAL IMPRESSION / ASSESSMENT AND PLAN / ED COURSE  Pertinent labs & imaging results that were available during my care of the patient were reviewed by me and considered in my medical decision making (see chart for details).  Patient here with a feeling of lightheadedness when she stands up from her wheelchair.  Is been going on for a week.  She does not walk at baseline.   She denies any focal numbness or weakness and she has an NIH stroke scale 0.  His multiple risk factors for CVA but low suspicion of CVA given his somewhat positional histories.  Nonetheless we will obtain a CT scan of the head.  Chest x-ray is reassuring.  Blood work is at baseline for the patient there is no evidence of acute anemia, she is not changed from prior, and she is no  reported GI bleeding, melena bright red blood per rectum.  Patient declines rectal exam.  Patient has chronic swelling in the right lower extremity which is not new, and she has a chronic ulcer there.  None of this seems to be acute and is not why she is here today.  I did happen to take down her dressing however to make sure it did not look particularly infected which it does not.  Her orthostatic sounding lightheadedness we will give her a liter of fluid and check a CT scan, for her foot I will get an x-ray to make sure there is no crepitus but I have low suspicion that this is an acute process, does not appear to be a DVT, is been there for several years unchanged.  Is no evidence of dysrhythmia on EKG fortunately or ischemia.  Patient has not requested for me but talk to the nurses about pain medication for her herpetic neuralgia which we are not going to be administering narcotics today for her.  I think it would most likely make her lightheadedness worse.  Patient does have a history of true vertigo which is not endorsing a spinning sensation.  ----------------------------------------- 5:11 PM on 04/29/2018 -----------------------------------------  Fluids all of patient's symptoms have cleared up and she feels no lightheadedness anymore, she is not orthostatic, we did rewrap her chronic wound.  We will have her follow-up in the wound clinic for that, as well as she is going to establish care at [facility] clinic she states and we will refer her there as well.  Patient in no acute distress, chronic anemia as noted otherwise  reassuring work-up including CT scan ankle does show worsening bone from the last image here in 2018 however care everywhere does show that there is been significant degenerative change which seems consistent with what we are seeing here.  She has no complaints of it being acutely worse and it does appear to be chronically like this according to her, certainly no crepitus no evidence of active infection at this time, this certainly could change and she will be vigilant about it.    ____________________________________________   FINAL CLINICAL IMPRESSION(S) / ED DIAGNOSES  Final diagnoses:  None      This chart was dictated using voice recognition software.  Despite best efforts to proofread,  errors can occur which can change meaning.      Jeanmarie Plant, MD 04/29/18 1523    Jeanmarie Plant, MD 04/29/18 [phone]

## 2018-11-03 ENCOUNTER — Other Ambulatory Visit: Payer: Self-pay

## 2018-11-03 ENCOUNTER — Encounter: Payer: Self-pay | Admitting: Emergency Medicine

## 2018-11-03 ENCOUNTER — Emergency Department
Admission: EM | Admit: 2018-11-03 | Discharge: 2018-11-04 | Disposition: A | Discharge status: Other Acute Inpatient Hospital | Payer: Medicaid Other | Attending: Emergency Medicine | Admitting: Emergency Medicine

## 2018-11-03 DIAGNOSIS — I10 Essential (primary) hypertension: Secondary | ICD-10-CM | POA: Insufficient documentation

## 2018-11-03 DIAGNOSIS — E119 Type 2 diabetes mellitus without complications: Secondary | ICD-10-CM | POA: Diagnosis not present

## 2018-11-03 DIAGNOSIS — X58XXXA Exposure to other specified factors, initial encounter: Secondary | ICD-10-CM | POA: Insufficient documentation

## 2018-11-03 DIAGNOSIS — F17200 Nicotine dependence, unspecified, uncomplicated: Secondary | ICD-10-CM | POA: Insufficient documentation

## 2018-11-03 DIAGNOSIS — F22 Delusional disorders: Secondary | ICD-10-CM | POA: Diagnosis not present

## 2018-11-03 DIAGNOSIS — Z79899 Other long term (current) drug therapy: Secondary | ICD-10-CM | POA: Insufficient documentation

## 2018-11-03 DIAGNOSIS — Z7982 Long term (current) use of aspirin: Secondary | ICD-10-CM | POA: Diagnosis not present

## 2018-11-03 DIAGNOSIS — S91301A Unspecified open wound, right foot, initial encounter: Secondary | ICD-10-CM | POA: Insufficient documentation

## 2018-11-03 DIAGNOSIS — Y999 Unspecified external cause status: Secondary | ICD-10-CM | POA: Insufficient documentation

## 2018-11-03 DIAGNOSIS — T148XXA Other injury of unspecified body region, initial encounter: Secondary | ICD-10-CM

## 2018-11-03 DIAGNOSIS — Y939 Activity, unspecified: Secondary | ICD-10-CM | POA: Insufficient documentation

## 2018-11-03 DIAGNOSIS — R461 Bizarre personal appearance: Secondary | ICD-10-CM | POA: Diagnosis present

## 2018-11-03 DIAGNOSIS — J45909 Unspecified asthma, uncomplicated: Secondary | ICD-10-CM | POA: Diagnosis not present

## 2018-11-03 DIAGNOSIS — Y929 Unspecified place or not applicable: Secondary | ICD-10-CM | POA: Diagnosis not present

## 2018-11-03 DIAGNOSIS — R4689 Other symptoms and signs involving appearance and behavior: Secondary | ICD-10-CM

## 2018-11-03 LAB — CBC WITH DIFFERENTIAL/PLATELET
ABS IMMATURE GRANULOCYTES: 0.04 10*3/uL (ref 0.00–0.07)
BASOS PCT: 1 %
Basophils Absolute: 0 10*3/uL (ref 0.0–0.1)
EOS ABS: 0.4 10*3/uL (ref 0.0–0.5)
Eosinophils Relative: 5 %
HCT: 27.6 % — ABNORMAL LOW (ref 36.0–46.0)
Hemoglobin: 8.6 g/dL — ABNORMAL LOW (ref 12.0–15.0)
IMMATURE GRANULOCYTES: 1 %
Lymphocytes Relative: 29 %
Lymphs Abs: 2.3 10*3/uL (ref 0.7–4.0)
MCH: 25.4 pg — AB (ref 26.0–34.0)
MCHC: 31.2 g/dL (ref 30.0–36.0)
MCV: 81.7 fL (ref 80.0–100.0)
MONO ABS: 0.7 10*3/uL (ref 0.1–1.0)
MONOS PCT: 8 %
NEUTROS ABS: 4.6 10*3/uL (ref 1.7–7.7)
NEUTROS PCT: 56 %
PLATELETS: 413 10*3/uL — AB (ref 150–400)
RBC: 3.38 MIL/uL — ABNORMAL LOW (ref 3.87–5.11)
RDW: 16.7 % — ABNORMAL HIGH (ref 11.5–15.5)
WBC: 8 10*3/uL (ref 4.0–10.5)
nRBC: 0 % (ref 0.0–0.2)

## 2018-11-03 LAB — COMPREHENSIVE METABOLIC PANEL
ALT: 9 U/L (ref 0–44)
AST: 11 U/L — ABNORMAL LOW (ref 15–41)
Albumin: 3.3 g/dL — ABNORMAL LOW (ref 3.5–5.0)
Alkaline Phosphatase: 91 U/L (ref 38–126)
Anion gap: 5 (ref 5–15)
BUN: 22 mg/dL — ABNORMAL HIGH (ref 6–20)
CO2: 25 mmol/L (ref 22–32)
Calcium: 8.5 mg/dL — ABNORMAL LOW (ref 8.9–10.3)
Chloride: 104 mmol/L (ref 98–111)
Creatinine, Ser: 1.46 mg/dL — ABNORMAL HIGH (ref 0.44–1.00)
GFR calc Af Amer: 47 mL/min — ABNORMAL LOW (ref >60)
GFR calc non Af Amer: 40 mL/min — ABNORMAL LOW (ref >60)
Glucose, Bld: 122 mg/dL — ABNORMAL HIGH (ref 70–99)
Potassium: 4.7 mmol/L (ref 3.5–5.1)
Sodium: 134 mmol/L — ABNORMAL LOW (ref 135–145)
Total Bilirubin: 0.4 mg/dL (ref 0.3–1.2)
Total Protein: 8 g/dL (ref 6.5–8.1)

## 2018-11-03 LAB — ETHANOL

## 2018-11-03 LAB — ACETAMINOPHEN LEVEL: Acetaminophen (Tylenol), Serum: <10 ug/mL — ABNORMAL LOW (ref 10–30)

## 2018-11-03 MED ORDER — SULFAMETHOXAZOLE-TRIMETHOPRIM 800-160 MG PO TABS
1.0000 | ORAL_TABLET | Freq: Two times a day (BID) | ORAL | Status: DC
  Administered 2018-11-03 – 2018-11-04 (×2): 1 via ORAL
  Filled 2018-11-03 (×2): qty 1

## 2018-11-03 MED ORDER — PANTOPRAZOLE SODIUM 40 MG PO TBEC
40.0000 mg | DELAYED_RELEASE_TABLET | Freq: Every day | ORAL | Status: DC
  Filled 2018-11-03: qty 1

## 2018-11-03 MED ORDER — LORATADINE 10 MG PO TABS
10.0000 mg | ORAL_TABLET | Freq: Every day | ORAL | Status: DC
  Filled 2018-11-03: qty 1

## 2018-11-03 MED ORDER — ASPIRIN EC 81 MG PO TBEC
81.0000 mg | DELAYED_RELEASE_TABLET | Freq: Every day | ORAL | Status: DC
  Filled 2018-11-03: qty 1

## 2018-11-03 MED ORDER — AMITRIPTYLINE HCL 50 MG PO TABS
100.0000 mg | ORAL_TABLET | Freq: Every day | ORAL | Status: DC
  Administered 2018-11-03: 100 mg via ORAL
  Filled 2018-11-03: qty 2

## 2018-11-03 MED ORDER — ALBUTEROL SULFATE (2.5 MG/3ML) 0.083% IN NEBU: 2.5000 mg | INHALATION_SOLUTION | Freq: Four times a day (QID) | RESPIRATORY_TRACT | Status: DC | PRN

## 2018-11-03 MED ORDER — METFORMIN HCL 500 MG PO TABS
1000.0000 mg | ORAL_TABLET | Freq: Two times a day (BID) | ORAL | Status: DC
  Filled 2018-11-03: qty 2

## 2018-11-03 MED ORDER — GABAPENTIN 800 MG PO TABS: 800.0000 mg | ORAL_TABLET | Freq: Two times a day (BID) | ORAL | Status: DC

## 2018-11-03 MED ORDER — GABAPENTIN 600 MG PO TABS
600.0000 mg | ORAL_TABLET | Freq: Two times a day (BID) | ORAL | Status: DC
  Administered 2018-11-03 – 2018-11-04 (×2): 600 mg via ORAL
  Filled 2018-11-03 (×2): qty 1

## 2018-11-03 MED ORDER — FLUTICASONE PROPIONATE 50 MCG/ACT NA SUSP
2.0000 | Freq: Every day | NASAL | Status: DC | PRN
  Filled 2018-11-03: qty 16

## 2018-11-03 NOTE — ED Provider Notes (Signed)
[facility] Emergency Department Provider Note    ____________________________________________   I have reviewed the triage vital signs and the nursing notes.   HISTORY  Chief Complaint IVC   History limited by: Not Limited   HPI Jackie Miles is a 53 y.o. female who presents to the emergency department today under IVC because of concerns for abnormal behavior and some aggression.  On exam patient says that her family was mad at her because she has not given them any money.  She is homeless but that she is okay with that.  She can check into hotels or stay in her car.  The patient denies any medical complaints.  However when directly asked she does states she has had an ulcer on her right foot.  It is been there for weeks and she has been evaluated at [facility] for it and that she has been taking antibiotics. She says she is not concerned about it.    Per medical record review patient has a history of DM  Past Medical History:  Diagnosis Date  . Asthma   . Diabetes mellitus without complication (HCC)   . Fibromyalgia   . H/O percutaneous left heart catheterization   . HLD (hyperlipidemia)   . Hypertension   . Shingles     Patient Active Problem List   Diagnosis Date Noted  . Cellulitis 09/07/2017  . HTN (hypertension) 05/28/2017  . Asthma 05/28/2017  . HLD (hyperlipidemia) 05/28/2017  . AKI (acute kidney injury) (HCC) 05/28/2017  . 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 Cellulitis of foot, right   . Pressure injury of skin 02/14/2017  . Osteomyelitis (HCC) 02/21/2016  . Foot infection 01/22/2016  . Sepsis (HCC) 01/12/2016    Past Surgical History:  Procedure Laterality Date  . toe amputation      Prior to Admission medications   Medication Sig Start Date End Date Taking? Authorizing Provider  albuterol (PROVENTIL HFA;VENTOLIN HFA) 108 (90 Base) MCG/ACT inhaler Inhale 2 puffs into the lungs every 6 (six) hours as needed  for wheezing or shortness of breath.    [provider]  amitriptyline (ELAVIL) 100 MG tablet Take 100 mg by mouth at bedtime.    [provider]  aspirin EC 81 MG tablet Take 81 mg by mouth daily.    [provider]  fluticasone (FLONASE) 50 MCG/ACT nasal spray Place 2 sprays into both nostrils daily as needed for rhinitis.     [provider]  gabapentin (NEURONTIN) 800 MG tablet Take 800-2,400 mg by mouth 2 (two) times daily. Pt takes one tablet in the morning and three tablets at bedtime.    [provider]  HYDROcodone-acetaminophen (NORCO/VICODIN) 5-325 MG tablet Take 1 tablet by mouth every 4 (four) hours as needed for moderate pain. 02/16/18   Cuthriell, Delorise Royals, PA-C  insulin aspart (NOVOLOG FLEXPEN) 100 UNIT/ML FlexPen Inject 7 Units into the skin every morning.     [provider]  insulin glargine (LANTUS) 100 UNIT/ML injection Inject 0.22 mLs (22 Units total) into the skin daily. Patient taking differently: Inject 21 Units into the skin daily.  05/30/17   Enid Baas, MD  ketorolac (TORADOL) 10 MG tablet Take 1 tablet (10 mg total) by mouth every 6 (six) hours as needed. 04/13/18   Enid Derry, PA-C  metFORMIN (GLUCOPHAGE) 1000 MG tablet Take 1,000 mg by mouth 2 (two) times daily with a meal.    [provider]  predniSONE (DELTASONE) 50 MG tablet Take one 50 mg tablet once daily for 5 days. 12/03/17   Orvil Feil, PA-C    Allergies Ibuprofen  Family History  Problem Relation Age of Onset  . Diabetes Mother   . Diabetes Sister     Social History Social History   Tobacco Use  . Smoking status: Current Every Day Smoker    Packs/day: 0.50  . Smokeless tobacco: Never Used  Substance Use Topics  . Alcohol use: No  . Drug use: No    Review of Systems Constitutional: No fever/chills Eyes: No visual changes. ENT: No sore throat. Cardiovascular: Denies chest pain. Respiratory: Denies shortness of  breath. Gastrointestinal: No abdominal pain.  No nausea, no vomiting.  No diarrhea.   Genitourinary: Negative for dysuria. Musculoskeletal: Negative for back pain. Skin: Negative for rash. Neurological: Negative for headaches, focal weakness or numbness.  ____________________________________________   PHYSICAL EXAM:  VITAL SIGNS: ED Triage Vitals  Enc Vitals Group     BP 11/03/18 1447 (!) 122/59     Pulse Rate 11/03/18 1447 92     Resp 11/03/18 1447 18     Temp 11/03/18 1447 98.2 F (36.8 C)     Temp Source 11/03/18 1447 Oral     SpO2 11/03/18 1447 100 %     Weight 11/03/18 1448 170 lb (77.1 kg)     Height 11/03/18 1448 5\' 8"  (1.727 m)     Head Circumference --      Peak Flow --      Pain Score 11/03/18 1448 0   Constitutional: Alert and oriented.  Eyes: Conjunctivae are normal.  ENT      Head: Normocephalic and atraumatic.      Nose: No congestion/rhinnorhea.      Mouth/Throat: Mucous membranes are moist.      Neck: No stridor. Hematological/Lymphatic/Immunilogical: No cervical lymphadenopathy. Cardiovascular: Normal rate, regular rhythm.  No murmurs, rubs, or gallops.  Respiratory: Normal respiratory effort without tachypnea nor retractions. Breath sounds are clear and equal bilaterally. No wheezes/rales/rhonchi. Gastrointestinal: Soft and non tender. No rebound. No guarding.  Genitourinary: Deferred Musculoskeletal: Normal range of motion in all extremities. No lower extremity edema. Neurologic:  Normal speech and language. No gross focal neurologic deficits are appreciated.  Skin:  Skin ulcer to right ankle.  Psychiatric: Mood and affect are normal. Speech and behavior are normal. Patient exhibits appropriate insight and judgment.  ____________________________________________    LABS (pertinent positives/negatives)  Ethanol, acetaminophen below threshold CMP na 134, glu 122, cr 1.46 CBC wbc 8.0, hgb 8.6, plt  413  ____________________________________________   EKG  None  ____________________________________________    RADIOLOGY  None  ____________________________________________   PROCEDURES  Procedures  ____________________________________________   INITIAL IMPRESSION / ASSESSMENT AND PLAN / ED COURSE  Pertinent labs & imaging results that were available during my care of the patient were reviewed by me and considered in my medical decision making (see chart for details).   Patient presented to the emergency department today under IVC.  Psychiatry evaluated and this time will plan on admission.  Patient does have a ulcer to her right foot.  No leukocytosis or fevers.  Will start patient on antibiotics.  ____________________________________________   FINAL CLINICAL IMPRESSION(S) / ED DIAGNOSES  Final diagnoses:  Abnormal behavior  Open wound of skin     Note: This dictation was prepared with Dragon dictation. Any transcriptional errors that result from this process are unintentional     Phineas Semen, MD 11/03/18  1925  

## 2018-11-03 NOTE — ED Triage Notes (Signed)
Pt arrived with BPD with commitment papers taken out by family for aggressive behavior. Pt cooperative in triage. Pt denies SI/HI

## 2018-11-03 NOTE — ED Notes (Signed)
Report to include Situation, Background, Assessment, and Recommendations received from Amy B. RN. Patient alert, warm and dry, in no acute distress. Patient refuses to answer questions about SI, HI, AVH and pain. Patient made aware of Q15 minute rounds and Psychologist, counselling presence for their safety. Patient instructed to come to me with needs or concerns.
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
## 2018-11-03 NOTE — ED Notes (Addendum)
Hourly rounding reveals patient sleeping in room. No complaints, stable, in no acute distress. Q15 minute rounds and to continue.

## 2018-11-03 NOTE — ED Notes (Signed)
Food tray was given with juice. 

## 2018-11-03 NOTE — ED Notes (Signed)
Wheeled pt to the restroom and back to bed. RN Amy gave food tray with juice.

## 2018-11-03 NOTE — ED Notes (Signed)
Pt's belongings placed into belonging bag. Contents include: hat, yellow chain necklace, yellow ring, 1 sneaker, 1 maroon sneaker, gray/green sweater, white tshirt, cloud pajama pants, glasses. Pt changed into burgundy scrubs by this RN and Shawna Orleans

## 2018-11-03 NOTE — ED Notes (Signed)
Hourly rounding reveals patient sleeping in room. No complaints, stable, in no acute distress. Q15 minute rounds and monitoring via Rover and Officer to continue.  

## 2018-11-03 NOTE — ED Notes (Signed)
Pt will be admitted to inpatient unit.

## 2018-11-03 NOTE — ED Notes (Signed)
Patient asleep in room. No noted distress or abnormal behavior. Will continue 15 minute checks and observation by security cameras for safety. 

## 2018-11-03 NOTE — ED Notes (Signed)
Hourly rounding reveals patient in room. Stable, uncooperative, in no acute distress. Q15 minute rounds and monitoring via Psychologist, counsellingover and Officer to continue.

## 2018-11-04 ENCOUNTER — Inpatient Hospital Stay
Admission: AD | Admit: 2018-11-04 | Discharge: 2018-11-06 | DRG: 885 | Disposition: A | Discharge status: Home / Self Care | Payer: Medicaid Other | Attending: Psychiatry | Admitting: Psychiatry

## 2018-11-04 ENCOUNTER — Other Ambulatory Visit: Payer: Self-pay

## 2018-11-04 DIAGNOSIS — E785 Hyperlipidemia, unspecified: Secondary | ICD-10-CM | POA: Diagnosis present

## 2018-11-04 DIAGNOSIS — L97512 Non-pressure chronic ulcer of other part of right foot with fat layer exposed: Secondary | ICD-10-CM | POA: Diagnosis present

## 2018-11-04 DIAGNOSIS — F22 Delusional disorders: Secondary | ICD-10-CM | POA: Diagnosis present

## 2018-11-04 DIAGNOSIS — X58XXXD Exposure to other specified factors, subsequent encounter: Secondary | ICD-10-CM | POA: Diagnosis not present

## 2018-11-04 DIAGNOSIS — Z9114 Patient's other noncompliance with medication regimen: Secondary | ICD-10-CM | POA: Diagnosis not present

## 2018-11-04 DIAGNOSIS — Z4800 Encounter for change or removal of nonsurgical wound dressing: Secondary | ICD-10-CM | POA: Diagnosis present

## 2018-11-04 DIAGNOSIS — Z5321 Procedure and treatment not carried out due to patient leaving prior to being seen by health care provider: Secondary | ICD-10-CM | POA: Diagnosis not present

## 2018-11-04 DIAGNOSIS — Z833 Family history of diabetes mellitus: Secondary | ICD-10-CM

## 2018-11-04 DIAGNOSIS — S91301D Unspecified open wound, right foot, subsequent encounter: Secondary | ICD-10-CM | POA: Diagnosis not present

## 2018-11-04 DIAGNOSIS — F17200 Nicotine dependence, unspecified, uncomplicated: Secondary | ICD-10-CM | POA: Diagnosis present

## 2018-11-04 DIAGNOSIS — E11621 Type 2 diabetes mellitus with foot ulcer: Secondary | ICD-10-CM | POA: Diagnosis present

## 2018-11-04 LAB — GLUCOSE, CAPILLARY
GLUCOSE-CAPILLARY: 111 mg/dL — AB (ref 70–99)
Glucose-Capillary: 116 mg/dL — ABNORMAL HIGH (ref 70–99)

## 2018-11-04 MED ORDER — METFORMIN HCL 500 MG PO TABS
1000.0000 mg | ORAL_TABLET | Freq: Two times a day (BID) | ORAL | Status: DC
  Filled 2018-11-04 (×2): qty 2

## 2018-11-04 MED ORDER — ALUM & MAG HYDROXIDE-SIMETH 200-200-20 MG/5ML PO SUSP: 30.0000 mL | ORAL | Status: DC | PRN

## 2018-11-04 MED ORDER — PANTOPRAZOLE SODIUM 40 MG PO TBEC
40.0000 mg | DELAYED_RELEASE_TABLET | Freq: Every day | ORAL | Status: DC
  Administered 2018-11-06: 40 mg via ORAL
  Filled 2018-11-04 (×2): qty 1

## 2018-11-04 MED ORDER — SULFAMETHOXAZOLE-TRIMETHOPRIM 800-160 MG PO TABS
1.0000 | ORAL_TABLET | Freq: Two times a day (BID) | ORAL | Status: DC
  Administered 2018-11-04 – 2018-11-06 (×3): 1 via ORAL
  Filled 2018-11-04 (×5): qty 1

## 2018-11-04 MED ORDER — AMITRIPTYLINE HCL 50 MG PO TABS
100.0000 mg | ORAL_TABLET | Freq: Every day | ORAL | Status: DC
  Administered 2018-11-04 – 2018-11-05 (×2): 100 mg via ORAL
  Filled 2018-11-04 (×2): qty 2

## 2018-11-04 MED ORDER — GABAPENTIN 600 MG PO TABS
600.0000 mg | ORAL_TABLET | Freq: Two times a day (BID) | ORAL | Status: DC
  Administered 2018-11-04 – 2018-11-06 (×3): 600 mg via ORAL
  Filled 2018-11-04 (×4): qty 1

## 2018-11-04 MED ORDER — ZIPRASIDONE MESYLATE 20 MG IM SOLR: 20.0000 mg | INTRAMUSCULAR | Status: DC | PRN

## 2018-11-04 MED ORDER — ALBUTEROL SULFATE (2.5 MG/3ML) 0.083% IN NEBU: 2.5000 mg | INHALATION_SOLUTION | Freq: Four times a day (QID) | RESPIRATORY_TRACT | Status: DC | PRN

## 2018-11-04 MED ORDER — LORAZEPAM 1 MG PO TABS: 1.0000 mg | ORAL_TABLET | ORAL | Status: DC | PRN

## 2018-11-04 MED ORDER — OLANZAPINE 5 MG PO TBDP: 10.0000 mg | ORAL_TABLET | Freq: Three times a day (TID) | ORAL | Status: DC | PRN

## 2018-11-04 MED ORDER — MAGNESIUM HYDROXIDE 400 MG/5ML PO SUSP: 30.0000 mL | Freq: Every day | ORAL | Status: DC | PRN

## 2018-11-04 MED ORDER — LORATADINE 10 MG PO TABS
10.0000 mg | ORAL_TABLET | Freq: Every day | ORAL | Status: DC
  Administered 2018-11-05 – 2018-11-06 (×2): 10 mg via ORAL
  Filled 2018-11-04 (×2): qty 1

## 2018-11-04 MED ORDER — FLUTICASONE PROPIONATE 50 MCG/ACT NA SUSP
2.0000 | Freq: Every day | NASAL | Status: DC | PRN
  Filled 2018-11-04: qty 16

## 2018-11-04 MED ORDER — ASPIRIN EC 81 MG PO TBEC
81.0000 mg | DELAYED_RELEASE_TABLET | Freq: Every day | ORAL | Status: DC
  Administered 2018-11-05 – 2018-11-06 (×2): 81 mg via ORAL
  Filled 2018-11-04 (×2): qty 1

## 2018-11-04 MED ORDER — ACETAMINOPHEN 325 MG PO TABS: 650.0000 mg | ORAL_TABLET | Freq: Four times a day (QID) | ORAL | Status: DC | PRN

## 2018-11-04 NOTE — ED Notes (Signed)
This RN to bedside at this time. Pt refusing medications. Pt states her lawyer is on the way due to "[She] did not sign [herself] in here and whoever is holding [her] is going to get it". Pt refusing to allow this RN to check her CBG, refusing morning medications. EDP made aware and at bedside at this time.

## 2018-11-04 NOTE — ED Notes (Signed)
Pt through grape juice against the wall. This RN to bedside, offered patient a shower. Pt states "I'll lay here, y'all can bathe me in this bed". This RN explained that patient was offered a shower, not a bed bath, since patient is ambulatory pt could ambulate to the shower. Pt then refused.

## 2018-11-04 NOTE — Progress Notes (Signed)
New admit to this unit, alert and oriented x 4 , patient is irritable , distracted  And fully hyper verbal, disheveled and appear anxious., mood is elevated , patient states that her family are mad at her because  They want to get her money and she won't give it to them. Patient exhibits bizarre behavior that is consistent with psychotic process.. patient contract for safety and denies any SI/HI/AVH as well as any symptoms of depression at this time , patient present with diabetic ulcer on her right foot requiring wound dressing daily, patient is also a wheel chair bound, body search is done by Heather/Alex. And no contraband found , skin has multiple tattoos present. Room and unit orientation complete, unit guidelines and expected behaviors explained to patient , reminded 15 minute room checks in the unit done. Patient understood information provided and acknowledged. No distress noted.

## 2018-11-04 NOTE — ED Notes (Signed)
Hourly rounding reveals patient sleeping in room. No complaints, stable, in no acute distress. Q15 minute rounds and monitoring via Rover and Officer to continue.  

## 2018-11-04 NOTE — ED Notes (Signed)
This RN asked patient if she would comply and take metformin. Pt states "Nah I ain't taking that I ain't got no sugar". This RN exited bedside and pt began yelling "alright alright alright".

## 2018-11-04 NOTE — ED Notes (Signed)
Pt came to bedside to assess patient. Pt verbally aggressive, began kicking feet at physical therapist stating she "did not need them". Pt left bedside. Dr. Rebecca Eaton, psychiatrist, made aware.

## 2018-11-04 NOTE — ED Notes (Signed)
PT refused to take medications as prescribed. Pt remains verbally aggressive and paranoid at this time.

## 2018-11-04 NOTE — ED Notes (Signed)
Pt ambulated to the door without assistance, requesting orange juice.

## 2018-11-04 NOTE — ED Notes (Signed)
Pt provided with meal tray at this time 

## 2018-11-04 NOTE — Tx Team (Signed)
pInitial Treatment Plan 11/04/2018 10:59 PM [name]:[id number]    PATIENT STRESSORS: Financial difficulties Health problems Marital or family conflict   PATIENT STRENGTHS: Ability for insight Licensed conveyancer   PATIENT IDENTIFIED PROBLEMS:   Poor physical health    Depression/Axiety    Body image Disturbance            DISCHARGE CRITERIA:  Ability to meet basic life and health needs Improved stabilization in mood, thinking, and/or behavior Need for constant or close observation no longer present  PRELIMINARY DISCHARGE PLAN: Outpatient therapy Participate in family therapy Placement in alternative living arrangements  PATIENT/FAMILY INVOLVEMENT: This treatment plan has been presented to and reviewed with the patient, Jackie Miles,   The patient have been given the opportunity to ask questions and make suggestions.  Lelan Pons, RN 11/04/2018, 10:59 PM

## 2018-11-04 NOTE — ED Notes (Signed)
Pt ambulatory to throw trash away. Pt back to bed at this time.

## 2018-11-04 NOTE — ED Notes (Signed)
Pt difficult to assess. When this RN to bedside to give meds pt initially uncooperative stating "I don't need your meds, I have my own antibiotics that are better! Y'all get out of here, I don't need you, I'm coming for whoever is keeping me here, y'all think I'm crazy, I'm going to show you crazy." Pt presents very uncooperative with staff.

## 2018-11-04 NOTE — BH Assessment (Signed)
Patient is to be admitted to [facility] by Dr. Viviano Simas.  Attending Physician will be Dr. Jennet Maduro.   Patient has been assigned to room 324, by [facility] Charge Nurse Shatara.   ER staff is aware of the admission:  Dr. Mayford Knife, ER MD   Aundra Millet, Patient's Nurse   Lupita Dawn., Patient Access.

## 2018-11-04 NOTE — ED Notes (Signed)
Pt noted to be playing in water in the sink. Pt states she is giving herself a bath. Pt given new pants as she removed her other pants at this time.

## 2018-11-04 NOTE — ED Notes (Signed)
Pt willingly allowed staff to change the dressing. Adhesive dressing applied to wound at this time.

## 2018-11-04 NOTE — ED Notes (Signed)
Wet to dry dressing applied by this RN to R lateral ankle. Assisted by Council Mechanic, EDT-P

## 2018-11-04 NOTE — ED Notes (Signed)
Pt refused meal tray at this time.

## 2018-11-04 NOTE — ED Notes (Signed)
Pt given meal tray.

## 2018-11-04 NOTE — ED Notes (Signed)
Upon this RN arrival to bedside, pt noted to be standing at the sink washing her carrots from dinner tray off. Pt ambulatory with limp back to bed at this time.

## 2018-11-04 NOTE — ED Notes (Signed)
Pt able to get herself into wheelchair without assistance. Called for assistance after she got herself into a wheelchair. Pt states "I can walk but the doctor in [place] told me I'm not supposed to put weight on it, I am supposed to be out of here at 0700, I got something for whoever is holding me here". Pt taken to the bathroom via wheelchair by Council Mechanic, EDT-P.

## 2018-11-04 NOTE — ED Notes (Signed)
Pt ambulated to door to complain that her room was too cold. This RN provided warm blankets at this time.

## 2018-11-04 NOTE — ED Notes (Signed)
Pt given meal tray at this time. Pt states she will eat a banana, explained to patient a banana was not sent for lunch. Placed at bedside for patient.

## 2018-11-04 NOTE — ED Notes (Addendum)
This RN attempted to get patient to relinquish Ace Wrap that was covering her wound. Pt refused to allow this RN to remove Ace Wrap. This RN notified EDP that patient was refusing to allow this RN to remove ace wrap. EDP to bedside to attempt to remove Ace Wrap. Pt refused to allow him. Pt then began babbling and yelling at EDP that she would not remove ace bandage and became increasingly verbally aggressive with EDP. Security notified at this time.

## 2018-11-04 NOTE — Progress Notes (Signed)
PT Cancellation Note  Patient Details Name: Jackie Miles MRN: [id number] DOB: 06-20-1966   Cancelled Treatment:    Reason Eval/Treat Not Completed: Patient declined, no reason specified.  Pt refused, stating that she didn't have any issues with her legs or feet.  Pt laid back on bed kicking her feet in direction of therapist, stating "see I can move!".  Pt has been walking in room today, per nursing.   Will complete order at this time.  Glenetta Hew, PT, DPT  Glenetta Hew 11/04/2018, 11:21 AM

## 2018-11-05 DIAGNOSIS — F22 Delusional disorders: Principal | ICD-10-CM

## 2018-11-05 LAB — GLUCOSE, CAPILLARY
Glucose-Capillary: 103 mg/dL — ABNORMAL HIGH (ref 70–99)
Glucose-Capillary: 131 mg/dL — ABNORMAL HIGH (ref 70–99)
Glucose-Capillary: 111 mg/dL — ABNORMAL HIGH (ref 70–99)

## 2018-11-05 MED ORDER — MUPIROCIN CALCIUM 2 % EX CREA
TOPICAL_CREAM | Freq: Every day | CUTANEOUS | Status: DC
  Filled 2018-11-05: qty 15
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
## 2018-11-05 NOTE — Progress Notes (Signed)
Patient refused majority of her morning medications stating that she wasn't taking any of those medications today. Patient also stated that she would take her antibiotic later. MD was notified.

## 2018-11-05 NOTE — Progress Notes (Signed)
Recreation Therapy Notes  INPATIENT RECREATION THERAPY ASSESSMENT  Patient Details Name: Jackie ArgyleCathy D Miles MRN: [id number] DOB: [birth year] Today's Date: 11/05/2018       Information Obtained From: Patient(Patient stated she did not need anything she is leaving soon)  Able to Participate in Assessment/Interview:    Patient Presentation:    Reason for Admission (Per Patient):    Patient Stressors:    Coping Skills:      Leisure Interests (2+):     Frequency of Recreation/Participation:    Awareness of Community Resources:     [employer] Resources:     Current Use:    If no, Barriers?:    Expressed Interest in [employer] Resource Information:    [place]     Patient Main Form of Transportation:    Patient Strengths:     Patient Identified Areas of Improvement:     Patient Goal for Hospitalization:     Current SI (including self-harm):     Current HI:     Current AVH:    Staff Intervention Plan:    Consent to Intern Participation:    Wilkins Elpers 11/05/2018, 3:26 PM

## 2018-11-05 NOTE — Progress Notes (Signed)
Patient refuse CBG monitoring.

## 2018-11-05 NOTE — Progress Notes (Signed)
Recreation Therapy Notes  Date: 11/05/2018  Time: 9:30 am   Location: Craft room   Behavioral response: N/A   Intervention Topic: Social skills  Discussion/Intervention: Patient did not attend group.   Clinical Observations/Feedback:  Patient did not attend group.   Betzabeth Derringer LRT/CTRS        Aysha Livecchi 11/05/2018 11:00 AM

## 2018-11-05 NOTE — H&P (Signed)
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 Ms. Jackie Miles is a 53 year old female with no past psychiatric history.  Chief complaint. "I am fine, Ma."  History of present illness. Information was obtained from the patient and the chart. The patient was petitioned by her daughter in law for poor self care, paranoid delusions and strange behavior lasting one year. The patient has nonhealing diabetic ulcer on her foot and reportedly, has not been careful with caring for it herself enough, after she narrowly escaped amputation. She has been refusing medications including Metformin or prescribed antibiotics. She became paranoid and delusional believing that she recently "camer to money" and that the family is stealing from her. She did see a "women taking her money". She also made some references that FBI is after her. There are no reports pf suicidal or homicidal thinking.  The patient herself, adamantly denies any symptoms of depression, anxiety or psychosis. Thinks that the family is crazy. She was unable to work with wound consult nurse insisting that she knows well how to care for her wound. Of note, she told me that she was 53 years old.  Past psychiatric history. None reported.  Family history. Everybody crazy.  Social history. Apparently, she has been living in this area for a time[place]. She adamantly refuses family meeting or any medication changes.   Total Time spent with patient: 1 hour  Is the patient at risk to self? No.  Has the patient been a risk to self in the past 6 months?  No.  Has the patient been a risk to self within the distant past? No.  Is the patient a risk to others? No.  Has the patient been a risk to others in the past 6 months? No.  Has the patient been a risk to others within the distant past? No.   Prior Inpatient Therapy:   Prior Outpatient Therapy:    Alcohol Screening: 1. How often do you have a drink containing alcohol?: Monthly or less 2. How many drinks containing alcohol do you have on a typical day when you are drinking?: 1 or 2 3. How often do you have six or more drinks on one occasion?: Never AUDIT-C Score: 1 4. How often during the last year have you found that you were not able to stop drinking once you had started?: Less than monthly 5. How often during the last year have you failed to do what was normally expected from you becasue of drinking?: Less than monthly 6. How often during the last year have you needed a first drink in the morning to get yourself going after a heavy drinking session?: Less than monthly 7. How often during the last year have you had a feeling of guilt of remorse after drinking?: Less than monthly 8. How often during the last year have you been unable to remember what happened the night before because you had been drinking?: Less than monthly 9. Have you or someone else been injured as a result of your drinking?: No 10. Has a relative or friend or a doctor or another health worker been concerned about your drinking or suggested  you cut down?: No Alcohol Use Disorder Identification Test Final Score (AUDIT): 6 Alcohol Brief Interventions/Follow-up: Alcohol Education Substance Abuse History in the last 12 months:  No. Consequences of Substance Abuse: NA Previous Psychotropic Medications: No  Psychological Evaluations: No  Past Medical History:  Past Medical History:  Diagnosis Date  . Asthma   . Diabetes mellitus without complication (HCC)   . Fibromyalgia   . H/O percutaneous left heart catheterization    . HLD (hyperlipidemia)   . Hypertension   . Shingles     Past Surgical History:  Procedure Laterality Date  . toe amputation     Family History:  Family History  Problem Relation Age of Onset  . Diabetes Mother   . Diabetes Sister    Tobacco Screening: Have you used any form of tobacco in the last 30 days? (Cigarettes, Smokeless Tobacco, Cigars, and/or Pipes): No Social History:  Social History   Substance and Sexual Activity  Alcohol Use No     Social History   Substance and Sexual Activity  Drug Use No    Additional Social History: Marital status: Married Number of Years Married: 30 What types of issues is patient dealing with in the relationship?: Pt says the relationship with her wife is "perfect" Additional relationship information: Pt states her wife is living in [place] and caring for her ill mother Are you sexually active?: (None reported) What is your sexual orientation?: Lesbian Has your sexual activity been affected by drugs, alcohol, medication, or emotional stress?: None reported Does patient have children?: Yes How many children?: 4 How is patient's relationship with their children?: Pt states she is not pleased with any of her family right now.                         Allergies:   Allergies  Allergen Reactions  . Ibuprofen Other (See Comments)    Reaction:  Acid reflux    Lab Results:  Results for orders placed or performed during the hospital encounter of 11/04/18 (from the past 48 hour(s))  Glucose, capillary     Status: Abnormal   Collection Time: 11/04/18  8:42 PM  Result Value Ref Range   Glucose-Capillary 116 (H) 70 - 99 mg/dL   Comment 1 Notify RN   Glucose, capillary     Status: Abnormal   Collection Time: 11/05/18  7:11 AM  Result Value Ref Range   Glucose-Capillary 103 (H) 70 - 99 mg/dL   Comment 1 Notify RN   Glucose, capillary     Status: Abnormal   Collection Time: 11/05/18 11:22 AM  Result Value Ref Range    Glucose-Capillary 131 (H) 70 - 99 mg/dL    Blood Alcohol level:  Lab Results  Component Value Date   ETH <10 11/03/2018    Metabolic Disorder Labs:  Lab Results  Component Value Date   HGBA1C >15.5 (H) 05/28/2017   MPG >398 05/28/2017   MPG 352 02/14/2017   No results found for: PROLACTIN No results found for: CHOL, TRIG, HDL, CHOLHDL, VLDL, LDLCALC  Current Medications: Current Facility-Administered Medications  Medication Dose Route Frequency Provider Last Rate Last Dose  . acetaminophen (TYLENOL) tablet 650 mg  650 mg Oral Q6H PRN Mariel Craft, MD      . albuterol (PROVENTIL) (2.5 MG/3ML) 0.083% nebulizer solution 2.5 mg  2.5 mg Inhalation Q6H PRN Mariel Craft, MD      . alum & mag hydroxide-simeth (MAALOX/MYLANTA) 200-200-20 MG/5ML suspension  30 mL  30 mL Oral Q4H PRN Mariel Craft, MD      . amitriptyline Caleb Popp) tablet 100 mg  100 mg Oral QHS Mariel Craft, MD   100 mg at 11/04/18 2147  . aspirin EC tablet 81 mg  81 mg Oral Daily Mariel Craft, MD   81 mg at 11/05/18 1121  . fluticasone (FLONASE) 50 MCG/ACT nasal spray 2 spray  2 spray Each Nare Daily PRN Mariel Craft, MD      . gabapentin (NEURONTIN) tablet 600 mg  600 mg Oral BID Mariel Craft, MD   600 mg at 11/04/18 2147  . loratadine (CLARITIN) tablet 10 mg  10 mg Oral Daily Mariel Craft, MD   10 mg at 11/05/18 1122  . magnesium hydroxide (MILK OF MAGNESIA) suspension 30 mL  30 mL Oral Daily PRN Mariel Craft, MD      . metFORMIN (GLUCOPHAGE) tablet 1,000 mg  1,000 mg Oral BID WC Mariel Craft, MD      . mupirocin cream (BACTROBAN) 2 %   Topical Daily Damare Serano B, MD      . pantoprazole (PROTONIX) EC tablet 40 mg  40 mg Oral Daily Mariel Craft, MD      . sulfamethoxazole-trimethoprim (BACTRIM DS,SEPTRA DS) 800-160 MG per tablet 1 tablet  1 tablet Oral BID Mariel Craft, MD   1 tablet at 11/04/18 2151   PTA Medications: Medications Prior to Admission  Medication Sig  Dispense Refill Last Dose  . HYDROcodone-acetaminophen (NORCO/VICODIN) 5-325 MG tablet Take 1 tablet by mouth every 4 (four) hours as needed for moderate pain. (Patient not taking: Reported on 11/03/2018) 20 tablet 0 Completed Course at Unknown time  . insulin aspart (NOVOLOG FLEXPEN) 100 UNIT/ML FlexPen Inject 7 Units into the skin every morning.    11/27/2017 at Unknown time  . insulin glargine (LANTUS) 100 UNIT/ML injection Inject 0.22 mLs (22 Units total) into the skin daily. (Patient not taking: Reported on 11/03/2018) 10 mL 11 Not Taking at Unknown time  . ketorolac (TORADOL) 10 MG tablet Take 1 tablet (10 mg total) by mouth every 6 (six) hours as needed. (Patient not taking: Reported on 11/03/2018) 20 tablet 0 Completed Course at Unknown time  . predniSONE (DELTASONE) 50 MG tablet Take one 50 mg tablet once daily for 5 days. (Patient not taking: Reported on 11/03/2018) 5 tablet 0 Completed Course at Unknown time    Musculoskeletal: Strength & Muscle Tone: within normal limits Gait & Station: unsteady Patient leans: N/A  Psychiatric Specialty Exam: I reviewed physical exam performed in the ER and agree with the findings. Physical Exam  Nursing note and vitals reviewed. Psychiatric: She has a normal mood and affect. Her speech is normal and behavior is normal. Thought content is paranoid and delusional. Cognition and memory are normal. She expresses impulsivity.    Review of Systems  Neurological: Negative.   Psychiatric/Behavioral: Negative.   All other systems reviewed and are negative.   Blood pressure 111/68, pulse 81, temperature 98.7 F (37.1 C), temperature source Oral, resp. rate 18, height 5\' 4"  (1.626 m), weight 86.2 kg, SpO2 95 %.Body mass index is 32.61 kg/m.  See SRA Other:     Treatment plan:  Ms. Zoltowski is a 53 year old female with no past psychiatric history petitioned by her family for poor self care and paranoid delusions.  #Paranoia -medications were offered but the  patient refuses  #Diabetic foot wound -under care of [facility] -wound  care consult appreciated -patient refuses Bactrim -no new orders -patient claims she can take care of it herself and refuses any assistance here or in the community  #Diabetes -refuses Metformin -accepts Neurontin 600 mg BID and Elavil 100 mg nightly  #Disposition -claims to be in [facility] only temporary and [place] -follow up [facility]   Physician Treatment Plan for Primary Diagnosis: Delusional disorder (HCC) Long Term Goal(s): Improvement in symptoms so as ready for discharge  Short Term Goals: Ability to identify changes in lifestyle to reduce recurrence of condition will improve, Ability to verbalize feelings will improve, Ability to disclose and discuss suicidal ideas, Ability to demonstrate self-control will improve, Ability to identify and develop effective coping behaviors will improve, Ability to maintain clinical measurements within normal limits will improve, Compliance with prescribed medications will improve and Ability to identify triggers associated with substance abuse/mental health issues will improve  Physician Treatment Plan for Secondary Diagnosis: Principal Problem:   Delusional disorder (HCC) Active Problems:   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    Kristine Linea, MD 2/6/20205:07 PM

## 2018-11-05 NOTE — Progress Notes (Signed)
D- Patient alert and oriented. Patient presents in a slightly irritable mood on assessment stating that she didn't sleep well and that she's upset about being on the unit "I need to talk to whoever sent me down here because we're going to have a problem". When this writer asked patient about SI/HI, she stated that "you picked the wrong time to ask me that question". Patient stated that "my daddy's an [place], he's always seeing things, they taught [place] about all the beings" when asked about AVH. Patient denied depression and anxiety stating "I ain't never had no depression". Patient had no stated goals for today, however, she stated that she wanted this writer to call a lawyer for her and tell them that she needs to talk to them.  A- Scheduled medications administered to patient, per MD orders. Support and encouragement provided.  Routine safety checks conducted every 15 minutes.  Patient informed to notify staff with problems or concerns.  R- No adverse drug reactions noted. Patient contracts for safety at this time. Patient compliant with medications and treatment plan. Patient receptive, calm, and cooperative. Patient interacts well with others on the unit.  Patient remains safe at this time.

## 2018-11-05 NOTE — BHH Group Notes (Signed)
Balance In Life 11/05/2018 1PM  Type of Therapy/Topic:  Group Therapy:  Balance in Life  Participation Level:  Active  Description of Group:   This group will address the concept of balance and how it feels and looks when one is unbalanced. Patients will be encouraged to process areas in their lives that are out of balance and identify reasons for remaining unbalanced. Facilitators will guide patients in utilizing problem-solving interventions to address and correct the stressor making their life unbalanced. Understanding and applying boundaries will be explored and addressed for obtaining and maintaining a balanced life. Patients will be encouraged to explore ways to assertively make their unbalanced needs known to significant others in their lives, using other group members and facilitator for support and feedback.  Therapeutic Goals: 1. Patient will identify two or more emotions or situations they have that consume much of in their lives. 2. Patient will identify signs/triggers that life has become out of balance:  3. Patient will identify two ways to set boundaries in order to achieve balance in their lives:  4. Patient will demonstrate ability to communicate their needs through discussion and/or role plays  Summary of Patient Progress:  Actively and appropriately engaged in the group. Patient was able to provide support and validation to other group members.Patient discussed with group how she uses her faith in God to help her overcome challenges and find balance in life. Patient respected boundaries during session and was receptive to feedback from members.  Therapeutic Modalities:   Cognitive Behavioral Therapy Solution-Focused Therapy Assertiveness Training  [place] Philip Aspen, LCSW
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
## 2018-11-05 NOTE — Consult Note (Signed)
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  Previous amputation of right fifth metatarsal. Patient has been seen at [facility] and states that she manages this wound herself.  She is unable to recall any recommended treatment formerly prescribed but recalls that she cleans and dresses it daily.  Patient is agitated and wants to go home.  Generally uncooperative with wound assessment and treatment today.   Wound type: Nonhealing neuropathic Pressure Injury POA: NA Measurement: Right lateral malleolus:  2 cmx 2 cm wound bed is 100% adherent fibrin slough.   Wound bed: see above Drainage (amount, consistency, odor) Moderate purulence noted today.  Periwound: Malleolus protrudes in an atypical degree and likely contributes to pressure and nonhealing of wound.  Dressing procedure/placement/frequency: Cleanse right malleolus with NS and pat dry.  Apply mupirocin to wound bed.  Cover with gauze and kerlix/tape.  Change daily. Will not follow at this time.  Please re-consult if needed.  Maple [name] MSN, RN, FNP-BC CWON Wound, Ostomy, Continence Nurse Pager [phone]
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
## 2018-11-05 NOTE — BHH Suicide Risk Assessment (Signed)
[facility]   Nursing information obtained from:  Patient Demographic factors:  Low socioeconomic status Current Mental Status:  Thoughts of violence towards others Loss Factors:  Decline in physical health Historical Factors:  Impulsivity Risk Reduction Factors:  Positive therapeutic relationship  Total Time spent with patient: 1 hour Principal Problem: Delusional disorder (HCC) Diagnosis:  Principal Problem:   Delusional disorder (HCC) Active Problems:   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 layer exposed (HCC)  Subjective Data: delusions, decreased self care  Continued Clinical Symptoms:  Alcohol Use Disorder Identification Test Final Score (AUDIT): 6 The "Alcohol Use Disorders Identification Test", Guidelines for Use in Primary Care, Second Edition.  World Science writerHealth Organization [facility](WHO). Score between 0-7:  no or low risk or alcohol related problems. Score between 8-15:  moderate risk of alcohol related problems. Score between 16-19:  high risk of alcohol related problems. Score 20 or above:  warrants further diagnostic evaluation for alcohol dependence and treatment.   CLINICAL FACTORS:   Currently Psychotic Unstable or Poor Therapeutic Relationship Medical Diagnoses and Treatments/Surgeries   Musculoskeletal: Strength & Muscle Tone: within normal limits Gait & Station: unable to bear weight on the right due to ulcer Patient leans: N/A  Psychiatric Specialty Exam: Physical Exam  Nursing note and vitals reviewed. Psychiatric: She has a normal mood and affect. Her speech is normal and behavior is normal. Thought content is delusional. Cognition and memory are normal. She expresses impulsivity.    Review of Systems  Neurological: Negative.   Psychiatric/Behavioral: Negative.   All other systems reviewed and are negative.   Blood pressure 111/68, pulse 81, temperature 98.7 F (37.1 C), temperature source Oral, resp. rate 18, height 5\' 4"  (1.626  m), weight 86.2 kg, SpO2 95 %.Body mass index is 32.61 kg/m.  General Appearance: Casual  Eye Contact:  Good  Speech:  Clear and Coherent  Volume:  Normal  Mood:  Euthymic  Affect:  Appropriate  Thought Process:  Goal Directed and Descriptions of Associations: Intact  Orientation:  Full (Time, Place, and Person)  Thought Content:  Delusions  Suicidal Thoughts:  No  Homicidal Thoughts:  No  Memory:  Immediate;   Fair Recent;   Fair Remote;   Fair  Judgement:  Impaired  Insight:  Shallow  Psychomotor Activity:  Normal  Concentration:  Concentration: Fair and Attention Span: Fair  Recall:  [employer]Fair  Fund of Knowledge:  Fair  Language:  Fair  Akathisia:  No  Handed:  Right  AIMS (if indicated):     Assets:  Communication Skills Desire for Improvement Financial Resources/Insurance Resilience  ADL's:  Intact  Cognition:  WNL  Sleep:  Number of Hours: 5.15      COGNITIVE FEATURES THAT CONTRIBUTE TO RISK:  None    SUICIDE RISK:   Minimal: No identifiable suicidal ideation.  Patients presenting with no risk factors but with morbid ruminations; may be classified as minimal risk based on the severity of the depressive symptoms  PLAN OF CARE: hospital admission, medication management, discharge planning.  Jackie Miles is a 53 year old female with no past psychiatric history petitioned by her family for poor self care and paranoid delusions.  #Paranoia -medications were offered but the patient refuses  #Diabetic foot wound -under care of [facility] -wound care consult appreciated -patient refuses Bactrim -no new orders -patient claims she can take care of it herself and refuses any assistance here or in the community  #Diabetes -refuses Metformin -accepts Neurontin 600 mg BID and Elavil  100 mg nightly  #Disposition -[place] -follow up TBE  I certify that inpatient  services furnished can reasonably be expected to improve the patient's condition.   Kristine LineaJolanta Zakaiya Lares, MD 11/05/2018, 4:59 PM

## 2018-11-06 ENCOUNTER — Encounter: Payer: Self-pay | Admitting: Emergency Medicine

## 2018-11-06 ENCOUNTER — Emergency Department
Admission: EM | Admit: 2018-11-06 | Discharge: 2018-11-06 | Disposition: A | Discharge status: Home / Self Care | Payer: Medicaid Other | Attending: Emergency Medicine | Admitting: Emergency Medicine

## 2018-11-06 DIAGNOSIS — X58XXXD Exposure to other specified factors, subsequent encounter: Secondary | ICD-10-CM | POA: Insufficient documentation

## 2018-11-06 DIAGNOSIS — Z5321 Procedure and treatment not carried out due to patient leaving prior to being seen by health care provider: Secondary | ICD-10-CM | POA: Insufficient documentation

## 2018-11-06 DIAGNOSIS — S91301D Unspecified open wound, right foot, subsequent encounter: Secondary | ICD-10-CM | POA: Insufficient documentation

## 2018-11-06 DIAGNOSIS — F17200 Nicotine dependence, unspecified, uncomplicated: Secondary | ICD-10-CM | POA: Diagnosis present

## 2018-11-06 LAB — GLUCOSE, CAPILLARY: Glucose-Capillary: 113 mg/dL — ABNORMAL HIGH (ref 70–99)

## 2018-11-06 LAB — LIPID PANEL
Cholesterol: 132 mg/dL (ref 0–200)
HDL: 26 mg/dL — ABNORMAL LOW (ref >40)
LDL CALC: 74 mg/dL (ref 0–99)
Total CHOL/HDL Ratio: 5.1 RATIO
Triglycerides: 161 mg/dL — ABNORMAL HIGH (ref <150)
VLDL: 32 mg/dL (ref 0–40)

## 2018-11-06 LAB — HEMOGLOBIN A1C
Hgb A1c MFr Bld: 6.9 % — ABNORMAL HIGH (ref 4.8–5.6)
Mean Plasma Glucose: 151.33 mg/dL

## 2018-11-06 LAB — TSH: TSH: 2.351 u[IU]/mL (ref 0.350–4.500)

## 2018-11-06 MED ORDER — MUPIROCIN CALCIUM 2 % EX CREA: TOPICAL_CREAM | Freq: Every day | CUTANEOUS | 0 refills | Status: DC

## 2018-11-06 MED ORDER — AMITRIPTYLINE HCL 100 MG PO TABS: 100.0000 mg | ORAL_TABLET | Freq: Every day | ORAL | 0 refills | Status: DC

## 2018-11-06 MED ORDER — METFORMIN HCL 1000 MG PO TABS: 1000.0000 mg | ORAL_TABLET | Freq: Two times a day (BID) | ORAL | 1 refills | Status: DC

## 2018-11-06 MED ORDER — GABAPENTIN 600 MG PO TABS: 600.0000 mg | ORAL_TABLET | Freq: Two times a day (BID) | ORAL | 1 refills | Status: DC

## 2018-11-06 NOTE — BHH Counselor (Signed)
CSW made 2nd attempt to complete SPE with pt's son(Rico Chestine Spore [phone])  at 1:19pm, left VM no returned call.

## 2018-11-06 NOTE — Discharge Summary (Signed)
Physician Discharge Summary Note  Patient:  Jackie Miles is an 53 y.o., female MRN:  [id number] DOB:  [birth year] Patient phone:  [phone] (home)  Patient address:   [address] [place] [id number],  Total Time spent with patient: 20 minutes plus 15 min on care coordination and documentation  Date of Admission:  11/04/2018 Date of Discharge: 11/06/2018  Reason for Admission:  Delusions.  History of Present Illness:   Identifying data. Jackie Miles is a 53 year old female with no past psychiatric history.  Chief complaint. "I am fine, Ma."  History of present illness. Information was obtained from the patient and the chart. The patient was petitioned by her daughter in law for poor self care, paranoid delusions and strange behavior lasting one year. The patient has nonhealing diabetic ulcer on her foot and reportedly, has not been careful with caring for it herself enough, after she narrowly escaped amputation. She has been refusing medications including Metformin or prescribed antibiotics. She became paranoid and delusional believing that she recently "camer to money" and that the family is stealing from her. She did see a "women taking her money". She also made some references that FBI is after her. There are no reports pf suicidal or homicidal thinking.  The patient herself, adamantly denies any symptoms of depression, anxiety or psychosis. Thinks that the family is crazy. She was unable to work with wound consult nurse insisting that she knows well how to care for her wound. Of note, she told me that she was 53 years old.  Past psychiatric history. None reported.  Family history. Everybody crazy.  Social history. Apparently, she has been living in this area for a time. [place]. She adamantly refuses family meeting or any medication changes.   Principal Problem: Delusional disorder [facility](HCC) Discharge Diagnoses:  Principal Problem:   Delusional disorder (HCC) Active Problems:   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 layer exposed (HCC)   Tobacco use disorder   Past Medical History:  Past Medical History:  Diagnosis Date  . Asthma   . Diabetes mellitus without complication (HCC)   . Fibromyalgia   . H/O percutaneous left heart catheterization   . HLD (hyperlipidemia)   . Hypertension   . Shingles     Past Surgical History:  Procedure Laterality Date  . toe amputation     Family History:  Family History  Problem Relation Age of Onset  . Diabetes Mother   . Diabetes Sister    Social History:  Social History   Substance and Sexual Activity  Alcohol Use No     Social History   Substance and Sexual Activity  Drug Use No    Social History   Socioeconomic History  . Marital status: Single    Spouse name: Not on file  . Number of children: Not on file  . Years of education: Not on file  . Highest education level: Not on file  Occupational History  . Not on file  Social Needs  . Financial resource strain: Not on file  . Food insecurity:    Worry: Not on file    Inability: Not on file  . Transportation needs:    Medical: Not on file    Non-medical: Not on file  Tobacco Use  . Smoking status: Current Every Day Smoker    Packs/day: 0.50  . Smokeless tobacco: Never Used  Substance and Sexual Activity  . Alcohol use:  No  . Drug use: No  . Sexual activity: Not on file  Lifestyle  . Physical activity:    Days per week: Not on file    Minutes per session: Not on file  . Stress: Not on file  Relationships  . Social connections:    Talks on phone: Not on file    Gets together: Not on file    Attends religious service: Not on file    Active member of club or organization: Not on file    Attends meetings of clubs or organizations: Not on file    Relationship status: Not on file  Other Topics Concern  . Not on file  Social History Narrative  . Not on file     Hospital Course:    Jackie Miles is a 53 year old female with no past psychiatric history petitioned by her family for poor self care and paranoid delusions. The patient adamantly denies any problems and declined any medication adjustment, refused most of her medications in the hospital with the exception of Elavil and Gabapentin. She refused family meeting. The patient is slightly paranoid and delusional at baseline but she is not suicidal or homicidal.   #Paranoia -medications were offered but the patient refuses  #Diabetic foot wound, poorly healing -under care of [facility] -wound care consult appreciated -patient refuses Bactrim or any suggestions to improve care -patient claims she can take care of it herself and refuses any assistance here or in the community  #Diabetes, BS are stable even without treatment -refuses Metformin -accepts Neurontin 600 mg BID and Elavil 100 mg nightly  #Smoking cessation -nicotine patch was available  #Disposition -[place] and [place], as soon as she lives the hospital -refuses any assistance in setting up psychiatric follow up   Physical Findings: AIMS: Facial and Oral Movements Muscles of Facial Expression: None, normal Lips and Perioral Area: None, normal Jaw: None, normal Tongue: None, normal,Extremity Movements Upper (arms, wrists, hands, fingers): None, normal Lower (legs, knees, ankles, toes): None, normal, Trunk Movements Neck, shoulders, hips: None, normal, Overall Severity Severity of abnormal movements (highest score from questions above): None, normal Incapacitation due to abnormal movements: None, normal Patient's awareness of abnormal movements (rate only patient's report): No Awareness, Dental Status Current problems with teeth and/or dentures?: No Does patient usually wear dentures?: No  CIWA:  CIWA-Ar Total: 4 COWS:   COWS Total Score: 2  Musculoskeletal: Strength & Muscle Tone: within normal limits Gait & Station: normal Patient leans: N/A  Psychiatric Specialty Exam: Physical Exam  Nursing note and vitals reviewed. Psychiatric: She has a normal mood and affect. Her speech is normal and behavior is normal. Thought content is paranoid and delusional. Cognition and memory are normal. She expresses impulsivity.    Review of Systems  Skin:       Diabetic uncer  Neurological: Negative.   Psychiatric/Behavioral: Negative.   All other systems reviewed and are negative.   Blood pressure 112/70, pulse 88, temperature 97.9 F (36.6 C), resp. rate 18, height 5\' 4"  (1.626 m), weight 86.2 kg, SpO2 100 %.Body mass index is 32.61 kg/m.  General Appearance: Casual  Eye Contact:  Good  Speech:  Clear and Coherent  Volume:  Normal  Mood:  Euthymic  Affect:  Appropriate  Thought Process:  Goal Directed and Descriptions of Associations: Intact  Orientation:  Full (Time, Place, and Person)  Thought Content:  Delusions  and Paranoid Ideation  Suicidal Thoughts:  No  Homicidal Thoughts:  No  Memory:  Immediate;   Fair Recent;   Fair Remote;   Fair  Judgement:  Impaired  Insight:  Lacking  Psychomotor Activity:  Normal  Concentration:  Concentration: Fair and Attention Span: Fair  Recall:  [employer] of Knowledge:  Fair  Language:  Fair  Akathisia:  No  Handed:  Right  AIMS (if indicated):     Assets:  Communication Skills Desire for Improvement Financial Resources/Insurance Housing Resilience Social Support Transportation  ADL's:  Intact  Cognition:  WNL  Sleep:  Number of Hours: 5.5     Have you used any form of tobacco in the last 30 days? (Cigarettes, Smokeless Tobacco, Cigars, and/or Pipes): No  Has this patient used any form of tobacco in the last 30 days? (Cigarettes, Smokeless Tobacco, Cigars, and/or Pipes) Yes, Yes, A prescription for an FDA-approved tobacco cessation medication was  offered at discharge and the patient refused  Blood Alcohol level:  Lab Results  Component Value Date   ETH <10 11/03/2018    Metabolic Disorder Labs:  Lab Results  Component Value Date   HGBA1C >15.5 (H) 05/28/2017   MPG >398 05/28/2017   MPG 352 02/14/2017   No results found for: PROLACTIN Lab Results  Component Value Date   CHOL 132 11/06/2018   TRIG 161 (H) 11/06/2018   HDL 26 (L) 11/06/2018   CHOLHDL 5.1 11/06/2018   VLDL 32 11/06/2018   LDLCALC 74 11/06/2018    See Psychiatric Specialty Exam and Suicide Risk Assessment completed by Attending Physician prior to discharge.  Discharge destination:  Home  Is patient on multiple antipsychotic therapies at discharge:  No   Has Patient had three or more failed trials of antipsychotic monotherapy by history:  No  Recommended Plan for Multiple Antipsychotic Therapies: NA  Discharge Instructions    Diet - low sodium heart healthy   Complete by:  As directed    Increase activity slowly   Complete by:  As directed      Allergies as of 11/06/2018      Reactions   Ibuprofen Other (See Comments)   Reaction:  Acid reflux       Medication List    STOP taking these medications   HYDROcodone-acetaminophen 5-325 MG tablet Commonly known as:  NORCO/VICODIN   insulin glargine 100 UNIT/ML injection Commonly known as:  LANTUS   ketorolac 10 MG tablet Commonly known as:  TORADOL   NOVOLOG FLEXPEN 100 UNIT/ML FlexPen Generic drug:  insulin aspart   predniSONE 50 MG tablet Commonly known as:  DELTASONE     TAKE these medications     Indication  amitriptyline 100 MG tablet Commonly known as:  ELAVIL Take 1 tablet (100 mg total) by mouth at bedtime.  Indication:  Depression   gabapentin 600 MG tablet Commonly known as:  NEURONTIN Take 1 tablet (600 mg total) by mouth 2 (two) times daily.  Indication:  Diabetes with Nerve Disease   metFORMIN 1000 MG tablet Commonly known as:  GLUCOPHAGE Take 1 tablet (1,000 mg  total) by mouth 2 (two) times daily with a meal.  Indication:  Type 2 Diabetes   mupirocin cream 2 % Commonly known as:  BACTROBAN Apply topically daily. Start taking on:  November 07, 2018  Indication:  Skin Ulcer        Follow-up recommendations:  Activity:  activity as tolerated Diet:  low sodium heart healthy ADA diet Other:  keep follow up appointments  Comments:    Signed: Kristine Linea, MD 11/06/2018, 9:28 AM

## 2018-11-06 NOTE — Plan of Care (Signed)
Patient was observed interacting appropriately with staff and peers.   Problem: Coping: Goal: Ability to interact with others will improve Outcome: Progressing

## 2018-11-06 NOTE — ED Notes (Signed)
Pt not  Seen in lobby, called multiple times

## 2018-11-06 NOTE — Progress Notes (Signed)
Patient denies SI/HI, denies A/V hallucinations. Patient verbalizes understanding of discharge instructions, follow up care. Patient given all belongings from [facility] locker. Patient escorted out by staff since patient stated that she parked at the ED.When reached outside patient states"I don't feel like driving now my son is coming to pick me up."Patient waiting at the visitors entarence.Informed receptionist to give [place] a call at [facility] if the ride does not show up.

## 2018-11-06 NOTE — Progress Notes (Signed)
D - Patient was in the day room upon arrival to the unit. Patient was pleasant during assessment and medication administration. Patient denies SI/HI/AVH, depression and anxiety with this Clinical research associatewriter. When asked to assess the patients diabetic ulcer the patient stated, "They looked at it today, I am not letting anyone else look at it until tomorrow, there isn't any reason for you to look at it tonight." Patient also refused to get her blood sugar reading this evening stating, "I am not a diabetic!" Patient given education.   A - Patient compliant with medication administration per MD orders and procedures on the unit. Patient given education. Patient given support and encouragement to be active in her treatment plan. Patient informed to let staff know if there are any issues or problems on the unit.   R - Patient being monitored Q 15 minutes for safety per unit protocol. Patient remains safe on the unit at this time.

## 2018-11-06 NOTE — ED Triage Notes (Signed)
Correction, pt was discharged from the behavioral unit downstairs. Pt states they didn't know how to wrap her foot so she came here to have it wrapped before her rife came.

## 2018-11-06 NOTE — Progress Notes (Signed)
  [facility] Adult Case Management Discharge Plan :  Will you be returning to the same living situation after discharge:  Yes,  pt says she will be returning to her home in [place], [place] At discharge, do you have transportation home?: Yes,  pt says son will assist with transportation Do you have the ability to pay for your medications: Yes,  insurance  Release of information consent forms completed and in the chart;  Patient's signature needed at discharge.  Patient to Follow up at: Follow-up Information    [facility] Go to.   Why:  Should you change your mind about receiving mentalh health services, please follow up at [facility] walk in clinic on Monday, Wednesday, Friday at 8am-3pm. Please bring photo ID, medication, insurance card. Thank you. Contact information: [address] [place] [address] [phone]           Next level of care provider has access to [facility] Link:no  Safety Planning and Suicide Prevention discussed: Yes,  with pt  Have you used any form of tobacco in the last 30 days? (Cigarettes, Smokeless Tobacco, Cigars, and/or Pipes): No  Has patient been referred to the Quitline?: N/A patient is not a smoker  Patient has been referred for addiction treatment: N/A  Suzan Slick, LCSW 11/06/2018, 11:20 AM

## 2018-11-06 NOTE — BHH Suicide Risk Assessment (Signed)
[facility]   Principal Problem: Delusional disorder [facility](HCC) Discharge Diagnoses: Principal Problem:   Delusional disorder (HCC) Active Problems:   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 layer exposed (HCC)   Total Time spent with patient: 20 minutes  Musculoskeletal: Strength & Muscle Tone: within normal limits Gait & Station: unsteady Patient leans: N/A  Psychiatric Specialty Exam: Review of Systems  Neurological: Negative.   Psychiatric/Behavioral: Negative.   All other systems reviewed and are negative.   Blood pressure 112/70, pulse 88, temperature 97.9 F (36.6 C), resp. rate 18, height 5\' 4"  (1.626 m), weight 86.2 kg, SpO2 100 %.Body mass index is 32.61 kg/m.  General Appearance: Casual  Eye Contact::  Good  Speech:  Clear and Coherent409  Volume:  Normal  Mood:  Euthymic  Affect:  Appropriate  Thought Process:  Goal Directed and Descriptions of Associations: Intact  Orientation:  Full (Time, Place, and Person)  Thought Content:  Delusions and Paranoid Ideation  Suicidal Thoughts:  No  Homicidal Thoughts:  No  Memory:  Immediate;   Fair Recent;   Fair Remote;   Fair  Judgement:  Poor  Insight:  Lacking  Psychomotor Activity:  Normal  Concentration:  Fair  Recall:  F[employer]ervFair  Fund of Knowledge:Fair  Language: Fair  Akathisia:  No  Handed:  Right  AIMS (if indicated):     Assets:  Communication Skills Desire for Improvement Financial Resources/Insurance Housing Resilience Transportation  Sleep:  Number of Hours: 5.5  Cognition: WNL  ADL's:  Intact   Mental Status Per Nursing Assessment::   On Admission:  Thoughts of violence towards others  Demographic Factors:  Living alone  Loss Factors: Decline in physical health  Historical Factors: Impulsivity  Risk Reduction Factors:   Sense of responsibility to family and Positive social support  Continued Clinical Symptoms:  Currently Psychotic  Cognitive Features That  Contribute To Risk:  None    Suicide Risk:  Minimal: No identifiable suicidal ideation.  Patients presenting with no risk factors but with morbid ruminations; may be classified as minimal risk based on the severity of the depressive symptoms    Plan Of Care/Follow-up recommendations:  Activity:  as tolerated Diet:  low sodium heart healthy ADA diet Other:  keep follow up appointments  Kristine LineaJolanta Larina Lieurance, MD 11/06/2018, 9:12 AM

## 2018-11-06 NOTE — BHH Counselor (Signed)
Pt reports she does not want to be referred to a provider for mental health services at this time. Pt says she will find her own provider should she change her mind.

## 2018-11-06 NOTE — Progress Notes (Signed)
Recreation Therapy Notes  Date: 11/06/2018  Time: 9:30 am   Location: Craft room   Behavioral response: N/A   Intervention Topic: Leisure  Discussion/Intervention: Patient did not attend group.   Clinical Observations/Feedback:  Patient did not attend group.   Kyannah Climer LRT/CTRS        Jackie Miles 11/06/2018 10:26 AM 

## 2018-11-06 NOTE — ED Triage Notes (Signed)
Pt reports was recently here and has a wound on her right foot. Pt states she is here today to have it wrapped. Pt states that they do not know how to wrap it where she is at and she hit it on the wheelchair and needs it looked at and wrapped again.

## 2018-11-06 NOTE — BHH Suicide Risk Assessment (Signed)
BHH INPATIENT:  Family/Significant Other Suicide Prevention Education  Suicide Prevention Education:  Contact Attempts Jackie Miles, son [phone] has been identified by the patient as the family member/significant other with whom the patient will be residing, and identified as the person(s) who will aid the patient in the event of a mental health crisis.  With written consent from the patient, two attempts were made to provide suicide prevention education, prior to and/or following the patient's discharge.  We were unsuccessful in providing suicide prevention education.  A suicide education pamphlet was given to the patient to share with family/significant other.  Date and time of first attempt: 11/06/18 11:13am Date and time of second attempt:  Jackie Miles Jackie Miles 11/06/2018, 11:13 AM

## 2018-11-19 ENCOUNTER — Other Ambulatory Visit: Payer: Self-pay

## 2018-11-19 ENCOUNTER — Emergency Department: Payer: Medicaid Other

## 2018-11-19 ENCOUNTER — Emergency Department
Admission: EM | Admit: 2018-11-19 | Discharge: 2018-11-19 | Disposition: A | Discharge status: Home / Self Care | Payer: Medicaid Other | Source: Home / Self Care | Attending: Emergency Medicine | Admitting: Emergency Medicine

## 2018-11-19 ENCOUNTER — Inpatient Hospital Stay
Admission: EM | Admit: 2018-11-19 | Discharge: 2018-11-20 | DRG: 300 | Discharge status: Against Medical Advice | Payer: Medicaid Other | Attending: Internal Medicine | Admitting: Internal Medicine

## 2018-11-19 ENCOUNTER — Encounter: Payer: Self-pay | Admitting: Emergency Medicine

## 2018-11-19 DIAGNOSIS — E11610 Type 2 diabetes mellitus with diabetic neuropathic arthropathy: Secondary | ICD-10-CM | POA: Diagnosis present

## 2018-11-19 DIAGNOSIS — E11621 Type 2 diabetes mellitus with foot ulcer: Secondary | ICD-10-CM | POA: Diagnosis present

## 2018-11-19 DIAGNOSIS — I10 Essential (primary) hypertension: Secondary | ICD-10-CM

## 2018-11-19 DIAGNOSIS — E119 Type 2 diabetes mellitus without complications: Secondary | ICD-10-CM

## 2018-11-19 DIAGNOSIS — Z7984 Long term (current) use of oral hypoglycemic drugs: Secondary | ICD-10-CM

## 2018-11-19 DIAGNOSIS — E871 Hypo-osmolality and hyponatremia: Secondary | ICD-10-CM | POA: Diagnosis present

## 2018-11-19 DIAGNOSIS — M797 Fibromyalgia: Secondary | ICD-10-CM | POA: Diagnosis present

## 2018-11-19 DIAGNOSIS — E1152 Type 2 diabetes mellitus with diabetic peripheral angiopathy with gangrene: Principal | ICD-10-CM | POA: Diagnosis present

## 2018-11-19 DIAGNOSIS — L97319 Non-pressure chronic ulcer of right ankle with unspecified severity: Secondary | ICD-10-CM | POA: Diagnosis present

## 2018-11-19 DIAGNOSIS — M545 Low back pain, unspecified: Secondary | ICD-10-CM

## 2018-11-19 DIAGNOSIS — N189 Chronic kidney disease, unspecified: Secondary | ICD-10-CM | POA: Diagnosis present

## 2018-11-19 DIAGNOSIS — T148XXA Other injury of unspecified body region, initial encounter: Secondary | ICD-10-CM

## 2018-11-19 DIAGNOSIS — E1140 Type 2 diabetes mellitus with diabetic neuropathy, unspecified: Secondary | ICD-10-CM | POA: Diagnosis present

## 2018-11-19 DIAGNOSIS — F17200 Nicotine dependence, unspecified, uncomplicated: Secondary | ICD-10-CM | POA: Insufficient documentation

## 2018-11-19 DIAGNOSIS — Z833 Family history of diabetes mellitus: Secondary | ICD-10-CM

## 2018-11-19 DIAGNOSIS — D649 Anemia, unspecified: Secondary | ICD-10-CM

## 2018-11-19 DIAGNOSIS — L97519 Non-pressure chronic ulcer of other part of right foot with unspecified severity: Secondary | ICD-10-CM | POA: Diagnosis present

## 2018-11-19 DIAGNOSIS — E1169 Type 2 diabetes mellitus with other specified complication: Secondary | ICD-10-CM | POA: Diagnosis present

## 2018-11-19 DIAGNOSIS — M869 Osteomyelitis, unspecified: Secondary | ICD-10-CM

## 2018-11-19 DIAGNOSIS — J45909 Unspecified asthma, uncomplicated: Secondary | ICD-10-CM | POA: Insufficient documentation

## 2018-11-19 DIAGNOSIS — Z79899 Other long term (current) drug therapy: Secondary | ICD-10-CM

## 2018-11-19 DIAGNOSIS — Z886 Allergy status to analgesic agent status: Secondary | ICD-10-CM

## 2018-11-19 DIAGNOSIS — E1122 Type 2 diabetes mellitus with diabetic chronic kidney disease: Secondary | ICD-10-CM | POA: Diagnosis present

## 2018-11-19 DIAGNOSIS — Z9114 Patient's other noncompliance with medication regimen: Secondary | ICD-10-CM

## 2018-11-19 DIAGNOSIS — E861 Hypovolemia: Secondary | ICD-10-CM | POA: Diagnosis present

## 2018-11-19 DIAGNOSIS — M009 Pyogenic arthritis, unspecified: Secondary | ICD-10-CM | POA: Diagnosis present

## 2018-11-19 DIAGNOSIS — F17210 Nicotine dependence, cigarettes, uncomplicated: Secondary | ICD-10-CM | POA: Diagnosis present

## 2018-11-19 DIAGNOSIS — A419 Sepsis, unspecified organism: Secondary | ICD-10-CM

## 2018-11-19 DIAGNOSIS — L089 Local infection of the skin and subcutaneous tissue, unspecified: Secondary | ICD-10-CM

## 2018-11-19 DIAGNOSIS — I129 Hypertensive chronic kidney disease with stage 1 through stage 4 chronic kidney disease, or unspecified chronic kidney disease: Secondary | ICD-10-CM | POA: Diagnosis present

## 2018-11-19 DIAGNOSIS — E785 Hyperlipidemia, unspecified: Secondary | ICD-10-CM | POA: Diagnosis present

## 2018-11-19 DIAGNOSIS — N179 Acute kidney failure, unspecified: Secondary | ICD-10-CM | POA: Diagnosis present

## 2018-11-19 DIAGNOSIS — D631 Anemia in chronic kidney disease: Secondary | ICD-10-CM | POA: Diagnosis present

## 2018-11-19 LAB — PROTIME-INR
INR: 1.23
Prothrombin Time: 15.4 seconds — ABNORMAL HIGH (ref 11.4–15.2)

## 2018-11-19 LAB — COMPREHENSIVE METABOLIC PANEL
ALT: 8 U/L (ref 0–44)
AST: 14 U/L — ABNORMAL LOW (ref 15–41)
Albumin: 2.3 g/dL — ABNORMAL LOW (ref 3.5–5.0)
Alkaline Phosphatase: 105 U/L (ref 38–126)
Anion gap: 7 (ref 5–15)
BUN: 38 mg/dL — ABNORMAL HIGH (ref 6–20)
CO2: 17 mmol/L — ABNORMAL LOW (ref 22–32)
CREATININE: 1.66 mg/dL — AB (ref 0.44–1.00)
Calcium: 8.4 mg/dL — ABNORMAL LOW (ref 8.9–10.3)
Chloride: 107 mmol/L (ref 98–111)
GFR calc Af Amer: 40 mL/min — ABNORMAL LOW (ref >60)
GFR calc non Af Amer: 35 mL/min — ABNORMAL LOW (ref >60)
Glucose, Bld: 182 mg/dL — ABNORMAL HIGH (ref 70–99)
Potassium: 4.3 mmol/L (ref 3.5–5.1)
Sodium: 131 mmol/L — ABNORMAL LOW (ref 135–145)
Total Bilirubin: 0.6 mg/dL (ref 0.3–1.2)
Total Protein: 6.5 g/dL (ref 6.5–8.1)

## 2018-11-19 LAB — CBC WITH DIFFERENTIAL/PLATELET
Abs Immature Granulocytes: 0.15 10*3/uL — ABNORMAL HIGH (ref 0.00–0.07)
Basophils Absolute: 0 10*3/uL (ref 0.0–0.1)
Basophils Relative: 0 %
Eosinophils Absolute: 0 10*3/uL (ref 0.0–0.5)
Eosinophils Relative: 0 %
HCT: 19.8 % — ABNORMAL LOW (ref 36.0–46.0)
HEMOGLOBIN: 6.3 g/dL — AB (ref 12.0–15.0)
Immature Granulocytes: 1 %
LYMPHS PCT: 10 %
Lymphs Abs: 1.4 10*3/uL (ref 0.7–4.0)
MCH: 24.5 pg — ABNORMAL LOW (ref 26.0–34.0)
MCHC: 31.8 g/dL (ref 30.0–36.0)
MCV: 77 fL — AB (ref 80.0–100.0)
Monocytes Absolute: 1.5 10*3/uL — ABNORMAL HIGH (ref 0.1–1.0)
Monocytes Relative: 11 %
Neutro Abs: 10.3 10*3/uL — ABNORMAL HIGH (ref 1.7–7.7)
Neutrophils Relative %: 78 %
Platelets: 404 10*3/uL — ABNORMAL HIGH (ref 150–400)
RBC: 2.57 MIL/uL — ABNORMAL LOW (ref 3.87–5.11)
RDW: 17.5 % — ABNORMAL HIGH (ref 11.5–15.5)
WBC: 13.3 10*3/uL — ABNORMAL HIGH (ref 4.0–10.5)
nRBC: 0 % (ref 0.0–0.2)

## 2018-11-19 LAB — LACTIC ACID, PLASMA: Lactic Acid, Venous: 1.5 mmol/L (ref 0.5–1.9)

## 2018-11-19 LAB — LIPASE, BLOOD: Lipase: 36 U/L (ref 11–51)

## 2018-11-19 MED ORDER — VANCOMYCIN HCL IN DEXTROSE 1-5 GM/200ML-% IV SOLN
1000.0000 mg | Freq: Once | INTRAVENOUS | Status: AC
  Administered 2018-11-19: 1000 mg via INTRAVENOUS
  Filled 2018-11-19: qty 200

## 2018-11-19 MED ORDER — SODIUM CHLORIDE 0.9 % IV BOLUS (SEPSIS)
1000.0000 mL | Freq: Once | INTRAVENOUS | Status: AC
  Administered 2018-11-19: 1000 mL via INTRAVENOUS

## 2018-11-19 MED ORDER — VANCOMYCIN HCL IN DEXTROSE 1-5 GM/200ML-% IV SOLN
1000.0000 mg | INTRAVENOUS | Status: DC
  Filled 2018-11-19: qty 200

## 2018-11-19 MED ORDER — OXYCODONE-ACETAMINOPHEN 5-325 MG PO TABS: 2.0000 | ORAL_TABLET | Freq: Once | ORAL | Status: DC

## 2018-11-19 MED ORDER — PIPERACILLIN-TAZOBACTAM 3.375 G IVPB 30 MIN
3.3750 g | Freq: Once | INTRAVENOUS | Status: AC
  Administered 2018-11-19: 3.375 g via INTRAVENOUS
  Filled 2018-11-19: qty 50

## 2018-11-19 MED ORDER — SODIUM CHLORIDE 0.9% FLUSH
3.0000 mL | Freq: Once | INTRAVENOUS | Status: AC
  Administered 2018-11-19: 3 mL via INTRAVENOUS

## 2018-11-19 MED ORDER — MUPIROCIN 2 % EX OINT
TOPICAL_OINTMENT | CUTANEOUS | Status: DC
Start: 2018-11-17 — End: 2018-11-19

## 2018-11-19 NOTE — ED Provider Notes (Signed)
[facility] Emergency Department Provider Note   ____________________________________________   First MD Initiated Contact with Patient 11/19/18 1440     (approximate)  I have reviewed the triage vital signs and the nursing notes.   HISTORY  Chief Complaint Back Pain    HPI Jackie Miles is a 53 y.o. female patient arrived via EMS complain of back pain secondary to falling off a chair 5 days ago.  Patient was seen at [facility] for this complaint but left AGAINST MEDICAL ADVICE.  Patient has referral to PT but they would not see until she had an lumbar spine.  Patient is also on antibiotics for her right foot and she is adamant she does not want to be seen for anything else except the lumbar spine x-ray at this time.   I was able to talk the patient to have a dressing change and reclean the foot.   Past Medical History:  Diagnosis Date  . Asthma   . Diabetes mellitus without complication (HCC)   . Fibromyalgia   . H/O percutaneous left heart catheterization   . HLD (hyperlipidemia)   . Hypertension   . Shingles     Patient Active Problem List   Diagnosis Date Noted  . Tobacco use disorder 11/06/2018  . Delusional disorder (HCC) 11/04/2018  . Cellulitis 09/07/2017  . HTN (hypertension) 05/28/2017  . Asthma 05/28/2017  . HLD (hyperlipidemia) 05/28/2017  . AKI (acute kidney injury) (HCC) 05/28/2017  . 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 Cellulitis of foot, right   . Pressure injury of skin 02/14/2017  . Osteomyelitis (HCC) 02/21/2016  . Foot infection 01/22/2016  . Sepsis (HCC) 01/12/2016    Past Surgical History:  Procedure Laterality Date  . toe amputation      Prior to Admission medications   Medication Sig Start Date End Date Taking? Authorizing Provider  amitriptyline (ELAVIL) 100 MG tablet Take 1 tablet (100 mg total) by mouth at bedtime. 11/06/18   Pucilowska, Braulio Conte B, MD  gabapentin (NEURONTIN) 600 MG tablet  Take 1 tablet (600 mg total) by mouth 2 (two) times daily. 11/06/18   Pucilowska, Braulio Conte B, MD  metFORMIN (GLUCOPHAGE) 1000 MG tablet Take 1 tablet (1,000 mg total) by mouth 2 (two) times daily with a meal. 11/06/18   Pucilowska, Jolanta B, MD  mupirocin cream (BACTROBAN) 2 % Apply topically daily. 11/07/18   Pucilowska, Ellin Goodie, MD    Allergies Ibuprofen  Family History  Problem Relation Age of Onset  . Diabetes Mother   . Diabetes Sister     Social History Social History   Tobacco Use  . Smoking status: Current Every Day Smoker    Packs/day: 0.50  . Smokeless tobacco: Never Used  Substance Use Topics  . Alcohol use: No  . Drug use: No    Review of Systems Constitutional: No fever/chills Eyes: No visual changes. ENT: No sore throat. Cardiovascular: Denies chest pain. Respiratory: Denies shortness of breath. Gastrointestinal: No abdominal pain.  No nausea, no vomiting.  No diarrhea.  No constipation. Genitourinary: Negative for dysuria. Musculoskeletal: Positive for back pain. Skin: Negative for rash. Neurological: Negative for headaches, focal weakness or numbness. Psychiatric:  Delusional disorder Endocrine:  Diabetes, edema and hypertension. Allergic/Immunilogical: Ibuprofen ____________________________________________   PHYSICAL EXAM:  VITAL SIGNS: ED Triage Vitals  Enc Vitals Group     BP 11/19/18 1406 (!) 97/45     Pulse Rate 11/19/18 1406 (!) 103  Resp 11/19/18 1406 16     Temp 11/19/18 1406 99.9 F (37.7 C)     Temp Source 11/19/18 1406 Oral     SpO2 11/19/18 1406 100 %     Weight 11/19/18 1407 190 lb (86.2 kg)     Height 11/19/18 1407 5\' 8"  (1.727 m)     Head Circumference --      Peak Flow --      Pain Score 11/19/18 1407 10     Pain Loc --      Pain Edu? --      Excl. in GC? --    Constitutional: Alert and oriented. Well appearing and in no acute distress. Neck: No stridor.   Cardiovascular: Normal rate, regular rhythm. Grossly normal heart  sounds.  Good peripheral circulation. Respiratory: Normal respiratory effort.  No retractions. Lungs CTAB.  Gastrointestinal: Soft and nontender. No distention. No abdominal bruits. No CVA tenderness. Musculoskeletal: Patient has foul-smelling left foot full dressing.   Neurologic:  Normal speech and language. No gross focal neurologic deficits are appreciated. No gait instability. Skin:  Skin is warm, dry and intact. No rash noted. Psychiatric: Mood and affect are normal. Speech and behavior are normal.  ____________________________________________   LABS (all labs ordered are listed, but only abnormal results are displayed)  Labs Reviewed - No data to display ____________________________________________  EKG   ____________________________________________  RADIOLOGY  ED MD interpretation:    Official radiology report(s): Dg Lumbar Spine 2-3 Views  Result Date: 11/19/2018 CLINICAL DATA:  53 year old female with a history lumbar back pain EXAM: LUMBAR SPINE - 2-3 VIEW COMPARISON:  None. FINDINGS: Lumbar Spine: Lumbar vertebral elements maintain normal alignment without evidence of anterolisthesis, retrolisthesis, subluxation. No acute fracture line identified. Vertebral body heights maintained. Mild disc space narrowing with early endplate changes. No significant facet hypertrophy Calcifications of the vasculature IMPRESSION: Negative for acute fracture or malalignment of the lumbar spine. Electronically Signed   By: Gilmer Mor D.O.   On: 11/19/2018 15:12    ____________________________________________   PROCEDURES  Procedure(s) performed: None  Procedures  Critical Care performed: No  ____________________________________________   INITIAL IMPRESSION / ASSESSMENT AND PLAN / ED COURSE  As part of my medical decision making, I reviewed the following data within the electronic MEDICAL RECORD NUMBER     Patient presents with back pain secondary to fall and requires an x-ray  before she can start physical therapy.  Chief concern is about her left foot which has an ulcer with infection.  Patient was seen 2 days ago at [facility] it is again noted that she is noncompliant with her antibiotics.  Was able to convince the patient to have put very clean and sterile dressing applied.  Discussed negative x-ray findings with patient.  Patient given discharge care instruction advised follow-up with physical therapy.     ____________________________________________   FINAL CLINICAL IMPRESSION(S) / ED DIAGNOSES  Final diagnoses:  Acute midline low back pain without sciatica     ED Discharge Orders    None       Note:  This document was prepared using Dragon voice recognition software and may include unintentional dictation errors.    Joni Reining, PA-C 11/19/18 1529    Jeanmarie Plant, MD 11/23/18 [phone]

## 2018-11-19 NOTE — Progress Notes (Addendum)
Pharmacy Antibiotic Note  Jackie Miles is a 53 y.o. female admitted on 11/19/2018 with cellulitis.  Pharmacy has been consulted for vancomycin dosing.  Plan: DW 86kg  VD 56L kei 0.037 hr-1  T1/2 19 hours  Vancomycin 1000 mg IV Q 24 hrs. Goal AUC 400-550. Expected AUC: 486 SCr used: 1.66   Height: 5\' 8"  (172.7 cm) Weight: 189 lb 13.1 oz (86.1 kg) IBW/kg (Calculated) : 63.9  Temp (24hrs), Avg:101.6 F (38.7 C), Min:99.9 F (37.7 C), Max:103.3 F (39.6 C)  Recent Labs  Lab 11/19/18 2300  WBC 13.3*  CREATININE 1.66*  LATICACIDVEN 1.5    Estimated Creatinine Clearance: 44.5 mL/min (A) (by C-G formula based on SCr of 1.66 mg/dL (H)).    Allergies  Allergen Reactions  . Ibuprofen Other (See Comments)    Reaction:  Acid reflux     Antimicrobials this admission: Vancomycin,  Zosyn x1 2/20  >>    >>   Dose adjustments this admission:   Microbiology results: 2/20 BCx: pending 2/50 WoundCx: pending     Thank you for allowing pharmacy to be a part of this patient's care.  Corabelle Spackman S 11/19/2018 11:59 PM

## 2018-11-19 NOTE — ED Notes (Signed)
Pt refused to give urine sample

## 2018-11-19 NOTE — ED Notes (Signed)
Patient states, "I'm here to get x-rays on my spine so I can get my physical therapy."  Patient presents with mutliple bags and multiple layers of clothing.  Patient states she is currently living with her brother.  Patient states, "I didn't take my antibiotics this morning."  This RN asked patient what she takes antibiotics for.  She states, "so I don't have a coma like I did last time."  Patient requested "hot meal" since she was going to, "be here awhile."  This RN informed patient that the goal is she would be discharged quickly and offered a [place] sandwich.  Patient refused.

## 2018-11-19 NOTE — ED Triage Notes (Signed)
Pt to ED via EMS from home. Pt was d/c from hospital prior today for sepsis. Pt has gangrenous right foot with leakage. Pt is a&o x4 and refusing to take off sweater on arrival as well as demanding blankets although she has been told bc her temp is 103.3 she should not have them. NAD.

## 2018-11-19 NOTE — ED Notes (Signed)
Pt's wound cleaned and dressed.

## 2018-11-19 NOTE — Discharge Instructions (Addendum)
Your lumbar spine shows no abnormalities.  Follow-up with physical therapy referral.

## 2018-11-19 NOTE — ED Notes (Signed)
First nurse note: pt here via EMS requesting xray for her back pain and PT. Refused care at [facility] yesterday. NAD.

## 2018-11-19 NOTE — ED Triage Notes (Signed)
Pt here via EMS with c/o back pain from a falling out of her chair 5 days ago, states she doesn't remember the fall. Says she is wanting an xray of her back so she can get PT. Is on antibiotic for her right foot currently, however, not needing to be seen for her foot. NAD.

## 2018-11-20 DIAGNOSIS — E785 Hyperlipidemia, unspecified: Secondary | ICD-10-CM | POA: Diagnosis present

## 2018-11-20 DIAGNOSIS — D631 Anemia in chronic kidney disease: Secondary | ICD-10-CM | POA: Diagnosis present

## 2018-11-20 DIAGNOSIS — Z833 Family history of diabetes mellitus: Secondary | ICD-10-CM | POA: Diagnosis not present

## 2018-11-20 DIAGNOSIS — M797 Fibromyalgia: Secondary | ICD-10-CM | POA: Diagnosis present

## 2018-11-20 DIAGNOSIS — I129 Hypertensive chronic kidney disease with stage 1 through stage 4 chronic kidney disease, or unspecified chronic kidney disease: Secondary | ICD-10-CM | POA: Diagnosis present

## 2018-11-20 DIAGNOSIS — Z886 Allergy status to analgesic agent status: Secondary | ICD-10-CM | POA: Diagnosis not present

## 2018-11-20 DIAGNOSIS — L089 Local infection of the skin and subcutaneous tissue, unspecified: Secondary | ICD-10-CM | POA: Diagnosis present

## 2018-11-20 DIAGNOSIS — N189 Chronic kidney disease, unspecified: Secondary | ICD-10-CM | POA: Diagnosis present

## 2018-11-20 DIAGNOSIS — N179 Acute kidney failure, unspecified: Secondary | ICD-10-CM | POA: Diagnosis present

## 2018-11-20 DIAGNOSIS — F17210 Nicotine dependence, cigarettes, uncomplicated: Secondary | ICD-10-CM | POA: Diagnosis present

## 2018-11-20 DIAGNOSIS — L97319 Non-pressure chronic ulcer of right ankle with unspecified severity: Secondary | ICD-10-CM | POA: Diagnosis present

## 2018-11-20 DIAGNOSIS — J45909 Unspecified asthma, uncomplicated: Secondary | ICD-10-CM | POA: Diagnosis present

## 2018-11-20 DIAGNOSIS — E871 Hypo-osmolality and hyponatremia: Secondary | ICD-10-CM | POA: Diagnosis present

## 2018-11-20 DIAGNOSIS — M545 Low back pain: Secondary | ICD-10-CM | POA: Diagnosis not present

## 2018-11-20 DIAGNOSIS — L97519 Non-pressure chronic ulcer of other part of right foot with unspecified severity: Secondary | ICD-10-CM | POA: Diagnosis present

## 2018-11-20 DIAGNOSIS — E11621 Type 2 diabetes mellitus with foot ulcer: Secondary | ICD-10-CM | POA: Diagnosis present

## 2018-11-20 DIAGNOSIS — E1169 Type 2 diabetes mellitus with other specified complication: Secondary | ICD-10-CM | POA: Diagnosis present

## 2018-11-20 DIAGNOSIS — E1140 Type 2 diabetes mellitus with diabetic neuropathy, unspecified: Secondary | ICD-10-CM | POA: Diagnosis present

## 2018-11-20 DIAGNOSIS — Z7984 Long term (current) use of oral hypoglycemic drugs: Secondary | ICD-10-CM | POA: Diagnosis not present

## 2018-11-20 DIAGNOSIS — Z9114 Patient's other noncompliance with medication regimen: Secondary | ICD-10-CM | POA: Diagnosis not present

## 2018-11-20 DIAGNOSIS — E11610 Type 2 diabetes mellitus with diabetic neuropathic arthropathy: Secondary | ICD-10-CM | POA: Diagnosis present

## 2018-11-20 DIAGNOSIS — E1122 Type 2 diabetes mellitus with diabetic chronic kidney disease: Secondary | ICD-10-CM | POA: Diagnosis present

## 2018-11-20 DIAGNOSIS — E1152 Type 2 diabetes mellitus with diabetic peripheral angiopathy with gangrene: Secondary | ICD-10-CM | POA: Diagnosis not present

## 2018-11-20 DIAGNOSIS — E861 Hypovolemia: Secondary | ICD-10-CM | POA: Diagnosis present

## 2018-11-20 DIAGNOSIS — M869 Osteomyelitis, unspecified: Secondary | ICD-10-CM | POA: Diagnosis present

## 2018-11-20 DIAGNOSIS — M009 Pyogenic arthritis, unspecified: Secondary | ICD-10-CM | POA: Diagnosis present

## 2018-11-20 LAB — PROCALCITONIN: Procalcitonin: 15.32 ng/mL

## 2018-11-20 LAB — URINALYSIS, COMPLETE (UACMP) WITH MICROSCOPIC
Bacteria, UA: NONE SEEN
Bilirubin Urine: NEGATIVE
Glucose, UA: NEGATIVE mg/dL
Ketones, ur: NEGATIVE mg/dL
Nitrite: NEGATIVE
Protein, ur: 100 mg/dL — AB
Specific Gravity, Urine: 1.015 (ref 1.005–1.030)
pH: 5 (ref 5.0–8.0)

## 2018-11-20 LAB — TYPE AND SCREEN
ABO/RH(D): A POS
Antibody Screen: NEGATIVE

## 2018-11-20 LAB — BRAIN NATRIURETIC PEPTIDE: B NATRIURETIC PEPTIDE 5: 247 pg/mL — AB (ref 0.0–100.0)

## 2018-11-20 MED ORDER — ONDANSETRON HCL 4 MG PO TABS: 4.0000 mg | ORAL_TABLET | Freq: Four times a day (QID) | ORAL | Status: DC | PRN

## 2018-11-20 MED ORDER — PIPERACILLIN-TAZOBACTAM 3.375 G IVPB: 3.3750 g | Freq: Three times a day (TID) | INTRAVENOUS | Status: DC

## 2018-11-20 MED ORDER — ONDANSETRON HCL 4 MG/2ML IJ SOLN: 4.0000 mg | Freq: Four times a day (QID) | INTRAMUSCULAR | Status: DC | PRN

## 2018-11-20 MED ORDER — CLINDAMYCIN PHOSPHATE 900 MG/50ML IV SOLN
900.0000 mg | Freq: Once | INTRAVENOUS | Status: AC
  Administered 2018-11-20: 900 mg via INTRAVENOUS
  Filled 2018-11-20: qty 50

## 2018-11-20 MED ORDER — GABAPENTIN 600 MG PO TABS
600.0000 mg | ORAL_TABLET | Freq: Two times a day (BID) | ORAL | Status: DC
  Administered 2018-11-20: 600 mg via ORAL
  Filled 2018-11-20: qty 1

## 2018-11-20 MED ORDER — VANCOMYCIN HCL IN DEXTROSE 1-5 GM/200ML-% IV SOLN
1000.0000 mg | INTRAVENOUS | Status: DC
  Administered 2018-11-20: 1000 mg via INTRAVENOUS

## 2018-11-20 MED ORDER — DOCUSATE SODIUM 100 MG PO CAPS
100.0000 mg | ORAL_CAPSULE | Freq: Two times a day (BID) | ORAL | Status: DC
  Administered 2018-11-20: 100 mg via ORAL
  Filled 2018-11-20: qty 1

## 2018-11-20 MED ORDER — CLINDAMYCIN PHOSPHATE 600 MG/50ML IV SOLN
600.0000 mg | Freq: Three times a day (TID) | INTRAVENOUS | Status: DC
  Filled 2018-11-20 (×2): qty 50

## 2018-11-20 NOTE — Consult Note (Signed)
ORTHOPAEDIC CONSULTATION  REQUESTING PHYSICIAN: Enedina Finner, MD  Chief Complaint: Right ankle ulceration  HPI: Jackie Miles is a 53 y.o. female who complains of nonhealing right ankle and foot.  Patient states she is dealt with this right ankle and foot for at least 3 years now.  She states she has been seen at [facility] wound care.  She states more recently she has been seen by a specialist in [place].  They have discussed reconstruction.  He instructed her to stop smoking which she states she has done.  She states approximately 3 weeks ago a piece of bone" fell out" of her ankle and her wound healed on the distal aspect of her ankle.  She states she keeps hitting it on the wheelchair and this causes the ulceration to open up.  He states she was previously ambulatory but at this point she pushes herself around in a wheelchair quite often.  Past Medical History:  Diagnosis Date  . Asthma   . Diabetes mellitus without complication (HCC)   . Fibromyalgia   . H/O percutaneous left heart catheterization   . HLD (hyperlipidemia)   . Hypertension   . Shingles    Past Surgical History:  Procedure Laterality Date  . toe amputation     Social History   Socioeconomic History  . Marital status: Single    Spouse name: Not on file  . Number of children: Not on file  . Years of education: Not on file  . Highest education level: Not on file  Occupational History  . Not on file  Social Needs  . Financial resource strain: Not on file  . Food insecurity:    Worry: Not on file    Inability: Not on file  . Transportation needs:    Medical: Not on file    Non-medical: Not on file  Tobacco Use  . Smoking status: Current Every Day Smoker    Packs/day: 0.50  . Smokeless tobacco: Never Used  Substance and Sexual Activity  . Alcohol use: No  . Drug use: No  . Sexual activity: Not on file  Lifestyle  . Physical activity:    Days per week: Not on file    Minutes per session: Not on file   . Stress: Not on file  Relationships  . Social connections:    Talks on phone: Not on file    Gets together: Not on file    Attends religious service: Not on file    Active member of club or organization: Not on file    Attends meetings of clubs or organizations: Not on file    Relationship status: Not on file  Other Topics Concern  . Not on file  Social History Narrative  . Not on file   Family History  Problem Relation Age of Onset  . Diabetes Mother   . Diabetes Sister    Allergies  Allergen Reactions  . Ibuprofen Other (See Comments)    Reaction:  Acid reflux, GI bleeding   Prior to Admission medications   Medication Sig Start Date End Date Taking? Authorizing Provider  amitriptyline (ELAVIL) 100 MG tablet Take 1 tablet (100 mg total) by mouth at bedtime. 11/06/18   Pucilowska, Braulio Conte B, MD  gabapentin (NEURONTIN) 600 MG tablet Take 1 tablet (600 mg total) by mouth 2 (two) times daily. 11/06/18   Pucilowska, Braulio Conte B, MD  metFORMIN (GLUCOPHAGE) 1000 MG tablet Take 1 tablet (1,000 mg total) by mouth 2 (two) times daily with a  meal. 11/06/18   Pucilowska, Jolanta B, MD  mupirocin cream (BACTROBAN) 2 % Apply topically daily. 11/07/18   Shari ProwsPucilowska, Jolanta B, MD   Dg Lumbar Spine 2-3 Views  Result Date: 11/19/2018 CLINICAL DATA:  53 year old female with a history lumbar back pain EXAM: LUMBAR SPINE - 2-3 VIEW COMPARISON:  None. FINDINGS: Lumbar Spine: Lumbar vertebral elements maintain normal alignment without evidence of anterolisthesis, retrolisthesis, subluxation. No acute fracture line identified. Vertebral body heights maintained. Mild disc space narrowing with early endplate changes. No significant facet hypertrophy Calcifications of the vasculature IMPRESSION: Negative for acute fracture or malalignment of the lumbar spine. Electronically Signed   By: Gilmer MorJaime  Wagner D.O.   On: 11/19/2018 15:12   Dg Ankle Complete Right  Result Date: 11/20/2018 CLINICAL DATA:  53 year old female  with deep soft tissue wound. EXAM: RIGHT ANKLE - COMPLETE 3+ VIEW COMPARISON:  04/29/2018 and earlier. FINDINGS: Chronic severely abnormal distal tib fib, ankle and visible foot. As in 2019, suspect a combination of chronic neuropathic joint and prior osteomyelitis. There is a superimposed deep lateral soft tissue wound with abnormal soft tissue gas tracking to the periosteum of the abnormal distal fibula. There is also abnormal soft tissue gas at the medial ankle. There has been cortical osteolysis of the distal fibula since July. IMPRESSION: 1. Chronic severely abnormal distal tib-fib, ankle and foot. 2. Abnormal soft tissue gas medially and laterally, highly suspicious for GAS-FORMING INFECTION. 3. Evidence of Progressive Osteomyelitis at the lateral malleolus since July. Electronically Signed   By: Odessa FlemingH  Hall M.D.   On: 11/20/2018 00:06   Dg Chest Port 1 View  Result Date: 11/19/2018 CLINICAL DATA:  Sepsis EXAM: PORTABLE CHEST 1 VIEW COMPARISON:  04/29/2018 FINDINGS: Mild cardiomegaly with central congestion. No focal consolidation or effusion. No pneumothorax. Aortic atherosclerosis. IMPRESSION: Borderline to mild cardiomegaly with central vascular congestion. No focal pulmonary infiltrate. Electronically Signed   By: Jasmine PangKim  Fujinaga M.D.   On: 11/19/2018 23:22    Positive ROS: All other systems have been reviewed and were otherwise negative with the exception of those mentioned in the HPI and as above.  12 point ROS was performed.  Physical Exam: General: Alert and oriented.  No apparent distress.  Vascular:  Left foot: Not evaluated  Right foot: Dorsalis Pedis:  absent Posterior Tibial:  absent.  Patient has severe edema to the right lower extremity likely preventing palpation of pulses.  Neuro:absent protective sensation  Derm: Lateral ankle has a noted ulceration with mild amount of purulent and bloody drainage.  Foul odor is noted from the wound itself.  The ulceration probes deep  approximately 4 cm directly into the ankle joint region.  She has diffuse brawny edema with lymphedema to the skin.  Small ulceration to the plantar midfoot is noted as well.  Ortho/MS: Severe instability of the right ankle is noted today.  Severe diffuse edema from the ankle distally to the foot.  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  X-rays: Right ankle and foot x-rays revealed severe destructive changes with complete collapse of the talus and destruction of the talus.Severe midfoot destruction noted as well.  Charcot midfoot and Charcot ankle is noted.  Rocker-bottom deformity seen on the lateral view.  Patient is status post fourth and fifth ray partial amputations.  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  Patient has a severe destructive ankle and foot with complete collapse of the midtarsal joint and loss of talus.  Patient needs to undergo amputation of the right lower extremity as likely will continue to become more septic in nature.  I had a discussion with the patient in regards to this and she completely refused any discussion in regards to amputation.  I discussed we could consult vascular surgery for amputation and she refused.  She states she would not want any procedures performed by anyone here at the hospital.  She is requesting to return home with antibiotics and she would like to follow-up with the specialist in [place].  Deep wound culture of the ankle.  Bandage was applied.  We will order dressing changes for the ankle.  At this time patient is refusing any further assistance and will sign off for now.    Irean Hong, DPM Cell [phone]   11/20/2018 8:56 AM

## 2018-11-20 NOTE — ED Notes (Signed)
Pt stating she doesn't want medication at this time. Pt states they doctor told her she doesn't have to have it yet. Pt advised that we need to hang medication. Pt states she will let me know if she is staying or going to leave the hospital. Pharmacy aware.

## 2018-11-20 NOTE — ED Provider Notes (Signed)
[facility] Emergency Department Provider Note  ____________________________________________   First MD Initiated Contact with Patient 11/19/18 2314     (approximate)  I have reviewed the triage vital signs and the nursing notes.   HISTORY  Chief Complaint Code Sepsis  Level 5 caveat:  history/ROS limited by acute/critical illness and the patient is a vague historian and minimally cooperative.  HPI Jackie Miles is a 53 y.o. female with medical history as listed below as well as well-documented medication noncompliance in the past and homelessness.  She presents by EMS for evaluation of right foot pain and a wound on the outside of her right ankle.  She claims that the wound is only been open for 3 days but it appears to be a much longer term wound than that.  There is an extremely foul smell.  [facility] and apparently left AGAINST MEDICAL ADVICE.  She was seen here earlier today for back pain and was noted to be noncompliant with her antibiotics for the foot ulcer.  She finally agreed to allow a dressing change but apparently has again not filled her antibiotics.  She is febrile and tachycardic upon arrival and was made a sepsis alert.  She will not cooperate with the history.  She reports fever, reports that her foot hurts and demands pain medicine, refuses to take off her sweater because she says she is cold in spite of the fever, denies shortness of breath and chest pain and abdominal pain, and refuses to answer anymore questions.  She says "I know how this goes" but will not participate in the exam beyond the bare necessities.  Past Medical History:  Diagnosis Date  . Asthma   . Diabetes mellitus without complication (HCC)   . Fibromyalgia   . H/O percutaneous left heart catheterization   . HLD (hyperlipidemia)   . Hypertension   . Shingles     Patient Active Problem List   Diagnosis Date Noted  . Tobacco use disorder  11/06/2018  . Delusional disorder (HCC) 11/04/2018  . Cellulitis 09/07/2017  . HTN (hypertension) 05/28/2017  . Asthma 05/28/2017  . HLD (hyperlipidemia) 05/28/2017  . AKI (acute kidney injury) (HCC) 05/28/2017  . 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 Cellulitis of foot, right   . Pressure injury of skin 02/14/2017  . Osteomyelitis (HCC) 02/21/2016  . Foot infection 01/22/2016  . Sepsis (HCC) 01/12/2016    Past Surgical History:  Procedure Laterality Date  . toe amputation      Prior to Admission medications   Medication Sig Start Date End Date Taking? Authorizing Provider  amitriptyline (ELAVIL) 100 MG tablet Take 1 tablet (100 mg total) by mouth at bedtime. 11/06/18   Pucilowska, Braulio ConteJolanta B, MD  gabapentin (NEURONTIN) 600 MG tablet Take 1 tablet (600 mg total) by mouth 2 (two) times daily. 11/06/18   Pucilowska, Braulio ConteJolanta B, MD  metFORMIN (GLUCOPHAGE) 1000 MG tablet Take 1 tablet (1,000 mg total) by mouth 2 (two) times daily with a meal. 11/06/18   Pucilowska, Jolanta B, MD  mupirocin cream (BACTROBAN) 2 % Apply topically daily. 11/07/18   Pucilowska, Ellin GoodieJolanta B, MD    Allergies Ibuprofen  Family History  Problem Relation Age of Onset  . Diabetes Mother   . Diabetes Sister     Social History Social History   Tobacco Use  . Smoking status: Current Every Day Smoker    Packs/day: 0.50  .  Smokeless tobacco: Never Used  Substance Use Topics  . Alcohol use: No  . Drug use: No    Review of Systems Level 5 caveat:  history/ROS limited by acute/critical illness and the patient is a vague historian and minimally cooperative.  See above HPI for details. ____________________________________________   PHYSICAL EXAM:  VITAL SIGNS: ED Triage Vitals  Enc Vitals Group     BP 11/19/18 2253 (!) 116/53     Pulse Rate 11/19/18 2253 (!) 117     Resp 11/19/18 2253 (!) 22     Temp 11/19/18 2257 (!) 103.3 F (39.6 C)     Temp Source 11/19/18 2253 Rectal     SpO2  11/19/18 2253 97 %     Weight 11/19/18 2250 86.1 kg (189 lb 13.1 oz)     Height 11/19/18 2250 1.727 m (5\' 8" )     Head Circumference --      Peak Flow --      Pain Score 11/19/18 2255 9     Pain Loc --      Pain Edu? --      Excl. in GC? --     Constitutional: Alert and oriented.  No acute distress. Eyes: Conjunctivae are normal.  Head: Atraumatic. Nose: No congestion/rhinnorhea. Mouth/Throat: Mucous membranes are moist. Neck: No stridor.  No meningeal signs.   Cardiovascular: Tachycardia, regular rhythm. Good peripheral circulation. Grossly normal heart sounds. Respiratory: Normal respiratory effort.  No retractions. Lungs CTAB. Gastrointestinal: Soft and nontender. No distention.  Musculoskeletal: Grossly abnormal appearance to the right foot and ankle consistent with Charcot foot with acute infection.  There is no crepitus or erythema to suggest spreading cellulitis.  There is an open and purulent wound on the right lateral malleolus with a very foul smell and purulent drainage.  The patient reports tenderness to palpation all throughout the foot and ankle.  There is no evidence of infection proximal to the ankle. Neurologic:  Normal speech and language. No gross focal neurologic deficits are appreciated.  Skin:  Skin is warm, dry and intact except for an open and purulent right lateral malleolus ulcer that appears to be acutely infected but a chronic wound. Psychiatric: Mood and affect are flat and blunted but somewhat verbally hostile without being overtly aggressive.  Minimally cooperative.  ____________________________________________   LABS (all labs ordered are listed, but only abnormal results are displayed)  Labs Reviewed  COMPREHENSIVE METABOLIC PANEL - Abnormal; Notable for the following components:      Result Value   Sodium 131 (*)    CO2 17 (*)    Glucose, Bld 182 (*)    BUN 38 (*)    Creatinine, Ser 1.66 (*)    Calcium 8.4 (*)    Albumin 2.3 (*)    AST 14 (*)     GFR calc non Af Amer 35 (*)    GFR calc Af Amer 40 (*)    All other components within normal limits  CBC WITH DIFFERENTIAL/PLATELET - Abnormal; Notable for the following components:   WBC 13.3 (*)    RBC 2.57 (*)    Hemoglobin 6.3 (*)    HCT 19.8 (*)    MCV 77.0 (*)    MCH 24.5 (*)    RDW 17.5 (*)    Platelets 404 (*)    Neutro Abs 10.3 (*)    Monocytes Absolute 1.5 (*)    Abs Immature Granulocytes 0.15 (*)    All other components within normal limits  PROTIME-INR -  Abnormal; Notable for the following components:   Prothrombin Time 15.4 (*)    All other components within normal limits  BRAIN NATRIURETIC PEPTIDE - Abnormal; Notable for the following components:   B Natriuretic Peptide 247.0 (*)    All other components within normal limits  CULTURE, BLOOD (ROUTINE X 2)  CULTURE, BLOOD (ROUTINE X 2)  URINE CULTURE  AEROBIC/ANAEROBIC CULTURE (SURGICAL/DEEP WOUND)  LACTIC ACID, PLASMA  PROCALCITONIN  LIPASE, BLOOD  LACTIC ACID, PLASMA  URINALYSIS, COMPLETE (UACMP) WITH MICROSCOPIC  URINALYSIS, ROUTINE W REFLEX MICROSCOPIC  POC URINE PREG, ED  TYPE AND SCREEN   ____________________________________________  EKG  None - EKG not ordered by ED physician ____________________________________________  RADIOLOGY Marylou Mccoy, personally viewed and evaluated these images (plain radiographs) as part of my medical decision making, as well as reviewing the written report by the radiologist.  ED MD interpretation: Severely abnormal right foot and ankle with some soft tissue gas notable around the wound and progressive lateral malleolus osteomyelitis.  Official radiology report(s):  Dg Ankle Complete Right  Result Date: 11/20/2018 CLINICAL DATA:  53 year old female with deep soft tissue wound. EXAM: RIGHT ANKLE - COMPLETE 3+ VIEW COMPARISON:  04/29/2018 and earlier. FINDINGS: Chronic severely abnormal distal tib fib, ankle and visible foot. As in 2019, suspect a combination of  chronic neuropathic joint and prior osteomyelitis. There is a superimposed deep lateral soft tissue wound with abnormal soft tissue gas tracking to the periosteum of the abnormal distal fibula. There is also abnormal soft tissue gas at the medial ankle. There has been cortical osteolysis of the distal fibula since July. IMPRESSION: 1. Chronic severely abnormal distal tib-fib, ankle and foot. 2. Abnormal soft tissue gas medially and laterally, highly suspicious for GAS-FORMING INFECTION. 3. Evidence of Progressive Osteomyelitis at the lateral malleolus since July. Electronically Signed   By: Odessa Fleming M.D.   On: 11/20/2018 00:06   Dg Chest Port 1 View  Result Date: 11/19/2018 CLINICAL DATA:  Sepsis EXAM: PORTABLE CHEST 1 VIEW COMPARISON:  04/29/2018 FINDINGS: Mild cardiomegaly with central congestion. No focal consolidation or effusion. No pneumothorax. Aortic atherosclerosis. IMPRESSION: Borderline to mild cardiomegaly with central vascular congestion. No focal pulmonary infiltrate. Electronically Signed   By: Jasmine Pang M.D.   On: 11/19/2018 23:22    ____________________________________________   PROCEDURES  Critical Care performed: Yes, see critical care procedure note(s)   Procedure(s) performed:   .Critical Care Performed by: Loleta Rose, MD Authorized by: Loleta Rose, MD   Critical care provider statement:    Critical care time (minutes):  45   Critical care time was exclusive of:  Separately billable procedures and treating other patients   Critical care was necessary to treat or prevent imminent or life-threatening deterioration of the following conditions:  Sepsis   Critical care was time spent personally by me on the following activities:  Development of treatment plan with patient or surrogate, discussions with consultants, evaluation of patient's response to treatment, examination of patient, obtaining history from patient or surrogate, ordering and performing treatments and  interventions, ordering and review of laboratory studies, ordering and review of radiographic studies, pulse oximetry, re-evaluation of patient's condition and review of old charts     ____________________________________________   INITIAL IMPRESSION / ASSESSMENT AND PLAN / ED COURSE  As part of my medical decision making, I reviewed the following data within the electronic MEDICAL RECORD NUMBER Nursing notes reviewed and incorporated, Labs reviewed , Old chart reviewed, Radiograph reviewed , Discussed with admitting physician ,  Discussed with orthopedic surgeon, Notes from prior ED visits and [place] Controlled Substance Database    Differential diagnosis includes, but is not limited to, sepsis, osteomyelitis, nonspecific acute on chronic wound infection, necrotizing fasciitis, cellulitis.  The patient is denying any respiratory symptoms and is denying chest pain and abdominal pain.  She is febrile, tachycardic, and tachypneic and meet sepsis criteria.  I made her code sepsis and ordered Zosyn 3.375 g IV and vancomycin 1 g IV for what appears to be a purulent and severe wound infection.  There is no crepitus and the pain is not out of proportion to the exam and I doubt necrotizing fasciitis at this time.  Radiographs of the right ankle are pending.  In addition to the vital sign abnormalities, lab work is notable for an acute on chronic anemia with a hemoglobin of less than 7 although she is denying any symptoms of symptomatic anemia such as weakness or shortness of breath.  She is mildly hyponatremic with a creatinine that is 1.66 which is a little bit above her baseline.  She is getting 1 L normal saline IV bolus but she does not meet criteria for severe sepsis or septic shock so I am not giving her 30 mL/kg.  She has a leukocytosis of 13.3 and blood cultures are pending.  Lactic acid is normal but procalcitonin is still pending.  Clinical Course as of Nov 21 243  Fri Nov 20, 2018  0019 Radiographs  concerning for gas-forming infection and osteomyelitis.  Discussed with Dr. Loralie Champagne. He feels the patient will need amputation but that it is not a surgical emergency right this moment because the gas does not extend proximally beyond the ankle/wound.  Recommended inpatient treatment with antibiotics and vascular consultation.  I have paged Dr. Gilda Crease to discuss.  Also added clindamycin 900 mg IV to the antibiotics regimen.  DG Ankle Complete Right [CF]  0030 I spoke by phone with Dr. Gilda Crease and we reviewed the case.  He agreed that this is not a surgical emergency but he will need to be consulted later today and the patient will likely need an amputation.  He agreed with my broad-spectrum antibiotic choices.  I have discussed the case just now in person with Dr. Anne Hahn with hospitalist service who will admit.   [CF]  0031 Consistent with sepsis  Procalcitonin: 15.32 [CF]  0244 I verified by phone just now that Dr. Sheryle Hail with the hospitalist service knows of the patient and will be admitting her.   [CF]    Clinical Course User Index [CF] Loleta Rose, MD    ____________________________________________  FINAL CLINICAL IMPRESSION(S) / ED DIAGNOSES  Final diagnoses:  Sepsis, due to unspecified organism, unspecified whether acute organ dysfunction present (HCC)  Wound infection  Ankle ulcer, right, with unspecified severity (HCC)  Acute kidney injury (HCC)  Acute on chronic anemia  Osteomyelitis of right ankle, unspecified type (HCC)     MEDICATIONS GIVEN DURING THIS VISIT:  Medications  oxyCODONE-acetaminophen (PERCOCET/ROXICET) 5-325 MG per tablet 2 tablet (0 tablets Oral Hold 11/20/18 0000)  vancomycin (VANCOCIN) IVPB 1000 mg/200 mL premix (has no administration in time range)  sodium chloride flush (NS) 0.9 % injection 3 mL (3 mLs Intravenous Given 11/19/18 2355)  sodium chloride 0.9 % bolus 1,000 mL (0 mLs Intravenous Stopped 11/20/18 0101)  piperacillin-tazobactam (ZOSYN) IVPB  3.375 g (0 g Intravenous Stopped 11/20/18 0018)  vancomycin (VANCOCIN) IVPB 1000 mg/200 mL premix (0 mg Intravenous Stopped 11/20/18 0101)  clindamycin (CLEOCIN) IVPB  900 mg (0 mg Intravenous Stopped 11/20/18 0101)     ED Discharge Orders    None       Note:  This document was prepared using Dragon voice recognition software and may include unintentional dictation errors.   Loleta Rose, MD 11/20/18 [id number]

## 2018-11-20 NOTE — Progress Notes (Signed)
CODE SEPSIS - PHARMACY COMMUNICATION  **Broad Spectrum Antibiotics should be administered within 1 hour of Sepsis diagnosis**  Time Code Sepsis Called/Page Received: 0220 2330  Antibiotics Ordered: 0220 2332  Time of 1st antibiotic administration: 00220 2347  Additional action taken by pharmacy:   If necessary, Name of Provider/Nurse Contacted:     Erich Montane ,PharmD Clinical Pharmacist  11/20/2018  12:06 AM

## 2018-11-20 NOTE — ED Notes (Signed)
Pt given meal tray.

## 2018-11-20 NOTE — ED Notes (Signed)
Pt made aware that monitor needs adjusting because it is giving inaccurate readings due to poor positing on pt, pt refuses for monitor to be adjusted at this time. Pt has equal chest rise and radial pulses palpated.

## 2018-11-20 NOTE — Consult Note (Signed)
[facility] Daily Progress Note   Vascular Surgery Communication Note  Vascular Surgery was consulted for further recommendations including possible amputation / wound debridement by ER physician. At this time, the patient is refusing to be seen by the Vascular Surgery service. Patient is also refusing any procedures at this time.  Vascular will sign off at this time. Please re-consult if the situation changes.  Discussed with Dr. Charlie Pitter Stegmayer PA-C 11/20/2018 9:05 AM

## 2018-11-20 NOTE — H&P (Signed)
Jackie Miles is an 53 y.o. female.   Chief Complaint: Back pain HPI: The patient with past medical history of hypertension, osteomyelitis with chronic nonhealing wound of the right foot as well as diabetes presents the emergency department complaining of lower back pain.  She presented to an outside hospital 3 days ago with the same complaint but left AMA.  Upon arrival patient was found to be febrile.  Her right foot was draining and foul-smelling which indicated obvious source of infection that is known to the treatment team.  The patient had been prescribed Bactrim but she admits to not taking at times.  Code sepsis was initiated prior to the emergency department staff calling the hospitalist service for admission.  Past Medical History:  Diagnosis Date  . Asthma   . Diabetes mellitus without complication (HCC)   . Fibromyalgia   . H/O percutaneous left heart catheterization   . HLD (hyperlipidemia)   . Hypertension   . Shingles     Past Surgical History:  Procedure Laterality Date  . toe amputation      Family History  Problem Relation Age of Onset  . Diabetes Mother   . Diabetes Sister    Social History:  reports that she has been smoking. She has been smoking about 0.50 packs per day. She has never used smokeless tobacco. She reports that she does not drink alcohol or use drugs.  Allergies:  Allergies  Allergen Reactions  . Ibuprofen Other (See Comments)    Reaction:  Acid reflux     Prior to Admission medications   Medication Sig Start Date End Date Taking? Authorizing Provider  amitriptyline (ELAVIL) 100 MG tablet Take 1 tablet (100 mg total) by mouth at bedtime. 11/06/18   Pucilowska, Braulio Conte B, MD  gabapentin (NEURONTIN) 600 MG tablet Take 1 tablet (600 mg total) by mouth 2 (two) times daily. 11/06/18   Pucilowska, Braulio Conte B, MD  metFORMIN (GLUCOPHAGE) 1000 MG tablet Take 1 tablet (1,000 mg total) by mouth 2 (two) times daily with a meal. 11/06/18   Pucilowska, Jolanta B,  MD  mupirocin cream (BACTROBAN) 2 % Apply topically daily. 11/07/18   Shari Prows, MD     Results for orders placed or performed during the hospital encounter of 11/19/18 (from the past 48 hour(s))  Comprehensive metabolic panel     Status: Abnormal   Collection Time: 11/19/18 11:00 PM  Result Value Ref Range   Sodium 131 (L) 135 - 145 mmol/L   Potassium 4.3 3.5 - 5.1 mmol/L   Chloride 107 98 - 111 mmol/L   CO2 17 (L) 22 - 32 mmol/L   Glucose, Bld 182 (H) 70 - 99 mg/dL   BUN 38 (H) 6 - 20 mg/dL   Creatinine, Ser 0.31 (H) 0.44 - 1.00 mg/dL   Calcium 8.4 (L) 8.9 - 10.3 mg/dL   Total Protein 6.5 6.5 - 8.1 g/dL   Albumin 2.3 (L) 3.5 - 5.0 g/dL   AST 14 (L) 15 - 41 U/L   ALT 8 0 - 44 U/L   Alkaline Phosphatase 105 38 - 126 U/L   Total Bilirubin 0.6 0.3 - 1.2 mg/dL   GFR calc non Af Amer 35 (L) >60 mL/min   GFR calc Af Amer 40 (L) >60 mL/min   Anion gap 7 5 - 15    Comment: Performed at [facility], [address] [place], [place] [address]  Lactic acid, plasma     Status: None   Collection  Time: 11/19/18 11:00 PM  Result Value Ref Range   Lactic Acid, Venous 1.5 0.5 - 1.9 mmol/L    Comment: Performed at [facility], [address] [place], [place] [address]  CBC with Differential     Status: Abnormal   Collection Time: 11/19/18 11:00 PM  Result Value Ref Range   WBC 13.3 (H) 4.0 - 10.5 K/uL   RBC 2.57 (L) 3.87 - 5.11 MIL/uL   Hemoglobin 6.3 (L) 12.0 - 15.0 g/dL    Comment: Reticulocyte Hemoglobin testing may be clinically indicated, consider ordering this additional test [id number]    HCT 19.8 (L) 36.0 - 46.0 %   MCV 77.0 (L) 80.0 - 100.0 fL   MCH 24.5 (L) 26.0 - 34.0 pg   MCHC 31.8 30.0 - 36.0 g/dL   RDW 11.9 (H) 14.7 - 82.9 %   Platelets 404 (H) 150 - 400 K/uL   nRBC 0.0 0.0 - 0.2 %   Neutrophils Relative % 78 %   Neutro Abs 10.3 (H) 1.7 - 7.7 K/uL   Lymphocytes Relative 10 %   Lymphs Abs 1.4 0.7 - 4.0 K/uL   Monocytes Relative 11 %    Monocytes Absolute 1.5 (H) 0.1 - 1.0 K/uL   Eosinophils Relative 0 %   Eosinophils Absolute 0.0 0.0 - 0.5 K/uL   Basophils Relative 0 %   Basophils Absolute 0.0 0.0 - 0.1 K/uL   Immature Granulocytes 1 %   Abs Immature Granulocytes 0.15 (H) 0.00 - 0.07 K/uL    Comment: Performed at [facility], [address] [place], [place] [address]  Protime-INR     Status: Abnormal   Collection Time: 11/19/18 11:00 PM  Result Value Ref Range   Prothrombin Time 15.4 (H) 11.4 - 15.2 seconds   INR 1.23     Comment: Performed at [facility], [address], [place], [place] [address]  Procalcitonin     Status: None   Collection Time: 11/19/18 11:00 PM  Result Value Ref Range   Procalcitonin 15.32 ng/mL    Comment:        Interpretation: PCT >= 10 ng/mL: Important systemic inflammatory response, almost exclusively due to severe bacterial sepsis or septic shock. (NOTE)       Sepsis PCT Algorithm           Lower Respiratory Tract                                      Infection PCT Algorithm    ----------------------------     ----------------------------         PCT < 0.25 ng/mL                PCT < 0.10 ng/mL         Strongly encourage             Strongly discourage   discontinuation of antibiotics    initiation of antibiotics    ----------------------------     -----------------------------       PCT 0.25 - 0.50 ng/mL            PCT 0.10 - 0.25 ng/mL               OR       >80% decrease in PCT            Discourage initiation of  antibiotics      Encourage discontinuation           of antibiotics    ----------------------------     -----------------------------         PCT >= 0.50 ng/mL              PCT 0.26 - 0.50 ng/mL                AND       <80% decrease in PCT             Encourage initiation of                                             antibiotics       Encourage continuation           of antibiotics     ----------------------------     -----------------------------        PCT >= 0.50 ng/mL                  PCT > 0.50 ng/mL               AND         increase in PCT                  Strongly encourage                                      initiation of antibiotics    Strongly encourage escalation           of antibiotics                                     -----------------------------                                           PCT <= 0.25 ng/mL                                                 OR                                        > 80% decrease in PCT                                     Discontinue / Do not initiate                                             antibiotics Performed at [facility], [address] [place], [place] [address]   Brain natriuretic peptide - IF patient is dyspneic     Status: Abnormal  Collection Time: 11/19/18 11:00 PM  Result Value Ref Range   B Natriuretic Peptide 247.0 (H) 0.0 - 100.0 pg/mL    Comment: Performed at [facility], [address] [place], [place] [address]  Lipase, blood     Status: None   Collection Time: 11/19/18 11:00 PM  Result Value Ref Range   Lipase 36 11 - 51 U/L    Comment: Performed at [facility], [address] [place], [place] [address]  Type and screen [facility]     Status: None   Collection Time: 11/19/18 11:52 PM  Result Value Ref Range   ABO/RH(D) A POS    Antibody Screen NEG    Sample Expiration      11/22/2018 Performed at [facility], [address] [place], [place] [address]    Dg Lumbar Spine 2-3 Views  Result Date: 11/19/2018 CLINICAL DATA:  53 year old female with a history lumbar back pain EXAM: LUMBAR SPINE - 2-3 VIEW COMPARISON:  None. FINDINGS: Lumbar Spine: Lumbar vertebral elements maintain normal alignment without evidence of anterolisthesis, retrolisthesis, subluxation. No acute fracture line identified. Vertebral body heights  maintained. Mild disc space narrowing with early endplate changes. No significant facet hypertrophy Calcifications of the vasculature IMPRESSION: Negative for acute fracture or malalignment of the lumbar spine. Electronically Signed   By: Gilmer MorJaime  Wagner D.O.   On: 11/19/2018 15:12   Dg Ankle Complete Right  Result Date: 11/20/2018 CLINICAL DATA:  53 year old female with deep soft tissue wound. EXAM: RIGHT ANKLE - COMPLETE 3+ VIEW COMPARISON:  04/29/2018 and earlier. FINDINGS: Chronic severely abnormal distal tib fib, ankle and visible foot. As in 2019, suspect a combination of chronic neuropathic joint and prior osteomyelitis. There is a superimposed deep lateral soft tissue wound with abnormal soft tissue gas tracking to the periosteum of the abnormal distal fibula. There is also abnormal soft tissue gas at the medial ankle. There has been cortical osteolysis of the distal fibula since July. IMPRESSION: 1. Chronic severely abnormal distal tib-fib, ankle and foot. 2. Abnormal soft tissue gas medially and laterally, highly suspicious for GAS-FORMING INFECTION. 3. Evidence of Progressive Osteomyelitis at the lateral malleolus since July. Electronically Signed   By: Odessa FlemingH  Hall M.D.   On: 11/20/2018 00:06   Dg Chest Port 1 View  Result Date: 11/19/2018 CLINICAL DATA:  Sepsis EXAM: PORTABLE CHEST 1 VIEW COMPARISON:  04/29/2018 FINDINGS: Mild cardiomegaly with central congestion. No focal consolidation or effusion. No pneumothorax. Aortic atherosclerosis. IMPRESSION: Borderline to mild cardiomegaly with central vascular congestion. No focal pulmonary infiltrate. Electronically Signed   By: Jasmine PangKim  Fujinaga M.D.   On: 11/19/2018 23:22    Review of Systems  Constitutional: Negative for chills and fever.  HENT: Negative for sore throat and tinnitus.   Eyes: Negative for blurred vision and redness.  Respiratory: Negative for cough and shortness of breath.   Cardiovascular: Negative for chest pain, palpitations,  orthopnea and PND.  Gastrointestinal: Negative for abdominal pain, diarrhea, nausea and vomiting.  Genitourinary: Negative for dysuria, frequency and urgency.  Musculoskeletal: Positive for back pain. Negative for joint pain and myalgias.  Skin: Negative for rash.       No lesions  Neurological: Negative for speech change, focal weakness and weakness.  Endo/Heme/Allergies: Does not bruise/bleed easily.       No temperature intolerance  Psychiatric/Behavioral: Negative for depression and suicidal ideas.    Blood pressure (!) 117/49, pulse (!) 104, temperature (!) 103.3 F (39.6 C), temperature source Rectal,  resp. rate 19, height 5\' 8"  (1.727 m), weight 86.1 kg, SpO2 98 %. Physical Exam  Vitals reviewed. Constitutional: She is oriented to person, place, and time. She appears well-developed and well-nourished. No distress.  HENT:  Head: Normocephalic and atraumatic.  Mouth/Throat: Oropharynx is clear and moist.  Eyes: Pupils are equal, round, and reactive to light. Conjunctivae and EOM are normal. No scleral icterus.  Neck: Normal range of motion. Neck supple. No JVD present. No tracheal deviation present. No thyromegaly present.  Cardiovascular: Normal rate, regular rhythm and normal heart sounds. Exam reveals no gallop and no friction rub.  No murmur heard. Respiratory: Effort normal and breath sounds normal.  GI: Soft. Bowel sounds are normal. She exhibits no distension. There is no abdominal tenderness.  Genitourinary:    Genitourinary Comments: Deferred   Musculoskeletal: Normal range of motion.        General: No edema.     Comments: Foul smelling root foot with drainage  Lymphadenopathy:    She has no cervical adenopathy.  Neurological: She is alert and oriented to person, place, and time. No cranial nerve deficit. She exhibits normal muscle tone.  Skin: Skin is warm and dry. No rash noted. No erythema.  Psychiatric: She has a normal mood and affect. Her behavior is normal.  Judgment and thought content normal.     Assessment/Plan This is a 53 year old female admitted for gangrene of the right foot. 1.  Gangrene: Right foot with soft tissue evidence of infection with gas-forming bacteria.  Also worsening osteomyelitis of the ankle.  The patient had been prescribed Bactrim that she admits to not taking regularly.  The patient has fever and chills.  I have discussed with her that her foot may need amputation.  She appears to be in denial of this possibility despite the fact that I have emphasized that she is likely septic from her chronic foot infection.  Consult general surgery/podiatry. 2.  Sepsis: The patient meets criteria via fever, tachycardia and leukocytosis.  She also has an elevated lactic acid acid. 3.  Diabetes mellitus type 2: Uncontrolled; contributing to poor wound healing.  Continue sliding scale insulin.  Hold metformin. 4.  Hyponatremia: Likely secondary to hypovolemia.  Replete with normal saline.  Encourage p.o. intake (after surgery) 5.  Acute kidney injury: Superimposed on chronic kidney disease.  Avoid nephrotoxic agents.  Hydrate with intravenous fluid. 6.  DVT prophylaxis: Heparin 7.  GI prophylaxis: None The patient is a full code.  Time spent on admission orders and patient care approximately 45 minutes   Arnaldo Natal, MD 11/20/2018, 4:34 AM

## 2018-11-20 NOTE — Discharge Summary (Addendum)
  Patient is discharging against medical advice.  Jackie Miles is a 53 year old female with history of asthma diabetes and chronic right foot open wound for which she has been seen at [facility] recently and apparently left against medical advice. His been noncompliant with her antibiotics for the foot. She was seen by Dr. Ether Griffins in the emergency room. Her right foot is non-salvageable per Dr. Ether Griffins. Amputation has been recommended. Patient gets very upset when amputation is discussed. She was initially not wanting to get IV antibiotic. I discussed with her and she wants to leave and tells me I will not let anybody touch my foot. Patient understands the risk and complications involved with foul-smelling/nonhealing ulcer. Patient was told that this infection could be life-threatening. She was still not agreeable to get admitted to the hospital. No family was in the ER. I have told the patient to list finished the prescription antibiotic that was prescribed to her early part of the week. Patient's ER nurse informed of patient wanting to leave the hospital. She would not want me to open the dressing to take a look at the right foot.

## 2018-11-21 LAB — URINE CULTURE: CULTURE: NO GROWTH

## 2018-11-23 LAB — AEROBIC/ANAEROBIC CULTURE W GRAM STAIN (SURGICAL/DEEP WOUND)
Culture: NORMAL
Gram Stain: NONE SEEN
Special Requests: NORMAL

## 2018-11-23 LAB — AEROBIC/ANAEROBIC CULTURE (SURGICAL/DEEP WOUND)

## 2018-11-24 LAB — CULTURE, BLOOD (ROUTINE X 2)
Culture: NO GROWTH
Culture: NO GROWTH
Special Requests: ADEQUATE

## 2018-12-04 ENCOUNTER — Encounter: Payer: Self-pay | Admitting: Emergency Medicine

## 2018-12-04 ENCOUNTER — Ambulatory Visit
Admission: EM | Admit: 2018-12-04 | Discharge: 2018-12-04 | Disposition: A | Discharge status: Home / Self Care | Payer: Medicaid Other | Attending: Family Medicine | Admitting: Family Medicine

## 2018-12-04 DIAGNOSIS — E119 Type 2 diabetes mellitus without complications: Secondary | ICD-10-CM

## 2018-12-04 DIAGNOSIS — L97519 Non-pressure chronic ulcer of other part of right foot with unspecified severity: Secondary | ICD-10-CM

## 2018-12-04 DIAGNOSIS — F17210 Nicotine dependence, cigarettes, uncomplicated: Secondary | ICD-10-CM | POA: Diagnosis not present

## 2018-12-04 MED ORDER — HYDROCODONE-ACETAMINOPHEN 5-325 MG PO TABS: ORAL_TABLET | ORAL | 0 refills | Status: DC

## 2018-12-04 MED ORDER — SULFAMETHOXAZOLE-TRIMETHOPRIM 800-160 MG PO TABS: 1.0000 | ORAL_TABLET | Freq: Two times a day (BID) | ORAL | 0 refills | Status: DC

## 2018-12-04 NOTE — ED Triage Notes (Signed)
Pt c/o right foot pain she reports that she keeps hitting it on her wheelchair. She lost her antibiotic and she unsure of the name of it. She also fell out of the bath tub and hurt her back.

## 2018-12-04 NOTE — ED Provider Notes (Signed)
[facility]    CSN: [id number] Arrival date & time: 12/04/18  1427     History   Chief Complaint Chief Complaint  Patient presents with  . Foot Pain    right    HPI Jackie Miles is a 53 y.o. female.   53 yo female with a c/o right foot pain. States she has an ulcer which is better than when she was in the hospital but she lost her antibiotic and is requesting medication for this. Patient has a documented history of noncompliance with medications and treatments. Patient states she's not going to the hospital regardless of recommendation. Patient was recently hospitalized for this on 11/19/18, about 2 weeks ago. Patient denies any fevers or chills.   The history is provided by the patient.  Foot Pain     Past Medical History:  Diagnosis Date  . Asthma   . Diabetes mellitus without complication (HCC)   . Fibromyalgia   . H/O percutaneous left heart catheterization   . HLD (hyperlipidemia)   . Hypertension   . Shingles     Patient Active Problem List   Diagnosis Date Noted  . Non-healing ulcer of right foot (HCC) 11/20/2018  . Tobacco use disorder 11/06/2018  . Delusional disorder (HCC) 11/04/2018  . Cellulitis 09/07/2017  . HTN (hypertension) 05/28/2017  . Asthma 05/28/2017  . HLD (hyperlipidemia) 05/28/2017  . AKI (acute kidney injury) (HCC) 05/28/2017  . 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 Cellulitis of foot, right   . Pressure injury of skin 02/14/2017  . Osteomyelitis (HCC) 02/21/2016  . Foot infection 01/22/2016  . Sepsis (HCC) 01/12/2016    Past Surgical History:  Procedure Laterality Date  . toe amputation      OB History   No obstetric history on file.      Home Medications    Prior to Admission medications   Medication Sig Start Date End Date Taking? Authorizing Provider  amitriptyline (ELAVIL) 100 MG tablet Take 1 tablet (100 mg total) by mouth at bedtime. 11/06/18  Yes Pucilowska, Jolanta B, MD    gabapentin (NEURONTIN) 600 MG tablet Take 1 tablet (600 mg total) by mouth 2 (two) times daily. Patient taking differently: Take 800 mg by mouth 3 (three) times daily.  11/06/18  Yes Pucilowska, Jolanta B, MD  metFORMIN (GLUCOPHAGE) 1000 MG tablet Take 1 tablet (1,000 mg total) by mouth 2 (two) times daily with a meal. 11/06/18  Yes Pucilowska, Jolanta B, MD  mupirocin cream (BACTROBAN) 2 % Apply topically daily. 11/07/18  Yes Pucilowska, Jolanta B, MD  HYDROcodone-acetaminophen (NORCO/VICODIN) 5-325 MG tablet 1-2 tab po qd prn 12/04/18   Payton Mccallum, MD  sulfamethoxazole-trimethoprim (BACTRIM DS,SEPTRA DS) 800-160 MG tablet Take 1 tablet by mouth 2 (two) times daily. 12/04/18   Payton Mccallum, MD    Family History Family History  Problem Relation Age of Onset  . Diabetes Mother   . Diabetes Sister     Social History Social History   Tobacco Use  . Smoking status: Current Every Day Smoker    Packs/day: 0.50  . Smokeless tobacco: Never Used  Substance Use Topics  . Alcohol use: No  . Drug use: No     Allergies   Ibuprofen   Review of Systems Review of Systems   Physical Exam Triage Vital Signs ED Triage Vitals  Enc Vitals Group     BP 12/04/18 1445 123/66     Pulse Rate 12/04/18 1445  91     Resp 12/04/18 1445 18     Temp 12/04/18 1445 98.8 F (37.1 C)     Temp Source 12/04/18 1445 Oral     SpO2 12/04/18 1445 100 %     Weight 12/04/18 1440 198 lb (89.8 kg)     Height --      Head Circumference --      Peak Flow --      Pain Score 12/04/18 1440 9     Pain Loc --      Pain Edu? --      Excl. in GC? --    No data found.  Updated Vital Signs BP 123/66 (BP Location: Left Arm)   Pulse 91   Temp 98.8 F (37.1 C) (Oral)   Resp 18   Wt 89.8 kg   SpO2 100%   BMI 30.11 kg/m   Visual Acuity Right Eye Distance:   Left Eye Distance:   Bilateral Distance:    Right Eye Near:   Left Eye Near:    Bilateral Near:     Physical Exam Vitals signs and nursing note  reviewed.  Constitutional:      General: She is not in acute distress.    Appearance: She is not ill-appearing, toxic-appearing or diaphoretic.  Musculoskeletal:     Comments: Right foot with amputated 5th toe; ulcer lesion on lateral malleolus with mild drainage noted; charcot foot  Neurological:     Mental Status: She is alert.      UC Treatments / Results  Labs (all labs ordered are listed, but only abnormal results are displayed) Labs Reviewed - No data to display  EKG None  Radiology No results found.  Procedures Procedures (including critical care time)  Medications Ordered in UC Medications - No data to display  Initial Impression / Assessment and Plan / UC Course  I have reviewed the triage vital signs and the nursing notes.  Pertinent labs & imaging results that were available during my care of the patient were reviewed by me and considered in my medical decision making (see chart for details).      Final Clinical Impressions(s) / UC Diagnoses   Final diagnoses:  Ulcer of right foot, unspecified ulcer stage [facility])     Discharge Instructions     Follow up with PCP and wound specialist    ED Prescriptions    Medication Sig Dispense Auth. Provider   sulfamethoxazole-trimethoprim (BACTRIM DS,SEPTRA DS) 800-160 MG tablet Take 1 tablet by mouth 2 (two) times daily. 20 tablet Payton Mccallum, MD   HYDROcodone-acetaminophen (NORCO/VICODIN) 5-325 MG tablet 1-2 tab po qd prn 6 tablet Payton Mccallum, MD     1.diagnosis reviewed with patient 2. rx as per orders above; reviewed possible side effects, interactions, risks and benefits  3. Recommend follow up with PCP and wound care clinic (patient states she goes to [facility] wound care); go to ED if symptoms worsen   Controlled Substance Prescriptions [place] Controlled Substance Registry consulted? Not Applicable   Payton Mccallum, MD 12/04/18 1534

## 2018-12-04 NOTE — Discharge Instructions (Addendum)
Follow up with PCP and wound specialist

## 2019-01-13 ENCOUNTER — Ambulatory Visit
Admission: EM | Admit: 2019-01-13 | Discharge: 2019-01-13 | Disposition: A | Discharge status: Other Acute Inpatient Hospital | Payer: Medicaid Other | Attending: Family Medicine | Admitting: Family Medicine

## 2019-01-13 ENCOUNTER — Other Ambulatory Visit: Payer: Self-pay

## 2019-01-13 ENCOUNTER — Encounter: Payer: Self-pay | Admitting: Emergency Medicine

## 2019-01-13 DIAGNOSIS — R600 Localized edema: Secondary | ICD-10-CM

## 2019-01-13 DIAGNOSIS — F17210 Nicotine dependence, cigarettes, uncomplicated: Secondary | ICD-10-CM

## 2019-01-13 DIAGNOSIS — S81801A Unspecified open wound, right lower leg, initial encounter: Secondary | ICD-10-CM | POA: Diagnosis not present

## 2019-01-13 DIAGNOSIS — E119 Type 2 diabetes mellitus without complications: Secondary | ICD-10-CM | POA: Diagnosis not present

## 2019-01-13 NOTE — ED Notes (Signed)
Reviewed discharge instructions with patient. Patient verbalizes understanding that she has been told her foot needs to be amputated and patient does not agree with that plan. Patient was advised to go to [facility] ER for further evaluation and patient has agreed to be seen at [facility] ER. No further questions or concerns at this time.

## 2019-01-13 NOTE — ED Triage Notes (Signed)
Patient c/o right leg pain and swelling that started 6 weeks ago. She was supposed to go see her doctor in [facility] to follow up but she as unable to. Patient also states she was bit by a tick 5 days ago on the back of her left leg.

## 2019-01-13 NOTE — ED Provider Notes (Signed)
[facility] ____________________________________________  Time seen: Approximately 10:52 AM  I have reviewed the triage vital signs and the nursing notes.   HISTORY  Chief Complaint Leg Pain  HPI Jackie Miles is a 53 y.o. female history of diabetes, osteomyelitis presenting for right leg pain and swelling.  This is been an ongoing issue.  Patient has been evaluated at multiple facilities including [facility] on 11/19/2018 where she was seen by podiatry and recommended amputation of right ankle and foot due to "nonsalvageable "per Dr. Ether GriffinsFowler.  Patient was then seen at urgent care on 12/04/2018 and was given antibiotic Bactrim and Norco.  Patient was then also [facility] on 12/13/2018 requesting refill of antibiotics and refusing IV antibiotics or surgical intervention.  Patient presenting today requesting antibiotics for right leg as she states the antibiotics helps the swelling in her calf and thigh.  States pain comes and goes.  States she does her dressing care at home.  Uses her wheelchair.  Patient states that the right lateral wound gets worse as she bumps into the side of her wheelchair with it.  Patient further states that she had a tick on the back of her left leg this past week and she thinks that this may be causing her right leg to swell.  Denies any rash or pain or swelling to left posterior leg tick bite site.  Denies any accompanying fever, cough, chest pain or shortness of breath.  Patient is requesting refill of antibiotics at this time.    Past Medical History:  Diagnosis Date  . Asthma   . Diabetes mellitus without complication (HCC)   . Fibromyalgia   . H/O percutaneous left heart catheterization   . HLD (hyperlipidemia)   . Hypertension   . Shingles     Patient Active Problem List   Diagnosis Date Noted  . Non-healing ulcer of right foot (HCC) 11/20/2018  . Tobacco use disorder 11/06/2018  . Delusional disorder (HCC) 11/04/2018  . Cellulitis  09/07/2017  . HTN (hypertension) 05/28/2017  . Asthma 05/28/2017  . HLD (hyperlipidemia) 05/28/2017  . AKI (acute kidney injury) (HCC) 05/28/2017  . 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 Cellulitis of foot, right   . Pressure injury of skin 02/14/2017  . Osteomyelitis (HCC) 02/21/2016  . Foot infection 01/22/2016  . Sepsis (HCC) 01/12/2016    Past Surgical History:  Procedure Laterality Date  . toe amputation       No current facility-administered medications for this encounter.   Current Outpatient Medications:  .  amitriptyline (ELAVIL) 100 MG tablet, Take 1 tablet (100 mg total) by mouth at bedtime., Disp: 30 tablet, Rfl: 0 .  gabapentin (NEURONTIN) 600 MG tablet, Take 1 tablet (600 mg total) by mouth 2 (two) times daily. (Patient taking differently: Take 800 mg by mouth 3 (three) times daily. ), Disp: 60 tablet, Rfl: 1 .  HYDROcodone-acetaminophen (NORCO/VICODIN) 5-325 MG tablet, 1-2 tab po qd prn, Disp: 6 tablet, Rfl: 0 .  metFORMIN (GLUCOPHAGE) 1000 MG tablet, Take 1 tablet (1,000 mg total) by mouth 2 (two) times daily with a meal., Disp: 60 tablet, Rfl: 1 .  mupirocin cream (BACTROBAN) 2 %, Apply topically daily., Disp: 15 g, Rfl: 0 .  sulfamethoxazole-trimethoprim (BACTRIM DS,SEPTRA DS) 800-160 MG tablet, Take 1 tablet by mouth 2 (two) times daily., Disp: 20 tablet, Rfl: 0  Allergies Ibuprofen  Family History  Problem Relation Age of Onset  . Diabetes Mother   .  Diabetes Sister     Social History Social History   Tobacco Use  . Smoking status: Current Every Day Smoker    Packs/day: 0.50  . Smokeless tobacco: Never Used  Substance Use Topics  . Alcohol use: No  . Drug use: No    Review of Systems Constitutional: No fever Cardiovascular: Denies chest pain. Respiratory: Denies shortness of breath. Gastrointestinal: No abdominal pain.  Musculoskeletal: Right leg swelling. Skin: Right ankle wound.   ____________________________________________   PHYSICAL EXAM:  VITAL SIGNS: ED Triage Vitals  Enc Vitals Group     BP 01/13/19 1022 130/80     Pulse Rate 01/13/19 1022 87     Resp 01/13/19 1022 18     Temp 01/13/19 1022 98.2 F (36.8 C)     Temp Source 01/13/19 1022 Oral     SpO2 01/13/19 1022 100 %     Weight 01/13/19 1024 182 lb 6.4 oz (82.7 kg)     Height 01/13/19 1020  (1.727 m)     Head Circumference --      Peak Flow --      Pain Score 01/13/19 1020 0     Pain Loc --      Pain Edu? --      Excl. in GC? --     Constitutional: Alert and oriented. Well appearing and in no acute distress. ENT      Head: Normocephalic and atraumatic. Cardiovascular: Normal rate, regular rhythm. Grossly normal heart sounds.  Good peripheral circulation. Respiratory: Normal respiratory effort without tachypnea nor retractions. Breath sounds are clear and equal bilaterally. No wheezes, rales, rhonchi. Musculoskeletal: In wheelchair. Patient with right lower extremity nonpitting edema with dusky darker coloration from calf through ankle, large amount of edema to right ankle with foot deformity, right lateral malleolus with large ulcerative wound with active drainage, medial malleolus with break in skin without drainage, thickened skin, dorsalis pedis and posterior tibialis pulses nonpalpable.  Neurologic:  Normal speech and language. Skin:  Skin is warm, dry.  Left posterior knee less than 0.5 cm healing indurated area, no erythema, no surrounding rash, no bull's-eye rash, nontender and no edema. Psychiatric: Mood and affect are normal. Speech and behavior are normal. Patient exhibits appropriate insight and judgment   ___________________________________________   LABS (all labs ordered are listed, but only abnormal results are displayed)  Labs Reviewed - No data to display ____________________________________________  PROCEDURES Procedures   INITIAL IMPRESSION / ASSESSMENT AND PLAN  / ED COURSE  Pertinent labs & imaging results that were available during my care of the patient were reviewed by me and considered in my medical decision making (see chart for details).  Patient with multiple medical comorbidities and documented history of noncompliance presenting for request of antibiotics for her right leg.  This is been an ongoing wound.  Patient has refused surgical intervention and amputation, as well as in reviewing care everywhere she has previously refused IV antibiotics and lab evaluations.  Long discussion had recommending further evaluation in emergency room at this time including laboratory studies, possible ultrasound and wound care.  Patient verbalized understanding of potential loss of limb to right lower extremity due to increasing infection, and including leading to possible DVT and sepsis.  Patient states that she will have her son take her to the emergency room at [facility].  Dressing to right leg applied by RN.  Patient verbalized understanding and agrees to this plan at discharge. ____________________________________________   FINAL CLINICAL IMPRESSION(S) / ED  DIAGNOSES  Final diagnoses:  Wound of right lower extremity, initial encounter  Leg edema, right     ED Discharge Orders    None       Note: This dictation was prepared with Dragon dictation along with smaller phrase technology. Any transcriptional errors that result from this process are unintentional.         Renford Dills, NP 01/13/19 1112

## 2019-01-13 NOTE — Discharge Instructions (Addendum)
Directly to the emergency room as discussed.

## 2019-02-19 IMAGING — DX DG CHEST 1V PORT
1 series · 1 of 1 positions shown · non-contrast
Comparison: 02/13/2017 and earlier.

CLINICAL DATA: 51-year-old female with sepsis.

EXAM:
PORTABLE CHEST 1 VIEW

[chest ap]
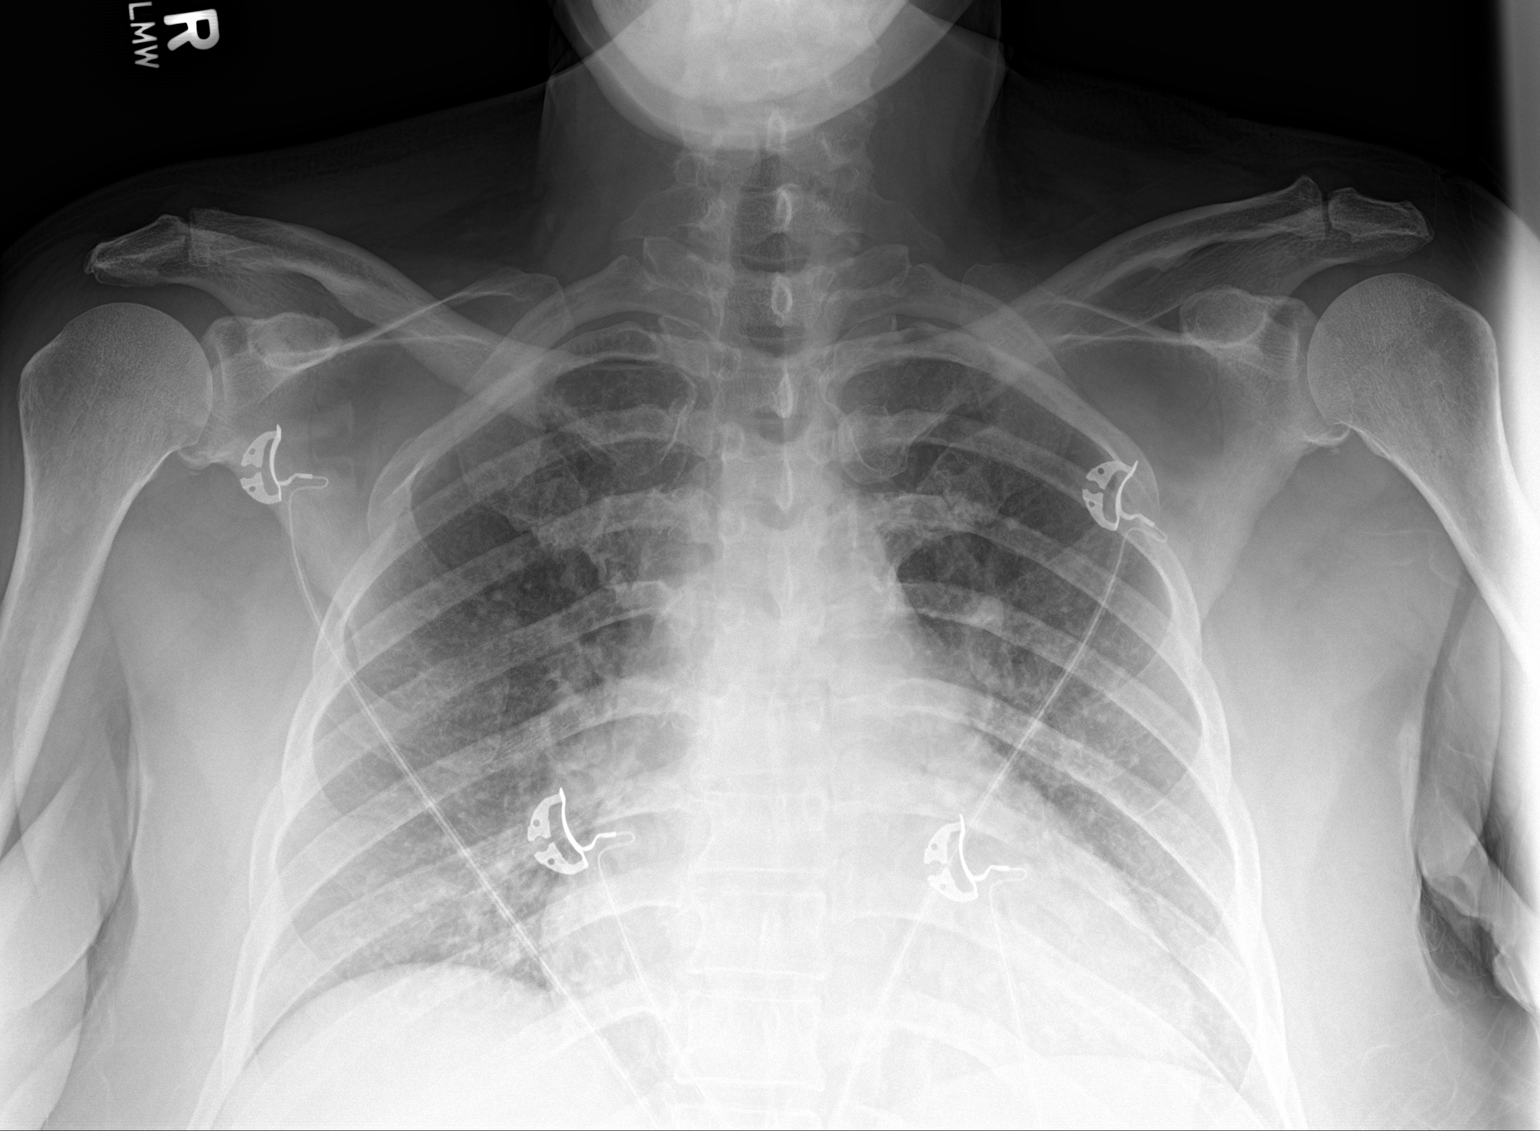

[1 of 1 positions shown; findings below may reference images not displayed]

FINDINGS: AP view of the chest at 4667 hours. Single lumen right chest
tunneled catheter has been removed. Stable cardiomegaly and
mediastinal contours. Mildly lower lung volumes. Stable pulmonary
vascularity without overt edema. No pneumothorax, pleural effusion
or confluent pulmonary opacity identified. No acute osseous
abnormality identified.
IMPRESSION: Stable cardiomegaly.  No acute cardiopulmonary abnormality.

## 2019-02-19 IMAGING — DX DG FOOT 2V*R*
2 series · 2 of 2 positions shown · non-contrast
Comparison: 02/13/2017 and earlier.

CLINICAL DATA: 51-year-old female with right foot plantar ulcer.
Sepsis.

EXAM:
RIGHT FOOT - 2 VIEW

[foot ap]
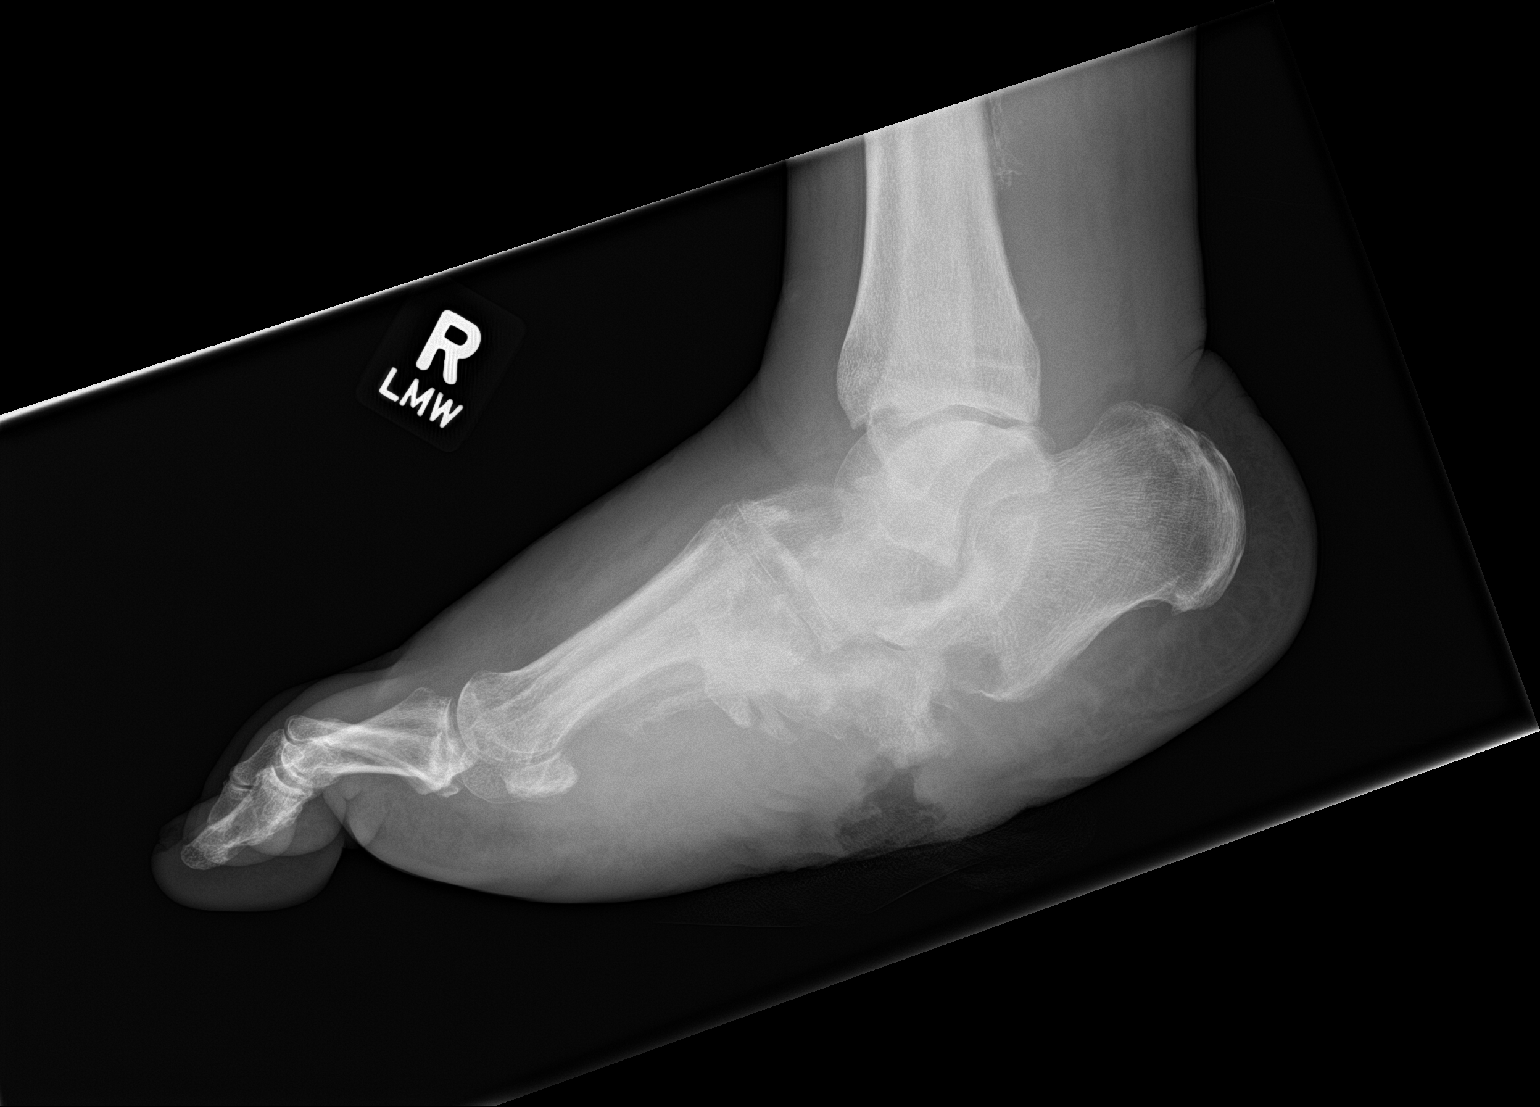

[foot lat]
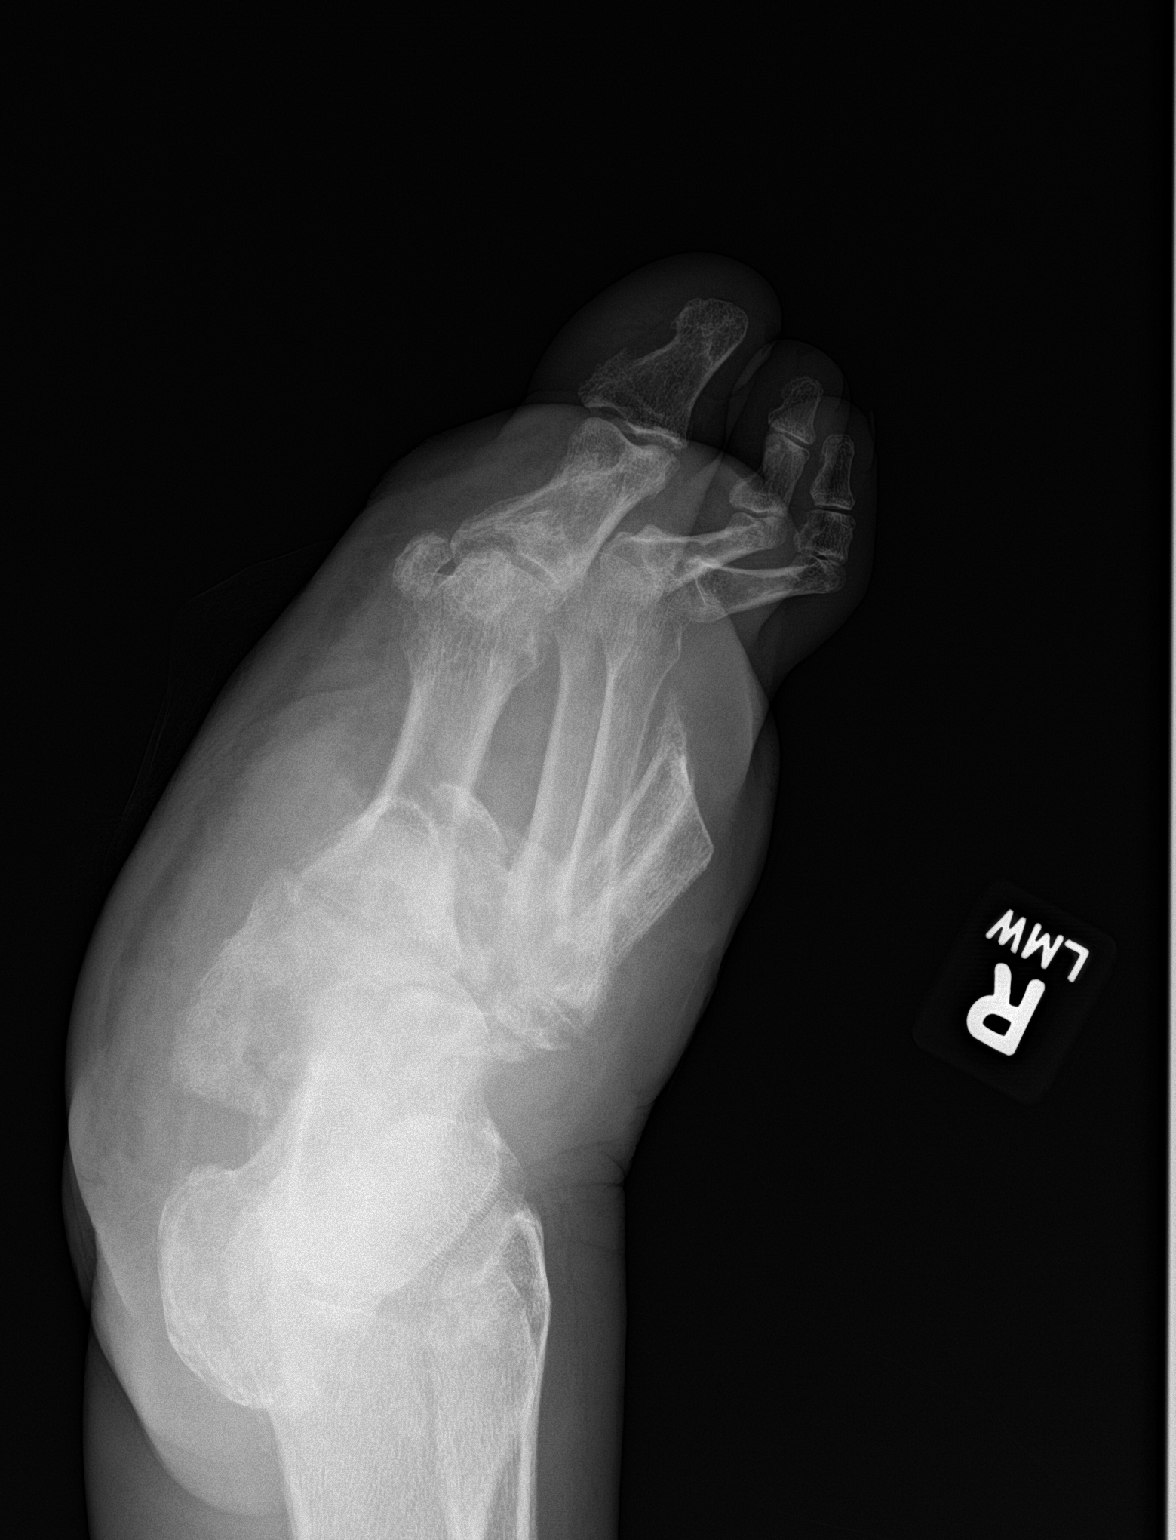

[2 of 2 positions shown; findings below may reference images not displayed]

FINDINGS: Two views.

Chronic Neuropathic foot with chronic midfoot bony collapse and
disorganization.

New osteopenia along the anterior calcaneus and new area of cortical
osteolysis [REDACTED]. This is in proximity to the large gas
containing plantar soft tissue wound.

Continued severe generalized soft tissue swelling about the right
foot. No tracking subcutaneous gas. Previous partial fourth and
fifth ray amputation.
IMPRESSION: 1. Positive for Osteomyelitis at the anterior calcaneus.
2. Chronic neuropathic changes to the right foot; Charcot joint.

## 2019-07-13 ENCOUNTER — Encounter: Payer: Self-pay | Admitting: *Deleted

## 2019-07-13 ENCOUNTER — Inpatient Hospital Stay: Payer: Medicaid Other

## 2019-07-13 ENCOUNTER — Inpatient Hospital Stay
Admission: EM | Admit: 2019-07-13 | Discharge: 2019-07-15 | DRG: 638 | Discharge status: Against Medical Advice | Payer: Medicaid Other | Attending: Internal Medicine | Admitting: Internal Medicine

## 2019-07-13 ENCOUNTER — Emergency Department: Payer: Medicaid Other

## 2019-07-13 ENCOUNTER — Other Ambulatory Visit: Payer: Self-pay

## 2019-07-13 DIAGNOSIS — Z8249 Family history of ischemic heart disease and other diseases of the circulatory system: Secondary | ICD-10-CM

## 2019-07-13 DIAGNOSIS — M14679 Charcot's joint, unspecified ankle and foot: Secondary | ICD-10-CM | POA: Diagnosis present

## 2019-07-13 DIAGNOSIS — L97519 Non-pressure chronic ulcer of other part of right foot with unspecified severity: Secondary | ICD-10-CM | POA: Diagnosis present

## 2019-07-13 DIAGNOSIS — E1122 Type 2 diabetes mellitus with diabetic chronic kidney disease: Secondary | ICD-10-CM | POA: Diagnosis present

## 2019-07-13 DIAGNOSIS — E11621 Type 2 diabetes mellitus with foot ulcer: Secondary | ICD-10-CM | POA: Diagnosis present

## 2019-07-13 DIAGNOSIS — Z833 Family history of diabetes mellitus: Secondary | ICD-10-CM | POA: Diagnosis not present

## 2019-07-13 DIAGNOSIS — E785 Hyperlipidemia, unspecified: Secondary | ICD-10-CM | POA: Diagnosis present

## 2019-07-13 DIAGNOSIS — Z993 Dependence on wheelchair: Secondary | ICD-10-CM

## 2019-07-13 DIAGNOSIS — E11628 Type 2 diabetes mellitus with other skin complications: Secondary | ICD-10-CM | POA: Diagnosis present

## 2019-07-13 DIAGNOSIS — Z20828 Contact with and (suspected) exposure to other viral communicable diseases: Secondary | ICD-10-CM | POA: Diagnosis present

## 2019-07-13 DIAGNOSIS — N183 Chronic kidney disease, stage 3 unspecified: Secondary | ICD-10-CM | POA: Diagnosis present

## 2019-07-13 DIAGNOSIS — L03115 Cellulitis of right lower limb: Secondary | ICD-10-CM | POA: Diagnosis present

## 2019-07-13 DIAGNOSIS — M86661 Other chronic osteomyelitis, right tibia and fibula: Secondary | ICD-10-CM | POA: Diagnosis present

## 2019-07-13 DIAGNOSIS — I129 Hypertensive chronic kidney disease with stage 1 through stage 4 chronic kidney disease, or unspecified chronic kidney disease: Secondary | ICD-10-CM | POA: Diagnosis present

## 2019-07-13 DIAGNOSIS — J45909 Unspecified asthma, uncomplicated: Secondary | ICD-10-CM | POA: Diagnosis present

## 2019-07-13 DIAGNOSIS — Z87311 Personal history of (healed) other pathological fracture: Secondary | ICD-10-CM | POA: Diagnosis not present

## 2019-07-13 DIAGNOSIS — Z7984 Long term (current) use of oral hypoglycemic drugs: Secondary | ICD-10-CM | POA: Diagnosis not present

## 2019-07-13 DIAGNOSIS — M79606 Pain in leg, unspecified: Secondary | ICD-10-CM | POA: Diagnosis present

## 2019-07-13 DIAGNOSIS — M86671 Other chronic osteomyelitis, right ankle and foot: Secondary | ICD-10-CM | POA: Diagnosis present

## 2019-07-13 DIAGNOSIS — E1169 Type 2 diabetes mellitus with other specified complication: Secondary | ICD-10-CM | POA: Diagnosis present

## 2019-07-13 DIAGNOSIS — Z89429 Acquired absence of other toe(s), unspecified side: Secondary | ICD-10-CM

## 2019-07-13 DIAGNOSIS — F17210 Nicotine dependence, cigarettes, uncomplicated: Secondary | ICD-10-CM | POA: Diagnosis present

## 2019-07-13 DIAGNOSIS — M8610 Other acute osteomyelitis, unspecified site: Secondary | ICD-10-CM

## 2019-07-13 DIAGNOSIS — M797 Fibromyalgia: Secondary | ICD-10-CM | POA: Diagnosis present

## 2019-07-13 DIAGNOSIS — N189 Chronic kidney disease, unspecified: Secondary | ICD-10-CM | POA: Diagnosis not present

## 2019-07-13 DIAGNOSIS — Z79899 Other long term (current) drug therapy: Secondary | ICD-10-CM | POA: Diagnosis not present

## 2019-07-13 DIAGNOSIS — M8669 Other chronic osteomyelitis, multiple sites: Secondary | ICD-10-CM | POA: Diagnosis not present

## 2019-07-13 DIAGNOSIS — Z8619 Personal history of other infectious and parasitic diseases: Secondary | ICD-10-CM | POA: Diagnosis not present

## 2019-07-13 DIAGNOSIS — Z886 Allergy status to analgesic agent status: Secondary | ICD-10-CM | POA: Diagnosis not present

## 2019-07-13 DIAGNOSIS — M869 Osteomyelitis, unspecified: Secondary | ICD-10-CM | POA: Diagnosis present

## 2019-07-13 DIAGNOSIS — E11610 Type 2 diabetes mellitus with diabetic neuropathic arthropathy: Secondary | ICD-10-CM | POA: Diagnosis not present

## 2019-07-13 DIAGNOSIS — Z8614 Personal history of Methicillin resistant Staphylococcus aureus infection: Secondary | ICD-10-CM | POA: Diagnosis not present

## 2019-07-13 LAB — BASIC METABOLIC PANEL
Anion gap: 10 (ref 5–15)
BUN: 23 mg/dL — ABNORMAL HIGH (ref 6–20)
CO2: 24 mmol/L (ref 22–32)
Calcium: 9.3 mg/dL (ref 8.9–10.3)
Chloride: 103 mmol/L (ref 98–111)
Creatinine, Ser: 1.8 mg/dL — ABNORMAL HIGH (ref 0.44–1.00)
GFR calc Af Amer: 36 mL/min — ABNORMAL LOW (ref >60)
GFR calc non Af Amer: 31 mL/min — ABNORMAL LOW (ref >60)
Glucose, Bld: 192 mg/dL — ABNORMAL HIGH (ref 70–99)
Potassium: 4.1 mmol/L (ref 3.5–5.1)
Sodium: 137 mmol/L (ref 135–145)

## 2019-07-13 LAB — LACTIC ACID, PLASMA
Lactic Acid, Venous: 1.3 mmol/L (ref 0.5–1.9)
Lactic Acid, Venous: 1.4 mmol/L (ref 0.5–1.9)

## 2019-07-13 LAB — CBC WITH DIFFERENTIAL/PLATELET
Abs Immature Granulocytes: 0.07 10*3/uL (ref 0.00–0.07)
Basophils Absolute: 0 10*3/uL (ref 0.0–0.1)
Basophils Relative: 0 %
Eosinophils Absolute: 0.1 10*3/uL (ref 0.0–0.5)
Eosinophils Relative: 1 %
HCT: 32.1 % — ABNORMAL LOW (ref 36.0–46.0)
Hemoglobin: 10.3 g/dL — ABNORMAL LOW (ref 12.0–15.0)
Immature Granulocytes: 1 %
Lymphocytes Relative: 7 %
Lymphs Abs: 0.8 10*3/uL (ref 0.7–4.0)
MCH: 27 pg (ref 26.0–34.0)
MCHC: 32.1 g/dL (ref 30.0–36.0)
MCV: 84.3 fL (ref 80.0–100.0)
Monocytes Absolute: 0.8 10*3/uL (ref 0.1–1.0)
Monocytes Relative: 7 %
Neutro Abs: 8.8 10*3/uL — ABNORMAL HIGH (ref 1.7–7.7)
Neutrophils Relative %: 84 %
Platelets: 262 10*3/uL (ref 150–400)
RBC: 3.81 MIL/uL — ABNORMAL LOW (ref 3.87–5.11)
RDW: 14.2 % (ref 11.5–15.5)
WBC: 10.5 10*3/uL (ref 4.0–10.5)
nRBC: 0 % (ref 0.0–0.2)

## 2019-07-13 LAB — HEMOGLOBIN A1C
Hgb A1c MFr Bld: 6.2 % — ABNORMAL HIGH (ref 4.8–5.6)
Mean Plasma Glucose: 131.24 mg/dL

## 2019-07-13 LAB — GLUCOSE, CAPILLARY
Glucose-Capillary: 138 mg/dL — ABNORMAL HIGH (ref 70–99)
Glucose-Capillary: 107 mg/dL — ABNORMAL HIGH (ref 70–99)

## 2019-07-13 LAB — SARS CORONAVIRUS 2 (TAT 6-24 HRS): SARS Coronavirus 2: NEGATIVE

## 2019-07-13 LAB — SEDIMENTATION RATE: Sed Rate: 117 mm/hr — ABNORMAL HIGH (ref 0–30)

## 2019-07-13 LAB — C-REACTIVE PROTEIN: CRP: 21.1 mg/dL — ABNORMAL HIGH (ref <1.0)

## 2019-07-13 MED ORDER — SODIUM CHLORIDE 0.9 % IV SOLN
2.0000 g | Freq: Two times a day (BID) | INTRAVENOUS | Status: DC
  Administered 2019-07-14 – 2019-07-15 (×3): 2 g via INTRAVENOUS
  Filled 2019-07-13 (×4): qty 2

## 2019-07-13 MED ORDER — SODIUM CHLORIDE 0.9 % IV BOLUS
500.0000 mL | Freq: Once | INTRAVENOUS | Status: AC
  Administered 2019-07-13: 500 mL via INTRAVENOUS

## 2019-07-13 MED ORDER — ACETAMINOPHEN 650 MG RE SUPP: 650.0000 mg | Freq: Four times a day (QID) | RECTAL | Status: DC | PRN

## 2019-07-13 MED ORDER — OXYCODONE HCL 5 MG PO TABS
5.0000 mg | ORAL_TABLET | Freq: Four times a day (QID) | ORAL | Status: DC | PRN
  Administered 2019-07-14: 5 mg via ORAL
  Filled 2019-07-13: qty 1

## 2019-07-13 MED ORDER — INSULIN ASPART 100 UNIT/ML ~~LOC~~ SOLN: 0.0000 [IU] | Freq: Every day | SUBCUTANEOUS | Status: DC

## 2019-07-13 MED ORDER — ENOXAPARIN SODIUM 40 MG/0.4ML ~~LOC~~ SOLN
40.0000 mg | SUBCUTANEOUS | Status: DC
  Administered 2019-07-13: 40 mg via SUBCUTANEOUS
  Filled 2019-07-13 (×2): qty 0.4

## 2019-07-13 MED ORDER — ONDANSETRON HCL 4 MG/2ML IJ SOLN: 4.0000 mg | Freq: Four times a day (QID) | INTRAMUSCULAR | Status: DC | PRN

## 2019-07-13 MED ORDER — ONDANSETRON HCL 4 MG PO TABS: 4.0000 mg | ORAL_TABLET | Freq: Four times a day (QID) | ORAL | Status: DC | PRN

## 2019-07-13 MED ORDER — NICOTINE 7 MG/24HR TD PT24
7.0000 mg | MEDICATED_PATCH | Freq: Every day | TRANSDERMAL | Status: DC
  Administered 2019-07-14: 7 mg via TRANSDERMAL
  Filled 2019-07-13 (×3): qty 1

## 2019-07-13 MED ORDER — GABAPENTIN 400 MG PO CAPS
1600.0000 mg | ORAL_CAPSULE | Freq: Every day | ORAL | Status: DC
  Administered 2019-07-13 – 2019-07-14 (×2): 1600 mg via ORAL
  Filled 2019-07-13 (×2): qty 4

## 2019-07-13 MED ORDER — ACETAMINOPHEN 325 MG PO TABS
975.0000 mg | ORAL_TABLET | Freq: Once | ORAL | Status: AC
  Administered 2019-07-13: 975 mg via ORAL
  Filled 2019-07-13: qty 3

## 2019-07-13 MED ORDER — INSULIN ASPART 100 UNIT/ML ~~LOC~~ SOLN
0.0000 [IU] | Freq: Three times a day (TID) | SUBCUTANEOUS | Status: DC
  Administered 2019-07-13 – 2019-07-14 (×2): 1 [IU] via SUBCUTANEOUS
  Filled 2019-07-13 (×3): qty 1

## 2019-07-13 MED ORDER — LORAZEPAM 2 MG/ML IJ SOLN: 0.5000 mg | Freq: Once | INTRAMUSCULAR | Status: DC | PRN

## 2019-07-13 MED ORDER — ACETAMINOPHEN 325 MG PO TABS
650.0000 mg | ORAL_TABLET | Freq: Four times a day (QID) | ORAL | Status: DC | PRN
  Administered 2019-07-13 – 2019-07-14 (×3): 650 mg via ORAL
  Filled 2019-07-13 (×5): qty 2

## 2019-07-13 MED ORDER — AMITRIPTYLINE HCL 50 MG PO TABS
50.0000 mg | ORAL_TABLET | Freq: Every day | ORAL | Status: DC
  Administered 2019-07-13 – 2019-07-14 (×2): 50 mg via ORAL
  Filled 2019-07-13: qty 1
  Filled 2019-07-13: qty 2
  Filled 2019-07-13 (×2): qty 1
  Filled 2019-07-13: qty 2

## 2019-07-13 MED ORDER — VANCOMYCIN HCL IN DEXTROSE 750-5 MG/150ML-% IV SOLN
750.0000 mg | INTRAVENOUS | Status: DC
  Administered 2019-07-14: 750 mg via INTRAVENOUS
  Filled 2019-07-13 (×2): qty 150

## 2019-07-13 MED ORDER — SODIUM CHLORIDE 0.9 % IV SOLN
INTRAVENOUS | Status: DC
  Administered 2019-07-13: 18:00:00 via INTRAVENOUS

## 2019-07-13 MED ORDER — ACETAMINOPHEN 500 MG PO TABS
1000.0000 mg | ORAL_TABLET | Freq: Once | ORAL | Status: AC
  Administered 2019-07-13: 1000 mg via ORAL
  Filled 2019-07-13: qty 2

## 2019-07-13 MED ORDER — SODIUM CHLORIDE 0.9 % IV SOLN
2.0000 g | Freq: Once | INTRAVENOUS | Status: AC
  Administered 2019-07-13: 2 g via INTRAVENOUS
  Filled 2019-07-13: qty 2

## 2019-07-13 MED ORDER — VANCOMYCIN HCL 10 G IV SOLR
2000.0000 mg | Freq: Once | INTRAVENOUS | Status: AC
  Administered 2019-07-13: 2000 mg via INTRAVENOUS
  Filled 2019-07-13: qty 2000

## 2019-07-13 NOTE — H&P (Addendum)
[facility] [place] [facility]   PATIENT NAME: Jackie MesiCathy Miles    MR#:  [id number]  DATE OF BIRTH:  [birth year]  DATE OF ADMISSION:  07/13/2019  PRIMARY CARE PHYSICIAN: Patient, No Pcp Per   REQUESTING/REFERRING PHYSICIAN: Dr Paschal DoppSarah Monks  CHIEF COMPLAINT:   Chief Complaint  Patient presents with  . Foot Pain  . Wound Check    HISTORY OF PRESENT ILLNESS:  Jackie MesiCathy Glasheen  is a 53 y.o. female states a few days ago she took a shower in the shower that she should not have taken a shower and and has not been feeling right since.  She has some right leg pain and back of her leg.  She has had fever and some sweats.  She has been feeling weak.  A couple years ago she did have an infection in that right midfoot consistent with a Charcot joint.  The patient is adamant that she does not want any further surgical procedures.  In the ER, x-ray consistent with chronic osteomyelitis.  Sedimentation rate very high.  Hospitalist services contacted for further evaluation.  PAST MEDICAL HISTORY:   Past Medical History:  Diagnosis Date  . Asthma   . Diabetes mellitus without complication (HCC)   . Fibromyalgia   . H/O percutaneous left heart catheterization   . HLD (hyperlipidemia)   . Hypertension   . Shingles     PAST SURGICAL HISTORY:   Past Surgical History:  Procedure Laterality Date  . toe amputation      SOCIAL HISTORY:   Social History   Tobacco Use  . Smoking status: Current Every Day Smoker    Packs/day: 0.25  . Smokeless tobacco: Never Used  Substance Use Topics  . Alcohol use: No    FAMILY HISTORY:   Family History  Problem Relation Age of Onset  . Diabetes Mother   . Diabetes Sister   . CAD Father   . Cancer Father     DRUG ALLERGIES:   Allergies  Allergen Reactions  . Ibuprofen Other (See Comments)    Reaction:  Acid reflux, GI bleeding    REVIEW OF SYSTEMS:  CONSTITUTIONAL: Positive for fever, chills and sweats.  Positive for  fatigue and weakness.  EYES: No blurred or double vision.  EARS, NOSE, AND THROAT: No tinnitus or ear pain. No sore throat RESPIRATORY: No cough, shortness of breath, wheezing or hemoptysis.  CARDIOVASCULAR: No chest pain, orthopnea, edema.  GASTROINTESTINAL: No nausea, vomiting, diarrhea or abdominal pain. No blood in bowel movements GENITOURINARY: No dysuria, hematuria.  ENDOCRINE: No polyuria, nocturia,  HEMATOLOGY: No anemia, easy bruising or bleeding SKIN: No rash or lesion. MUSCULOSKELETAL: Positive for right leg pain and joint pain NEUROLOGIC: No tingling, numbness, weakness.  PSYCHIATRY: No anxiety or depression.   MEDICATIONS AT HOME:   Prior to Admission medications   Medication Sig Start Date End Date Taking? Authorizing Provider  amitriptyline (ELAVIL) 100 MG tablet Take 1 tablet (100 mg total) by mouth at bedtime. 11/06/18  Yes Pucilowska, Jolanta B, MD  gabapentin (NEURONTIN) 600 MG tablet Take 1 tablet (600 mg total) by mouth 2 (two) times daily. Patient taking differently: Take 800 mg by mouth 3 (three) times daily.  11/06/18  Yes Pucilowska, Jolanta B, MD  oxyCODONE-acetaminophen (PERCOCET/ROXICET) 5-325 MG tablet Take 1 tablet by mouth every 4 (four) hours as needed for severe pain.   Yes [provider]      VITAL SIGNS:  Blood pressure (!) 129/53, pulse 96, temperature 100.3  F (37.9 C), temperature source Oral, resp. rate 18, height 5\' 8"  (1.727 m), weight 90.7 kg, SpO2 98 %.  PHYSICAL EXAMINATION:  GENERAL:  53 y.o.-year-old patient lying in the bed with no acute distress.  EYES: Pupils equal, round, reactive to light and accommodation. No scleral icterus. Extraocular muscles intact.  HEENT: Head atraumatic, normocephalic. Oropharynx and nasopharynx clear.  NECK:  Supple, no jugular venous distention. No thyroid enlargement, no tenderness.  LUNGS: Normal breath sounds bilaterally, no wheezing, rales,rhonchi or crepitation. No use of accessory muscles of  respiration.  CARDIOVASCULAR: S1, S2 normal. No murmurs, rubs, or gallops.  ABDOMEN: Soft, nontender, nondistended. Bowel sounds present. No organomegaly or mass.  EXTREMITIES: No pedal edema, cyanosis, or clubbing.  NEUROLOGIC: Cranial nerves II through XII are intact. Muscle strength 5/5 in all extremities. Sensation intact. Gait not checked.  PSYCHIATRIC: The patient is alert and oriented x 3.  SKIN: Right leg chronic discoloration.  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  No open wounds.  Increased warmth to touch on the back of the right leg and pain to palpation.  LABORATORY PANEL:   CBC Recent Labs  Lab 07/13/19 1437  WBC 10.5  HGB 10.3*  HCT 32.1*  PLT 262   ------------------------------------------------------------------------------------------------------------------  Chemistries  Recent Labs  Lab 07/13/19 1437  NA 137  K 4.1  CL 103  CO2 24  GLUCOSE 192*  BUN 23*  CREATININE 1.80*  CALCIUM 9.3   ------------------------------------------------------------------------------------------------------------------    RADIOLOGY:  Dg Ankle Complete Right  Result Date: 07/13/2019 CLINICAL DATA:  Worsening wound outside of right foot. Drainage for 3 days. Fracture 7 years ago. 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 EXAM: RIGHT ANKLE - COMPLETE 3+ VIEW COMPARISON:  11/20/2018 FINDINGS: Diffuse soft tissue swelling, compared to the February exam. Chronic diffuse cortical thickening and altered osseous morphology involving the distal tibia and fibula. Presumed osteolysis about the talus. No well-defined soft tissue ulcer. No interval osseous destruction. No soft tissue gas. Presumed surgical deformities of the fourth and fifth digits. 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 Superimposed chronic osteomyelitis cannot be excluded. Improved soft tissue swelling without well-defined  soft tissue ulcer or acute osseous destruction. Electronically Signed   By: Abigail Miyamoto M.D.   On: 07/13/2019 14:57    EKG:   Sinus arrhythmia 98 bpm.  I do not agree with the atrial fibrillation reading.  IMPRESSION AND PLAN:   1.  Cellulitis right lower extremity with chronic osteomyelitis right ankle, with fever.  MRI right ankle.  Patient is adamant that she does not want any surgical intervention.  We will get infectious disease consultation for duration of antibiotics.  Will start vancomycin and cefepime at this time. 2.  Pain right calf.  We will get an ultrasound of the right lower extremity to rule out DVT. 3.  Fibromyalgia on Neurontin at night and amitriptyline 4.  Chronic kidney disease stage III.  Gentle fluids.  Watch creatinine with antibiotics. 5.  Type 2 diabetes with Impaired fasting glucose.  Check a hemoglobin A1c and place on sliding scale 6.  Tobacco abuse smoking cessation counseling done 4 minutes by me.  Nicotine patch applied    All the records are reviewed and case discussed with ED provider. Management plans discussed with the patient, family and they are in agreement.  CODE STATUS: Full code  TOTAL TIME TAKING CARE OF THIS PATIENT: 50 minutes.    Shavonna Corella [employer] M.D  on 07/13/2019 at 4:45 PM  Between 7am to 6pm - Pager - [phone]  After 6pm call admission pager [phone]  [facility]  [phone]  CC: Primary care physician; Patient, No Pcp Per

## 2019-07-13 NOTE — ED Notes (Signed)
X-ray at bedside

## 2019-07-13 NOTE — Progress Notes (Signed)
Pharmacy Antibiotic Note  Jackie Miles is a 53 y.o. female admitted on 07/13/2019 with concern for osteomyelitis. Patient with chronic wound, refusing amputation for quite some time. Reports that she has an appointment with a surgeon to have an amputation as soon as possible. Pharmacy has been consulted for vancomycin and cefepime dosing for osteomyelitis.  Plan: Vancomycin 2000 mg IV x 1 followed by 750 mg IV q24h to start tomorrow at 1800 Goal AUC 400-550 Expected AUC: 487 SCr used: 1.8  Patient's SCr appears to be elevated above baseline 1.4 to 1.6, although do not have a recent SCr for several months. Will proceed with scheduled dose as above and adjust as indicated based off BMP tomorrow morning.  Cefepime 2 g IV q12h   Height: 5\' 8"  (172.7 cm) Weight: 200 lb (90.7 kg) IBW/kg (Calculated) : 63.9  Temp (24hrs), Avg:100.2 F (37.9 C), Min:100 F (37.8 C), Max:100.3 F (37.9 C)  Recent Labs  Lab 07/13/19 1437  WBC 10.5  CREATININE 1.80*    Estimated Creatinine Clearance: 41.6 mL/min (A) (by C-G formula based on SCr of 1.8 mg/dL (H)).    Allergies  Allergen Reactions  . Ibuprofen Other (See Comments)    Reaction:  Acid reflux, GI bleeding    Antimicrobials this admission: Vancomycin 10/13 >> Cefepime 10/13 >>  Dose adjustments this admission: NA  Microbiology results: 10/13 BCx: pending   Thank you for allowing pharmacy to be a part of this patient's care.  Tawnya Crook, PharmD 07/13/2019 4:44 PM

## 2019-07-13 NOTE — Progress Notes (Signed)
PHARMACY -  BRIEF ANTIBIOTIC NOTE   Pharmacy has received consult(s) for vancomycin from an ED provider.  The patient's profile has been reviewed for ht/wt/allergies/indication/available labs.    One time order(s) placed for vancomycin 2 g  Further antibiotics/pharmacy consults should be ordered by admitting physician if indicated.                       Thank you,  Tawnya Crook, PharmD 07/13/2019  4:11 PM

## 2019-07-13 NOTE — ED Notes (Addendum)
MD at bedside to update patient. Pt verbalized she does not want admission if it is to talk to orthopedic surgeons about amputation. Pt reports she has a Psychologist, sport and exercise already scheduled to amputate as soon as possible.

## 2019-07-13 NOTE — ED Provider Notes (Signed)
[facility] Emergency Department Provider Note  ____________________________________________   First MD Initiated Contact with Patient 07/13/19 1418     (approximate)  I have reviewed the triage vital signs and the nursing notes.  History  Chief Complaint Foot Pain and Wound Check    HPI Jackie Miles is a 53 y.o. female with history of HTN, DM, diabetic foot ulcers w/ history of osteomyelitis (per chart review - has previously refused surgical intervention multiple times, was recommended amputation at [facility]), s/p amputation of the R 4th and 5th toe.  She presents to the ED with concern for newly developing wound to the R lateral ankle, over the lateral malleolus. She denies any new injury or trauma. She thinks this wound may have started because she showered in a family member's shower that was not adequately sanitized. She has had small amount of drainage and surrounding swelling, but not as severe as prior infections per her report. She denies any fevers or chills.   She is primarily wheelchair bound due to her issues with her RLE. She keeps a boot on her RLE to avoid any injury during wheelchair manipulation or transfers.   On chart review, it does appear she has a history of MRSA as well as pseudomonas.    Past Medical Hx Past Medical History:  Diagnosis Date  . Asthma   . Diabetes mellitus without complication ([place])   . Fibromyalgia   . H/O percutaneous left heart catheterization   . HLD (hyperlipidemia)   . Hypertension   . Shingles     Problem List Patient Active Problem List   Diagnosis Date Noted  . Non-healing ulcer of right foot ([place]) 11/20/2018  . Tobacco use disorder 11/06/2018  . Delusional disorder ([place]) 11/04/2018  . Cellulitis 09/07/2017  . HTN (hypertension) 05/28/2017  . Asthma 05/28/2017  . HLD (hyperlipidemia) 05/28/2017  . AKI (acute kidney injury) ([place]) 05/28/2017  . 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 layer exposed  ([place])   . Cellulitis of foot, right   . Pressure injury of skin 02/14/2017  . Osteomyelitis ([place]) 02/21/2016  . Foot infection 01/22/2016  . Sepsis ([place]) 01/12/2016    Past Surgical Hx Past Surgical History:  Procedure Laterality Date  . toe amputation      Medications Prior to Admission medications   Medication Sig Start Date End Date Taking? Authorizing Provider  amitriptyline (ELAVIL) 100 MG tablet Take 1 tablet (100 mg total) by mouth at bedtime. 11/06/18   Pucilowska, Herma Ard B, MD  gabapentin (NEURONTIN) 600 MG tablet Take 1 tablet (600 mg total) by mouth 2 (two) times daily. Patient taking differently: Take 800 mg by mouth 3 (three) times daily.  11/06/18   Pucilowska, Wardell Honour, MD  HYDROcodone-acetaminophen (NORCO/VICODIN) 5-325 MG tablet 1-2 tab po qd prn 12/04/18   Norval Gable, MD  metFORMIN (GLUCOPHAGE) 1000 MG tablet Take 1 tablet (1,000 mg total) by mouth 2 (two) times daily with a meal. 11/06/18   Pucilowska, Jolanta B, MD  mupirocin cream (BACTROBAN) 2 % Apply topically daily. 11/07/18   Pucilowska, Herma Ard B, MD  sulfamethoxazole-trimethoprim (BACTRIM DS,SEPTRA DS) 800-160 MG tablet Take 1 tablet by mouth 2 (two) times daily. 12/04/18   Norval Gable, MD    Allergies Ibuprofen  Family Hx Family History  Problem Relation Age of Onset  . Diabetes Mother   . Diabetes Sister     Social Hx Social History   Tobacco Use  . Smoking status: Current Every Day Smoker  Packs/day: 0.25  . Smokeless tobacco: Never Used  Substance Use Topics  . Alcohol use: No  . Drug use: No     Review of Systems  Constitutional: Negative for fever, chills. Eyes: Negative for visual changes. ENT: Negative for sore throat. Cardiovascular: Negative for chest pain. Respiratory: Negative for shortness of breath. Gastrointestinal: Negative for nausea, vomiting.  Genitourinary: Negative for dysuria. Musculoskeletal: + ankle swelling Skin: + wound Neurological: Negative for for  headaches.   Physical Exam  Vital Signs: ED Triage Vitals  Enc Vitals Group     BP 07/13/19 1411 121/71     Pulse Rate 07/13/19 1411 100     Resp 07/13/19 1411 18     Temp 07/13/19 1411 100 F (37.8 C)     Temp Source 07/13/19 1411 Oral     SpO2 07/13/19 1411 100 %     Weight 07/13/19 1412 200 lb (90.7 kg)     Height 07/13/19 1412 _0  (1.727 m)     Head Circumference --      Peak Flow --      Pain Score 07/13/19 1412 8     Pain Loc --      Pain Edu? --      Excl. in [place]? --     Constitutional: Alert and oriented.  Head: Normocephalic. Atraumatic. Eyes: Conjunctivae clear. Sclera anicteric. Nose: No congestion. No rhinorrhea. Mouth/Throat: Mucous membranes are moist.  Neck: No stridor.   Cardiovascular: Normal rate. Extremities well perfused.  Toes are warm and well perfused.  2+ symmetric DP pulses by palpation. Respiratory: Normal respiratory effort.  Gastrointestinal: Non-distended.  Musculoskeletal: Moderate swelling about the dorsum and ankle on the right.  Able to dorsiflex, plantar flex, and wiggle toes without issue.  Compartments are soft and compressible.  No palpable crepitance. Neurologic:  Normal speech and language. No gross focal neurologic deficits are appreciated.  Skin:  2 x 2 cm wound overlying the right lateral malleolus, no significant erythema or surrounding warmth.  Small amount of drainage. Psychiatric: Mood and affect are appropriate for situation.  EKG  Personally reviewed.   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 Superimposed chronic osteomyelitis  cannot be excluded. Improved soft tissue swelling without  well-defined soft tissue ulcer or acute osseous destruction.    Procedures  Procedure(s) performed (including critical care):   Procedures   Initial Impression / Assessment and Plan / ED Course  53 y.o. female who presents to the ED who presents for a wound over the right lateral malleolus, which is been present for the last 3 days.  Patient with a complex medical history, as noted above.  Ddx: diabetic wound, acute on chronic osteomyelitis  Plan: labs, imaging, XR  Patient temperature now 100.3. Her ESR is significantly elevated at 117. No leukocytosis but left shift. Slightly increased Cr from baseline. Discussed work up with the patient including the need for antibiotics and likely surgical intervention. At this time she is not willing to discuss surgery at all, but she is willing to be admitted for IV antibiotics. Will give vancomycin and cefepime, draw cultures/lactate and discuss with hospitalist for admission.   Final Clinical Impression(s) / ED Diagnosis  Final diagnoses:  Diabetic foot infection ([place])  Acute on chronic osteomyelitis ([place])  Note:  This document was prepared using Dragon voice recognition software and may include unintentional dictation errors.   Lilia Pro., MD 07/13/19 [phone]

## 2019-07-13 NOTE — ED Triage Notes (Signed)
PT to ED via EMS from home reporting worsening wound on the outside of right foot. Pt reports she has been having drainage and worsening pain x 3 days. Hx of breaking right foot 7 years ago without surgical intervention. Pt has been in wheelchair for the past 7 years with multiple toe amputations due to Diabetes. Swelling noted to right foot but pt reports the swelling is better than it has been in the past.

## 2019-07-13 NOTE — ED Notes (Signed)
MD at bedside to assess wound. MD has reapplied dressing and ACE wrap.

## 2019-07-13 NOTE — ED Notes (Signed)
Pt shivering under numerous blankets and reporting she can not get warm. Oral temp checked and pt is 100.3. MD made aware.

## 2019-07-14 ENCOUNTER — Inpatient Hospital Stay: Payer: Medicaid Other

## 2019-07-14 DIAGNOSIS — Z87311 Personal history of (healed) other pathological fracture: Secondary | ICD-10-CM

## 2019-07-14 DIAGNOSIS — E1122 Type 2 diabetes mellitus with diabetic chronic kidney disease: Secondary | ICD-10-CM

## 2019-07-14 DIAGNOSIS — Z8619 Personal history of other infectious and parasitic diseases: Secondary | ICD-10-CM

## 2019-07-14 DIAGNOSIS — Z8614 Personal history of Methicillin resistant Staphylococcus aureus infection: Secondary | ICD-10-CM

## 2019-07-14 DIAGNOSIS — N189 Chronic kidney disease, unspecified: Secondary | ICD-10-CM

## 2019-07-14 DIAGNOSIS — M8669 Other chronic osteomyelitis, multiple sites: Secondary | ICD-10-CM

## 2019-07-14 DIAGNOSIS — E1169 Type 2 diabetes mellitus with other specified complication: Principal | ICD-10-CM

## 2019-07-14 DIAGNOSIS — F17210 Nicotine dependence, cigarettes, uncomplicated: Secondary | ICD-10-CM

## 2019-07-14 DIAGNOSIS — E11610 Type 2 diabetes mellitus with diabetic neuropathic arthropathy: Secondary | ICD-10-CM

## 2019-07-14 DIAGNOSIS — Z886 Allergy status to analgesic agent status: Secondary | ICD-10-CM

## 2019-07-14 LAB — CBC
HCT: 27.3 % — ABNORMAL LOW (ref 36.0–46.0)
Hemoglobin: 9.1 g/dL — ABNORMAL LOW (ref 12.0–15.0)
MCH: 30 pg (ref 26.0–34.0)
MCHC: 33.3 g/dL (ref 30.0–36.0)
MCV: 90.1 fL (ref 80.0–100.0)
Platelets: 231 10*3/uL (ref 150–400)
RBC: 3.03 MIL/uL — ABNORMAL LOW (ref 3.87–5.11)
RDW: 16.3 % — ABNORMAL HIGH (ref 11.5–15.5)
WBC: 5.8 10*3/uL (ref 4.0–10.5)
nRBC: 0 % (ref 0.0–0.2)

## 2019-07-14 LAB — GLUCOSE, CAPILLARY
Glucose-Capillary: 125 mg/dL — ABNORMAL HIGH (ref 70–99)
Glucose-Capillary: 132 mg/dL — ABNORMAL HIGH (ref 70–99)
Glucose-Capillary: 102 mg/dL — ABNORMAL HIGH (ref 70–99)
Glucose-Capillary: 117 mg/dL — ABNORMAL HIGH (ref 70–99)

## 2019-07-14 LAB — BASIC METABOLIC PANEL
Anion gap: 9 (ref 5–15)
BUN: 20 mg/dL (ref 6–20)
CO2: 20 mmol/L — ABNORMAL LOW (ref 22–32)
Calcium: 8.6 mg/dL — ABNORMAL LOW (ref 8.9–10.3)
Chloride: 110 mmol/L (ref 98–111)
Creatinine, Ser: 1.63 mg/dL — ABNORMAL HIGH (ref 0.44–1.00)
GFR calc Af Amer: 41 mL/min — ABNORMAL LOW (ref >60)
GFR calc non Af Amer: 35 mL/min — ABNORMAL LOW (ref >60)
Glucose, Bld: 122 mg/dL — ABNORMAL HIGH (ref 70–99)
Potassium: 3.8 mmol/L (ref 3.5–5.1)
Sodium: 139 mmol/L (ref 135–145)

## 2019-07-14 LAB — HIV ANTIBODY (ROUTINE TESTING W REFLEX): HIV Screen 4th Generation wRfx: NONREACTIVE

## 2019-07-14 MED ORDER — SODIUM CHLORIDE 0.9 % IV BOLUS
500.0000 mL | Freq: Once | INTRAVENOUS | Status: AC
  Administered 2019-07-14: 500 mL via INTRAVENOUS

## 2019-07-14 NOTE — Consult Note (Addendum)
NAME: Jackie Miles  DOB: 01-Jun-1966  MRN: [id number]  Date/Time: 07/14/2019 3:51 PM  REQUESTING PROVIDER: Hilton Sinclair Subjective:  REASON FOR CONSULT: chronic ulcer foot with osteo ? Jackie Miles is a 53 y.o. female with a history of diabetes mellitus, right Charcot foot, chronic osteomyelitis and infection since 2017 chronic infectionof the foot since 2017 and has been treated with many courses of antibiotic, recommended BKA but refused presents with pain right calf area and drainage from the right foot.  Patient also has a history of fracture right foot without any surgical intervention.  She has been followed by multiple podiatrist both here at [facility] and at [facility].  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  Effusion at the ankle joint with a sinus tract extending to the lateral skin surface.  There is chronic osteomyelitis of the distal tibia and fibula and also talus and calcaneus. Her labs in the ED showed a WBC of 10.5, hemoglobin of 10.3, platelet of 262, creatinine of 1.80.  Blood cultures were sent.  She has been started on vancomycin and cefepime.  She had a temperature of up to 102.7 here. I am asked to see the patient for the same.  She has been treated for infection in the past and the last in 2019 was MRSA from the wound culture she is had Pseudomonas before that she has been on multiple course of antibiotics. Past Medical History:  Diagnosis Date  . Asthma   . Diabetes mellitus without complication (H[facility])   . Fibromyalgia   . H/O percutaneous left heart catheterization   . HLD (hyperlipidemia)   . Hypertension   . Shingles     Past Surgical History:  Procedure Laterality Date  . toe amputation      Social History   Socioeconomic History  . Marital status: Single    Spouse name: Not on file  . Number of children: Not on file  . Years of education: Not on file  . Highest education level: Not on file  Occupational  History  . Not on file  Social Needs  . Financial resource strain: Not on file  . Food insecurity    Worry: Not on file    Inability: Not on file  . Transportation needs    Medical: Not on file    Non-medical: Not on file  Tobacco Use  . Smoking status: Current Every Day Smoker    Packs/day: 0.25  . Smokeless tobacco: Never Used  Substance and Sexual Activity  . Alcohol use: No  . Drug use: No  . Sexual activity: Not on file  Lifestyle  . Physical activity    Days per week: Not on file    Minutes per session: Not on file  . Stress: Not on file  Relationships  . Social Musician on phone: Not on file    Gets together: Not on file    Attends religious service: Not on file    Active member of club or organization: Not on file    Attends meetings of clubs or organizations: Not on file    Relationship status: Not on file  . Intimate partner violence    Fear of current or ex partner: Not on file    Emotionally abused: Not on file    Physically abused: Not on file    Forced sexual activity: Not on file  Other Topics Concern  . Not on  file  Social History Narrative  . Not on file    Family History  Problem Relation Age of Onset  . Diabetes Mother   . Diabetes Sister   . CAD Father   . Cancer Father    Allergies  Allergen Reactions  . Ibuprofen Other (See Comments)    Reaction:  Acid reflux, GI bleeding   I Current Facility-Administered Medications  Medication Dose Route Frequency Provider Last Rate Last Dose  . 0.9 %  sodium chloride infusion   Intravenous Continuous Alford Highland, MD 50 mL/hr at 07/13/19 1811    . acetaminophen (TYLENOL) tablet 650 mg  650 mg Oral Q6H PRN Alford Highland, MD   650 mg at 07/14/19 0428   Or  . acetaminophen (TYLENOL) suppository 650 mg  650 mg Rectal Q6H PRN Alford Highland, MD      . amitriptyline (ELAVIL) tablet 50 mg  50 mg Oral QHS Alford Highland, MD   50 mg at 07/13/19 2138  . ceFEPIme (MAXIPIME) 2 g in  sodium chloride 0.9 % 100 mL IVPB  2 g Intravenous Q12H Alford Highland, MD 200 mL/hr at 07/14/19 0431 2 g at 07/14/19 0431  . enoxaparin (LOVENOX) injection 40 mg  40 mg Subcutaneous Q24H Alford Highland, MD   40 mg at 07/13/19 2139  . gabapentin (NEURONTIN) capsule 1,600 mg  1,600 mg Oral QHS Alford Highland, MD   1,600 mg at 07/13/19 2138  . insulin aspart (novoLOG) injection 0-5 Units  0-5 Units Subcutaneous QHS Wieting, Richard, MD      . insulin aspart (novoLOG) injection 0-9 Units  0-9 Units Subcutaneous TID WC Alford Highland, MD   1 Units at 07/14/19 [phone]  . LORazepam (ATIVAN) injection 0.5 mg  0.5 mg Intravenous Once PRN Alford Highland, MD      . nicotine (NICODERM CQ - dosed in mg/24 hr) patch 7 mg  7 mg Transdermal Daily Alford Highland, MD   7 mg at 07/14/19 9604  . ondansetron (ZOFRAN) tablet 4 mg  4 mg Oral Q6H PRN Alford Highland, MD       Or  . ondansetron (ZOFRAN) injection 4 mg  4 mg Intravenous Q6H PRN Wieting, Richard, MD      . oxyCODONE (Oxy IR/ROXICODONE) immediate release tablet 5 mg  5 mg Oral Q6H PRN Wieting, Richard, MD      . vancomycin (VANCOCIN) IVPB 750 mg/150 ml premix  750 mg Intravenous Q24H Wieting, Richard, MD         Abtx:  Anti-infectives (From admission, onward)   Start     Dose/Rate Route Frequency Ordered Stop   07/14/19 1800  vancomycin (VANCOCIN) IVPB 750 mg/150 ml premix     750 mg 150 mL/hr over 60 Minutes Intravenous Every 24 hours 07/13/19 1643     07/14/19 0400  ceFEPIme (MAXIPIME) 2 g in sodium chloride 0.9 % 100 mL IVPB     2 g 200 mL/hr over 30 Minutes Intravenous Every 12 hours 07/13/19 1643     07/13/19 1700  vancomycin (VANCOCIN) 2,000 mg in sodium chloride 0.9 % 500 mL IVPB     2,000 mg 250 mL/hr over 120 Minutes Intravenous  Once 07/13/19 1611 07/13/19 1831   07/13/19 1600  ceFEPIme (MAXIPIME) 2 g in sodium chloride 0.9 % 100 mL IVPB     2 g 200 mL/hr over 30 Minutes Intravenous  Once 07/13/19 1559 07/13/19 1708       REVIEW OF SYSTEMS:  Const: Positive fever, negative chills, negative weight  loss Eyes: negative diplopia or visual changes, negative eye pain ENT: negative coryza, negative sore throat Resp: negative cough, hemoptysis, dyspnea Cards: negative for chest pain, palpitations, lower extremity edema GU: negative for frequency, dysuria and hematuria GI: Negative for abdominal pain, diarrhea, bleeding, constipation Skin: negative for rash and pruritus Heme: negative for easy bruising and gum/nose bleeding MS: Pain right calf Neurolo:negative for headaches, dizziness, vertigo, memory problems  Psych: negative for feelings of anxiety, depression  Endocrine: Has diabetes but says she has not been on any treatment and rather not needed any treatment in the last 2 years Allergy/Immunology-ibuprofen ?  Objective:  VITALS:  BP (!) 120/50 (BP Location: Right Arm)   Pulse 99   Temp 98.3 F (36.8 C) (Oral)   Resp 16   Ht 5\' 8"  (1.727 m)   Wt 90.7 kg   SpO2 99%   BMI 30.41 kg/m  PHYSICAL EXAM:  General: Alert, cooperative, no distress, appears stated age.  Head: Normocephalic, without obvious abnormality, atraumatic. Eyes: Conjunctivae clear, anicteric sclerae. Pupils are equal ENT Nares normal. No drainage or sinus tenderness. Lips, mucosa, and tongue normal. No Thrush Neck: Supple, symmetrical, no adenopathy, thyroid: non tender no carotid bruit and no JVD. Back: No CVA tenderness. Lungs: Clear to auscultation bilaterally. No Wheezing or Rhonchi. No rales. Heart: Regular rate and rhythm, no murmur, rub or gallop. Abdomen: Soft, non-tender,not distended. Bowel sounds normal. No masses Extremities: Right ankle swollen.  There is discharge on the dressing but I am unable to see the point of discharge.  Both feet a few amputated toes skin: No rashes or lesions. Or bruising Lymph: Cervical, supraclavicular normal. Neurologic: Grossly non-focal Pertinent Labs Lab Results CBC    Component  Value Date/Time   WBC 5.8 07/14/2019 0452   RBC 3.03 (L) 07/14/2019 0452   HGB 9.1 (L) 07/14/2019 0452   HCT 27.3 (L) 07/14/2019 0452   PLT 231 07/14/2019 0452   MCV 90.1 07/14/2019 0452   MCH 30.0 07/14/2019 0452   MCHC 33.3 07/14/2019 0452   RDW 16.3 (H) 07/14/2019 0452   LYMPHSABS 0.8 07/13/2019 1437   MONOABS 0.8 07/13/2019 1437   EOSABS 0.1 07/13/2019 1437   BASOSABS 0.0 07/13/2019 1437    CMP Latest Ref Rng & Units 07/14/2019 07/13/2019 11/19/2018  Glucose 70 - 99 mg/dL 932(T122(H) 557(D192(H) 220(U182(H)  BUN 6 - 20 mg/dL 20 54(Y23(H) 70(W38(H)  Creatinine 0.44 - 1.00 mg/dL 2.37(S1.63(H) 2.83(T1.80(H) 5.17(O1.66(H)  Sodium 135 - 145 mmol/L 139 137 131(L)  Potassium 3.5 - 5.1 mmol/L 3.8 4.1 4.3  Chloride 98 - 111 mmol/L 110 103 107  CO2 22 - 32 mmol/L 20(L) 24 17(L)  Calcium 8.9 - 10.3 mg/dL 1.6(W8.6(L) 9.3 7.3(X8.4(L)  Total Protein 6.5 - 8.1 g/dL - - 6.5  Total Bilirubin 0.3 - 1.2 mg/dL - - 0.6  Alkaline Phos 38 - 126 U/L - - 105  AST 15 - 41 U/L - - 14(L)  ALT 0 - 44 U/L - - 8      Microbiology: Recent Results (from the past 240 hour(s))  Culture, blood (routine x 2)     Status: None (Preliminary result)   Collection Time: 07/13/19  4:09 PM   Specimen: BLOOD  Result Value Ref Range Status   Specimen Description BLOOD BLOOD RIGHT HAND  Final   Special Requests   Final    BOTTLES DRAWN AEROBIC ONLY Blood Culture adequate volume   Culture   Final    NO GROWTH < 12 HOURS Performed at  [facility], [address], [place], [place] [address]    Report Status PENDING  Incomplete  Culture, blood (routine x 2)     Status: None (Preliminary result)   Collection Time: 07/13/19  4:09 PM   Specimen: BLOOD  Result Value Ref Range Status   Specimen Description BLOOD RIGHT ANTECUBITAL  Final   Special Requests   Final    BOTTLES DRAWN AEROBIC AND ANAEROBIC Blood Culture adequate volume   Culture   Final    NO GROWTH < 12 HOURS Performed at [facility], [place], [place] [address]     Report Status PENDING  Incomplete  SARS CORONAVIRUS 2 (TAT 6-24 HRS) Nasopharyngeal Nasopharyngeal Swab     Status: None   Collection Time: 07/13/19  4:09 PM   Specimen: Nasopharyngeal Swab  Result Value Ref Range Status   SARS Coronavirus 2 NEGATIVE NEGATIVE Final    Comment: (NOTE) SARS-CoV-2 target nucleic acids are NOT DETECTED. The SARS-CoV-2 RNA is generally detectable in upper and lower respiratory specimens during the acute phase of infection. Negative results do not preclude SARS-CoV-2 infection, do not rule out co-infections with other pathogens, and should not be used as the sole basis for treatment or other patient management decisions. Negative results must be combined with clinical observations, patient history, and epidemiological information. The expected result is Negative. Fact Sheet for Patients: SugarRoll.be Fact Sheet for Healthcare Providers: https://www.woods-mathews.com/ This test is not yet approved or cleared by the [place] FDA and  has been authorized for detection and/or diagnosis of SARS-CoV-2 by FDA under an Emergency Use Authorization (EUA). This EUA will remain  in effect (meaning this test can be used) for the duration of the COVID-19 declaration under Section 56 4(b)(1) of the Act, 21 U.S.C. section 360bbb-3(b)(1), unless the authorization is terminated or revoked sooner. Performed at [facility], [place] [address], [place], [place] [address]     IMAGING RESULTS: I have personally reviewed the films Right foot Charcot's arthropathy with superimposed chronic osteomyelitis ? Impression/Recommendation ? 53 year old female with history of diabetes mellitus, Charcot foot right with chronic wound and osteomyelitis.  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  Patient has had this  since 2017 with intermittent flares up.  She was recommended BKA in 2017 but she refused.  She has been taking intermittent course of antibiotics. As she is having fever we need to get a culture from the drainage.  May also benefit from a podiatry consult even though Dr. Vickki Muff had seen her in February 2020 and had mentioned in his note that she had severe destructive ankle and foot with complete collapse of the midtarsal joint and loss of talus which is not amenable for any salvage. May simply to p.o. antibiotics once fever is resolved.  Diabetes mellitus  CKD  Discussed the management with the patient ?  Note:  This document was prepared using Dragon voice recognition software and may include unintentional dictation errors.

## 2019-07-14 NOTE — Progress Notes (Signed)
Patient noted to have fever of 102.7 given PRN tylenol 650mg  Dr. Boyce Medici is aware . Will continue to monitor

## 2019-07-14 NOTE — Progress Notes (Signed)
Pharmacy Antibiotic Note  Jackie Miles is a 53 y.o. female admitted on 07/13/2019 with concern for osteomyelitis. Patient with chronic wound, refusing amputation for quite some time. Reports that she has an appointment with a surgeon to have an amputation as soon as possible. Pharmacy has been consulted for vancomycin and cefepime dosing for osteomyelitis.  Plan: -Vancomycin 2000 mg IV x 1 followed by 750 mg IV q24h to start tomorrow 10/14 at 1800 Goal AUC 400-550 Expected AUC: 487 SCr used: 1.8  Patient's SCr appears to be elevated above baseline 1.4 to 1.6, although do not have a recent SCr for several months. Will proceed with scheduled dose as above and adjust as indicated based off BMP tomorrow morning.  -Cefepime 2 g IV q12h  10/14:  Scr improved 1.80>1.63. Recalc of Vancomcyin has AUC 446  With Scr 1.63.  If Scr improves again tomorrow then may likely need dose change.   Height: 5\' 8"  (172.7 cm) Weight: 200 lb (90.7 kg) IBW/kg (Calculated) : 63.9  Temp (24hrs), Avg:101 F (38.3 C), Min:98.8 F (37.1 C), Max:103.1 F (39.5 C)  Recent Labs  Lab 07/13/19 1437 07/13/19 1603 07/13/19 1836 07/14/19 0452  WBC 10.5  --   --  5.8  CREATININE 1.80*  --   --  1.63*  LATICACIDVEN  --  1.3 1.4  --     Estimated Creatinine Clearance: 45.9 mL/min (A) (by C-G formula based on SCr of 1.63 mg/dL (H)).    Allergies  Allergen Reactions  . Ibuprofen Other (See Comments)    Reaction:  Acid reflux, GI bleeding    Antimicrobials this admission: Vancomycin 10/13 >> Cefepime 10/13 >>  Dose adjustments this admission: NA  Microbiology results: 10/13 BCx: pending   Thank you for allowing pharmacy to be a part of this patient's care.  Madiline Saffran A, PharmD 07/14/2019 11:32 AM

## 2019-07-14 NOTE — Plan of Care (Signed)
Pt states she has chronic pain with Fibromyalgia. Pt unsure regarding surgery on right foot at this time. Pt due surgical consultant. Pt resting in bed at this time.

## 2019-07-14 NOTE — Progress Notes (Signed)
[facility] NAME: Jackie Miles    MR#:  [id number]  DATE OF BIRTH:  Jun 01, 1966  SUBJECTIVE:  CHIEF COMPLAINT:   Chief Complaint  Patient presents with  . Foot Pain  . Wound Check   Came with significant pain in the right ankle for last few days. Noted to have osteomyelitis per MRI. Yesterday she was refusing for surgery. I discussed with her about the need for surgery and she agreed to have it now to have a consult.  REVIEW OF SYSTEMS:  CONSTITUTIONAL: No fever, fatigue or weakness.  EYES: No blurred or double vision.  EARS, NOSE, AND THROAT: No tinnitus or ear pain.  RESPIRATORY: No cough, shortness of breath, wheezing or hemoptysis.  CARDIOVASCULAR: No chest pain, orthopnea, edema.  GASTROINTESTINAL: No nausea, vomiting, diarrhea or abdominal pain.  GENITOURINARY: No dysuria, hematuria.  ENDOCRINE: No polyuria, nocturia,  HEMATOLOGY: No anemia, easy bruising or bleeding SKIN: No rash or lesion. MUSCULOSKELETAL: Right ankle and leg pain.   NEUROLOGIC: No tingling, numbness, weakness.  PSYCHIATRY: No anxiety or depression.   ROS  DRUG ALLERGIES:   Allergies  Allergen Reactions  . Ibuprofen Other (See Comments)    Reaction:  Acid reflux, GI bleeding    VITALS:  Blood pressure (!) 120/50, pulse 99, temperature 98.3 F (36.8 C), temperature source Oral, resp. rate 16, height 5\' 8"  (1.727 m), weight 90.7 kg, SpO2 99 %.  PHYSICAL EXAMINATION:   GENERAL:  53 y.o.-year-old patient lying in the bed with no acute distress.  EYES: Pupils equal, round, reactive to light and accommodation. No scleral icterus. Extraocular muscles intact.  HEENT: Head atraumatic, normocephalic. Oropharynx and nasopharynx clear.  NECK:  Supple, no jugular venous distention. No thyroid enlargement, no tenderness.  LUNGS: Normal breath sounds bilaterally, no wheezing, rales,rhonchi or crepitation. No use of accessory muscles of respiration.   CARDIOVASCULAR: S1, S2 normal. No murmurs, rubs, or gallops.  ABDOMEN: Soft, nontender, nondistended. Bowel sounds present. No organomegaly or mass.  EXTREMITIES: No pedal edema, cyanosis, or clubbing.  NEUROLOGIC: Cranial nerves II through XII are intact. Muscle strength 5/5 in all extremities. Sensation intact. Gait not checked.  PSYCHIATRIC: The patient is alert and oriented x 3.  SKIN: Right leg chronic discoloration.  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  No open wounds.  Increased warmth to touch on the back of the right leg and pain to palpation. In dressing.  Physical Exam LABORATORY PANEL:   CBC Recent Labs  Lab 07/14/19 0452  WBC 5.8  HGB 9.1*  HCT 27.3*  PLT 231   ------------------------------------------------------------------------------------------------------------------  Chemistries  Recent Labs  Lab 07/14/19 0452  NA 139  K 3.8  CL 110  CO2 20*  GLUCOSE 122*  BUN 20  CREATININE 1.63*  CALCIUM 8.6*   ------------------------------------------------------------------------------------------------------------------  Cardiac Enzymes No results for input(s): TROPONINI in the last 168 hours. ------------------------------------------------------------------------------------------------------------------  RADIOLOGY:  Dg Ankle Complete Right  Result Date: 07/13/2019 CLINICAL DATA:  Worsening wound outside of right foot. Drainage for 3 days. Fracture 7 years ago. 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 EXAM: RIGHT ANKLE - COMPLETE 3+ VIEW COMPARISON:  11/20/2018 FINDINGS: Diffuse soft tissue swelling, compared to the February exam. Chronic diffuse cortical thickening and altered osseous morphology involving the distal tibia and fibula. Presumed osteolysis about the talus. No well-defined soft tissue ulcer. No interval osseous destruction. No soft tissue gas. Presumed surgical deformities of the fourth and fifth digits. 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 Superimposed chronic osteomyelitis cannot be excluded. Improved soft tissue swelling without well-defined soft tissue ulcer or acute osseous destruction. Electronically Signed   By: Jeronimo GreavesKyle  Talbot M.D.   On: 07/13/2019 14:57   Mr Ankle Right Wo Contrast  Result Date: 07/13/2019 CLINICAL DATA:  Pain and fever. History of remote right foot infection and Charcot joint. EXAM: MRI OF THE RIGHT ANKLE WITHOUT CONTRAST TECHNIQUE: Multiplanar, multisequence MR imaging of the ankle was performed. No intravenous contrast was administered. COMPARISON:  Radiographs dated 07/13/2019, 11/20/2018, 04/29/2018 and 09/07/2017 FINDINGS: Bones: There is edema and marked periosteal reaction around the eroded distal tibia and fibula as well as sclerosis of the distal tibia and fibula. There is extensive sclerosis and edema in the eroded calcaneus. The talus is disintegrated. Multiple tarsal bones are destroyed as are the bases of several of the metatarsals. This is chronic and consistent with Charcot joints. There is a small effusion between the stump of the tibia and the remnants of the talus and this fluid extends in a sinus tract to the skin surface at the lateral aspect of the ankle, best seen on image 29 of series 5 and image 17 of series 6. TENDONS Peroneal: No discrete abnormality. Posteromedial: No discrete abnormality. Anterior: No discrete abnormality. Achilles: Intact. Plantar Fascia: The medial band of the plantar fascia appears normal. There appears to be a defect in the lateral band of the plantar fascia in an area of extensive soft tissue scarring. There may be an ulceration at this site. LIGAMENTS Lateral: Not visualized. Medial: Not visualized. IMPRESSION: 1. 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 2. Effusion at the ankle joint with a sinus tract extending to the lateral skin surface. Sampling of this  fluid could determine whether this is infected. 3. The edema and sclerosis and extensive periosteal reaction of the distal tibia and fibula is consistent with chronic osteomyelitis. The changes in the remnant of the talus and calcaneus are also consistent with chronic osteomyelitis. Electronically Signed   By: Francene BoyersJames  Maxwell M.D.   On: 07/13/2019 21:31   [place] Venous Img Lower Bilateral  Result Date: 07/14/2019 CLINICAL DATA:  Right lower extremity pain. EXAM: BILATERAL LOWER EXTREMITY VENOUS DOPPLER ULTRASOUND TECHNIQUE: Gray-scale sonography with graded compression, as well as color Doppler and duplex ultrasound were performed to evaluate the lower extremity deep venous systems from the level of the common femoral vein and including the common femoral, femoral, profunda femoral, popliteal and calf veins including the posterior tibial, peroneal and gastrocnemius veins when visible. The superficial great saphenous vein was also interrogated. Spectral Doppler was utilized to evaluate flow at rest and with distal augmentation maneuvers in the common femoral, femoral and popliteal veins. COMPARISON:  None. FINDINGS: RIGHT LOWER EXTREMITY Common Femoral Vein: There is good flow in the right common femoral vein. The vein is not completely compressible by ultrasound and there appears to be a component of a mild amount mural thrombus/wall thickening suggestive of probable prior DVT/chronic thrombus. Saphenofemoral Junction: No evidence of thrombus. Normal compressibility and flow on color Doppler imaging. Profunda Femoral Vein: No evidence of thrombus. Normal compressibility and flow on color Doppler imaging. Femoral Vein: Good flow is present in the femoral vein throughout the thigh. The proximal femoral vein demonstrates similar findings to the common femoral vein with suggestion of mild wall thickening/mural thrombus causing lack of complete compressibility. The vein is nearly completely compressible and this may  represent the  residua of prior DVT. This does not have the appearance of acute thrombus. Popliteal Vein: No evidence of thrombus. Normal compressibility, respiratory phasicity and response to augmentation. Calf Veins: No evidence of thrombus. Normal compressibility and flow on color Doppler imaging. Superficial Great Saphenous Vein: No evidence of thrombus. Normal compressibility. Venous Reflux:  None. Other Findings: Prominent adjacent right inguinal lymph nodes with the largest measuring 2.7 cm in short axis. Right inguinal nodes demonstrate fatty hila. These may be reactive nodes. Correlation suggested with any other palpable lymphadenopathy clinically. No evidence of superficial thrombophlebitis. LEFT LOWER EXTREMITY Common Femoral Vein: No evidence of thrombus. Normal compressibility, respiratory phasicity and response to augmentation. Saphenofemoral Junction: No evidence of thrombus. Normal compressibility and flow on color Doppler imaging. Profunda Femoral Vein: No evidence of thrombus. Normal compressibility and flow on color Doppler imaging. Femoral Vein: No evidence of thrombus. Normal compressibility, respiratory phasicity and response to augmentation. Popliteal Vein: No evidence of thrombus. Normal compressibility, respiratory phasicity and response to augmentation. Calf Veins: No evidence of thrombus. Normal compressibility and flow on color Doppler imaging. Superficial Great Saphenous Vein: No evidence of thrombus. Normal compressibility. Venous Reflux:  None. Other Findings: No evidence of superficial thrombophlebitis or abnormal fluid collection. No enlarged left inguinal lymph nodes identified. IMPRESSION: 1. Lack of complete compressibility of the right common femoral and proximal femoral veins due to mural thickening/chronic thrombus. These veins demonstrate good flow and findings do not appear to represent acute thrombus. This most likely represents residua of prior DVT. 2. Enlarged right  inguinal lymph nodes. Although enlarged, these nodes demonstrate normal morphology with fatty hila. These may be reactive nodes. Pathologic nodes are not completely excluded, however, and correlation suggested with physical examination for any other palpable lymphadenopathy or laboratory abnormalities suggestive of hematologic disease. Electronically Signed   By: Irish Lack M.D.   On: 07/14/2019 11:41    ASSESSMENT AND PLAN:   Active Problems:   Osteomyelitis (HCC)  1.  Cellulitis right lower extremity with chronic osteomyelitis right ankle, with fever.  MRI right ankle.    vancomycin and cefepime at this time. ID consult. Patient was refusing for surgery yesterday to admitting doctor but today when I explained her she agreed so I will call vascular surgery consult for possible need of amputation.  2.  Pain right calf.   get an ultrasound of the right lower extremity to rule out DVT. 3.  Fibromyalgia on Neurontin at night and amitriptyline 4.  Chronic kidney disease stage III.  Gentle fluids.  Watch creatinine with antibiotics. 5.  Type 2 diabetes with Impaired fasting glucose.  Check a hemoglobin A1c and place on sliding scale 6.  Tobacco abuse smoking cessation counseling done 4 minutes by me.  Nicotine patch applied     All the records are reviewed and case discussed with Care Management/Social Workerr. Management plans discussed with the patient, family and they are in agreement.  CODE STATUS: Full  TOTAL TIME TAKING CARE OF THIS PATIENT: 35 minutes.     POSSIBLE D/C IN 1-2 DAYS, DEPENDING ON CLINICAL CONDITION.   Altamese Dilling M.D on 07/14/2019   Between 7am to 6pm - Pager - [phone]  After 6pm go to [email or web address] - password [email or web address]  [facility]  Office  [phone]  CC: Primary care physician; Patient, No Pcp Per  Note: This dictation was prepared with Dragon dictation along with smaller phrase technology. Any transcriptional  errors that result from this process are unintentional.

## 2019-07-14 NOTE — Progress Notes (Signed)
Patient temp 103.1 HR 114 MEWS 4. MD notified. Orders received.

## 2019-07-14 NOTE — Progress Notes (Signed)
Patient afebrile at this time , VSS. No adverse reactions to ABT therapy . Refused lunch. Lying in bed sleeping through most of shift.  PO fluids encouraged accepted without difficulty . No c/o pain or discomfort. Will continue to monitor

## 2019-07-14 NOTE — Progress Notes (Signed)
Temp 100.4. oral . MEWS green at this time. Doctor notified of yellow MEWS , completing mews that was red. Then protocol for YELLOW MEWS will be initiated. Up in bed feeding self dinner , vancomycin infusing to Left AC without difficulty .

## 2019-07-14 NOTE — Progress Notes (Signed)
Patient refused cooling blanket ordered by MD. Patient taught the importance of lowering her temperature. Opted to have lots of her blankets removed and temperature in room lowered. Also offered a cool beverage.

## 2019-07-14 NOTE — TOC Initial Note (Signed)
Transition of Care [facility]) - Initial/Assessment Note    Patient Details  Name: Jackie Miles MRN: [id number] Date of Birth: August 20, 1966  [facility] CM/SW Contact:    Keontay Vora, Lenice Llamas Phone Number: [phone]  07/14/2019, 2:39 PM  Clinical Narrative: Clinical Social Worker (CSW) met with patient to discuss D/C plan. Patient was alert and oriented X4 and was laying in the bed. CSW introduced self and explained role of CSW department. Per patient she lives in [place] with her brother Truddie Coco. Per patient she still drives herself to appointments. Patient reported that she has a wheel chair at home. Per patient she currently does not have a PCP and is trying to get established at [facility] clinic in [place]. CSW explained that Medicaid usually assigns a PCP on the back of patient's card. Per patient she has contacted her Medicaid worker and told her about the [facility] in [place]. Per patient her mediciad worker will add a new doctor once she becomes established. Patient reported that her plan is to return home after hospital stay. Patient reported that her brother can provide support. CSW will continue to follow and assist as needed.               Expected Discharge Plan: Home/Self Care Barriers to Discharge: Continued Medical Work up   Patient Goals and CMS Choice Patient states their goals for this hospitalization and ongoing recovery are:: To go home.      Expected Discharge Plan and Services Expected Discharge Plan: Home/Self Care In-house Referral: Clinical Social Work     Living arrangements for the past 2 months: Single Family Home                                      Prior Living Arrangements/Services Living arrangements for the past 2 months: Single Family Home Lives with:: Siblings Patient language and need for interpreter reviewed:: No Do you feel safe going back to the place where you live?: Yes      Need for Family Participation in Patient Care: No  (Comment) Care giver support system in place?: Yes (comment) Current home services: DME(Patient has a wheel chair at home.) Criminal Activity/Legal Involvement Pertinent to Current Situation/Hospitalization: No - Comment as needed  Activities of Daily Living Home Assistive Devices/Equipment: Brace (specify type) ADL Screening (condition at time of admission) Patient's cognitive ability adequate to safely complete daily activities?: No Is the patient deaf or have difficulty hearing?: No Does the patient have difficulty seeing, even when wearing glasses/contacts?: No Does the patient have difficulty concentrating, remembering, or making decisions?: No Patient able to express need for assistance with ADLs?: Yes Does the patient have difficulty dressing or bathing?: No Independently performs ADLs?: Yes (appropriate for developmental age) Does the patient have difficulty walking or climbing stairs?: Yes Weakness of Legs: Right Weakness of Arms/Hands: None  Permission Sought/Granted                  Emotional Assessment Appearance:: Appears stated age Attitude/Demeanor/Rapport: Engaged Affect (typically observed): Pleasant, Calm Orientation: : Oriented to Self, Oriented to Place, Oriented to  Time, Oriented to Situation Alcohol / Substance Use: Not Applicable Psych Involvement: No (comment)  Admission diagnosis:  Leg pain [M79.606] Diabetic foot infection ([place]) [H37.169, L08.9] Acute on chronic osteomyelitis ([place]) [M86.10, M86.60] Patient Active Problem List   Diagnosis Date Noted  . Non-healing ulcer of right foot (  [place]) 11/20/2018  . Tobacco use disorder 11/06/2018  . Delusional disorder ([place]) 11/04/2018  . Cellulitis 09/07/2017  . HTN (hypertension) 05/28/2017  . Asthma 05/28/2017  . HLD (hyperlipidemia) 05/28/2017  . AKI (acute kidney injury) ([place]) 05/28/2017  . 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 Cellulitis of foot, right   . Pressure injury  of skin 02/14/2017  . Osteomyelitis ([place]) 02/21/2016  . Foot infection 01/22/2016  . Sepsis ([place]) 01/12/2016   PCP:  Patient, No Pcp Per Pharmacy:   [facility] [place] [address] Phone: [phone] Fax: [phone]  [facility] [address]), [place] [address] Phone: [phone] Fax: [phone]  [facility] [address] [place] [address] Phone: [phone] Fax: [facility], [place] [address] Phone: [phone] Fax: [phone]  [place], [place] [address] Phone: [phone] Fax: [phone]     Social Determinants of Health (SDOH) Interventions    Readmission Risk Interventions No flowsheet data found.

## 2019-07-15 DIAGNOSIS — M86171 Other acute osteomyelitis, right ankle and foot: Secondary | ICD-10-CM

## 2019-07-15 LAB — CREATININE, SERUM
Creatinine, Ser: 1.39 mg/dL — ABNORMAL HIGH (ref 0.44–1.00)
GFR calc Af Amer: 49 mL/min — ABNORMAL LOW (ref >60)
GFR calc non Af Amer: 43 mL/min — ABNORMAL LOW (ref >60)

## 2019-07-15 MED ORDER — AMOXICILLIN-POT CLAVULANATE 875-125 MG PO TABS: 1.0000 | ORAL_TABLET | Freq: Two times a day (BID) | ORAL | 0 refills | Status: DC

## 2019-07-15 MED ORDER — VANCOMYCIN HCL IN DEXTROSE 1-5 GM/200ML-% IV SOLN
1000.0000 mg | INTRAVENOUS | Status: DC
  Filled 2019-07-15: qty 200

## 2019-07-15 NOTE — Discharge Summary (Signed)
Patient readmitted for significant osteomyelitis to the hospital.  For last 2 to 3 years she had denied surgery and continued to get antibiotics.  She has osteomyelitis now spread to her lower part of tibia and fibula on the right.  Possibly requiring below-knee amputation.  I have called vascular and podiatry consult.  Also infectious disease consult. She did not wanted to have surgery, and decided to leave [facility].  She was telling me that she would like to go to [facility] and get her treatment over there.

## 2019-07-15 NOTE — Consult Note (Signed)
[facility] Vascular Consult Note  MRN : [id number]  Jackie Miles is a 53 y.o. (1966-07-03) female who presents with chief complaint of  Chief Complaint  Patient presents with  . Foot Pain  . Wound Check   History of Present Illness:  The patient is a 53 year old female with multiple medical issues (see below) who presented to the [facility] emergency department on July 13, 2019 with a chief complaint of progressively worsening right foot pain.  Patient has a known history of chronic wounds to the right lower extremity.  Past surgical history of right fourth and fifth toe amputation.  Patient with known diagnosis of Charcot joint  patient has been seen at [facility] and amputation was recommended however the patient refused. Patient presented to the ED with new wound to the right ankle over the lateral malleolus.  Denies any recent trauma to the ankle.  There is drainage and surrounding swelling.  She denies any fever, nausea vomiting.  She denies any shortness of breath or chest pain.  She is relatively wheelchair-bound.  She was adamant in the emergency department that she did not want any type of intervention or surgery.  X-ray consistent with chronic osteomyelitis.  As per podiatry, foot is not salvageable.  Vascular surgery was consulted by Dr. Anselm Jungling for further recommendations. Current Facility-Administered Medications  Medication Dose Route Frequency Provider Last Rate Last Dose  . 0.9 %  sodium chloride infusion   Intravenous Continuous Loletha Grayer, MD 50 mL/hr at 07/13/19 1811    . acetaminophen (TYLENOL) tablet 650 mg  650 mg Oral Q6H PRN Loletha Grayer, MD   650 mg at 07/14/19 1700   Or  . acetaminophen (TYLENOL) suppository 650 mg  650 mg Rectal Q6H PRN Wieting, Richard, MD      . amitriptyline (ELAVIL) tablet 50 mg  50 mg Oral QHS Loletha Grayer, MD   50 mg at 07/14/19 2142  . ceFEPIme (MAXIPIME) 2 g in sodium chloride 0.9  % 100 mL IVPB  2 g Intravenous Q12H Loletha Grayer, MD 200 mL/hr at 07/15/19 0345 2 g at 07/15/19 0345  . enoxaparin (LOVENOX) injection 40 mg  40 mg Subcutaneous Q24H Loletha Grayer, MD   40 mg at 07/13/19 2139  . gabapentin (NEURONTIN) capsule 1,600 mg  1,600 mg Oral QHS Loletha Grayer, MD   1,600 mg at 07/14/19 2142  . insulin aspart (novoLOG) injection 0-5 Units  0-5 Units Subcutaneous QHS Wieting, Richard, MD      . insulin aspart (novoLOG) injection 0-9 Units  0-9 Units Subcutaneous TID WC Loletha Grayer, MD   1 Units at 07/14/19 [phone]  . LORazepam (ATIVAN) injection 0.5 mg  0.5 mg Intravenous Once PRN Loletha Grayer, MD      . nicotine (NICODERM CQ - dosed in mg/24 hr) patch 7 mg  7 mg Transdermal Daily Loletha Grayer, MD   7 mg at 07/14/19 6269  . ondansetron (ZOFRAN) tablet 4 mg  4 mg Oral Q6H PRN Loletha Grayer, MD       Or  . ondansetron [facility] injection 4 mg  4 mg Intravenous Q6H PRN Wieting, Richard, MD      . oxyCODONE (Oxy IR/ROXICODONE) immediate release tablet 5 mg  5 mg Oral Q6H PRN Loletha Grayer, MD   5 mg at 07/14/19 2142  . vancomycin (VANCOCIN) IVPB 1000 mg/200 mL premix  1,000 mg Intravenous Q24H Vaughan Basta, MD       Past Medical History:  Diagnosis  Date  . Asthma   . Diabetes mellitus without complication (HCC)   . Fibromyalgia   . H/O percutaneous left heart catheterization   . HLD (hyperlipidemia)   . Hypertension   . Shingles    Past Surgical History:  Procedure Laterality Date  . toe amputation     Social History Social History   Tobacco Use  . Smoking status: Current Every Day Smoker    Packs/day: 0.25  . Smokeless tobacco: Never Used  Substance Use Topics  . Alcohol use: No  . Drug use: No   Family History Family History  Problem Relation Age of Onset  . Diabetes Mother   . Diabetes Sister   . CAD Father   . Cancer Father   Denies family history of peripheral artery disease, venous disease or renal  disease.  Allergies  Allergen Reactions  . Ibuprofen Other (See Comments)    Reaction:  Acid reflux, GI bleeding   REVIEW OF SYSTEMS (Negative unless checked)  Constitutional: [] Weight loss  [] Fever  [] Chills Cardiac: [] Chest pain   [] Chest pressure   [] Palpitations   [] Shortness of breath when laying flat   [] Shortness of breath at rest   [] Shortness of breath with exertion. Vascular:  [] Pain in legs with walking   [x] Pain in legs at rest   [x] Pain in legs when laying flat   [] Claudication   [] Pain in feet when walking  [x] Pain in feet at rest  [x] Pain in feet when laying flat   [] History of DVT   [] Phlebitis   [] Swelling in legs   [] Varicose veins   [x] Non-healing ulcers Pulmonary:   [] Uses home oxygen   [] Productive cough   [] Hemoptysis   [] Wheeze  [] COPD   [] Asthma Neurologic:  [] Dizziness  [] Blackouts   [] Seizures   [] History of stroke   [] History of TIA  [] Aphasia   [] Temporary blindness   [] Dysphagia   [] Weakness or numbness in arms   [] Weakness or numbness in legs Musculoskeletal:  [] Arthritis   [] Joint swelling   [] Joint pain   [] Low back pain Hematologic:  [] Easy bruising  [] Easy bleeding   [] Hypercoagulable state   [] Anemic  [] Hepatitis Gastrointestinal:  [] Blood in stool   [] Vomiting blood  [] Gastroesophageal reflux/heartburn   [] Difficulty swallowing. Genitourinary:  [] Chronic kidney disease   [] Difficult urination  [] Frequent urination  [] Burning with urination   [] Blood in urine Skin:  [] Rashes   [x] Ulcers   [x] Wounds Psychological:  [] History of anxiety   []  History of major depression.  Physical Examination  Vitals:   07/14/19 2030 07/15/19 0007 07/15/19 0428 07/15/19 0844  BP: 117/62 (!) 109/49 115/63 (!) 105/52  Pulse: 89 91 90 80  Resp: 19 19 19 16   Temp: 99 F (37.2 C) 98.8 F (37.1 C) 98.8 F (37.1 C) 98.9 F (37.2 C)  TempSrc: Oral Oral  Oral  SpO2: 99% 98% 94% 97%  Weight:      Height:       Body mass index is 30.41 kg/m. Gen:  WD/WN, NAD Head: [place]/AT,  No temporalis wasting. Prominent temp pulse not noted. Ear/Nose/Throat: Hearing grossly intact, nares w/o erythema or drainage, oropharynx w/o Erythema/Exudate Eyes: Sclera non-icteric, conjunctiva clear Neck: Trachea midline.  No JVD.  Pulmonary:  Good air movement, respirations not labored, equal bilaterally.  Cardiac: RRR, normal S1, S2. Vascular:  Vessel Right Left  Radial Palpable Palpable  Ulnar Palpable Palpable  Brachial Palpable Palpable  Carotid Palpable, without bruit Palpable, without bruit  Aorta Not palpable N/A  Femoral  Palpable Palpable  Popliteal Palpable Palpable  PT Non-Palpable Non-Palpable  DP Non-Palpable Non-Palpable   Gastrointestinal: soft, non-tender/non-distended. No guarding/reflex.  Musculoskeletal: M/S 5/5 throughout.  Neurologic: Sensation grossly intact in extremities.  Symmetrical.  Speech is fluent. Motor exam as listed above. Psychiatric: Judgment intact, Mood & affect appropriate for pt's clinical situation. Dermatologic:   Right lower extremity: Approximately 2 to 3 cm wound noted to the right lateral malleolus. Charcot deformity noted.  Prior toe amputation sites are clean dry and intact.  Venous stasis discoloration noted. Lymph : No Cervical, Axillary, or Inguinal lymphadenopathy.  CBC Lab Results  Component Value Date   WBC 5.8 07/14/2019   HGB 9.1 (L) 07/14/2019   HCT 27.3 (L) 07/14/2019   MCV 90.1 07/14/2019   PLT 231 07/14/2019   BMET    Component Value Date/Time   NA 139 07/14/2019 0452   K 3.8 07/14/2019 0452   CL 110 07/14/2019 0452   CO2 20 (L) 07/14/2019 0452   GLUCOSE 122 (H) 07/14/2019 0452   BUN 20 07/14/2019 0452   CREATININE 1.39 (H) 07/15/2019 0412   CALCIUM 8.6 (L) 07/14/2019 0452   GFRNONAA 43 (L) 07/15/2019 0412   GFRAA 49 (L) 07/15/2019 0412   Estimated Creatinine Clearance: 53.9 mL/min (A) (by C-G formula based on SCr of 1.39 mg/dL (H)).  COAG Lab Results  Component Value Date   INR 1.23 11/19/2018    INR 1.22 05/27/2017   INR 1.17 02/13/2017   Radiology Dg Ankle Complete Right  Result Date: 07/13/2019 CLINICAL DATA:  Worsening wound outside of right foot. Drainage for 3 days. Fracture 7 years ago. 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 EXAM: RIGHT ANKLE - COMPLETE 3+ VIEW COMPARISON:  11/20/2018 FINDINGS: Diffuse soft tissue swelling, compared to the February exam. Chronic diffuse cortical thickening and altered osseous morphology involving the distal tibia and fibula. Presumed osteolysis about the talus. No well-defined soft tissue ulcer. No interval osseous destruction. No soft tissue gas. Presumed surgical deformities of the fourth and fifth digits. 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 Superimposed chronic osteomyelitis cannot be excluded. Improved soft tissue swelling without well-defined soft tissue ulcer or acute osseous destruction. Electronically Signed   By: Jeronimo Greaves M.D.   On: 07/13/2019 14:57   Mr Ankle Right Wo Contrast  Result Date: 07/13/2019 CLINICAL DATA:  Pain and fever. History of remote right foot infection and Charcot joint. EXAM: MRI OF THE RIGHT ANKLE WITHOUT CONTRAST TECHNIQUE: Multiplanar, multisequence MR imaging of the ankle was performed. No intravenous contrast was administered. COMPARISON:  Radiographs dated 07/13/2019, 11/20/2018, 04/29/2018 and 09/07/2017 FINDINGS: Bones: There is edema and marked periosteal reaction around the eroded distal tibia and fibula as well as sclerosis of the distal tibia and fibula. There is extensive sclerosis and edema in the eroded calcaneus. The talus is disintegrated. Multiple tarsal bones are destroyed as are the bases of several of the metatarsals. This is chronic and consistent with Charcot joints. There is a small effusion between the stump of the tibia and the remnants of the talus and this fluid extends in a sinus tract to the skin surface at  the lateral aspect of the ankle, best seen on image 29 of series 5 and image 17 of series 6. TENDONS Peroneal: No discrete abnormality. Posteromedial: No discrete abnormality. Anterior: No discrete abnormality. Achilles: Intact. Plantar Fascia: The medial band of the plantar fascia appears normal. There appears to be a defect in  the lateral band of the plantar fascia in an area of extensive soft tissue scarring. There may be an ulceration at this site. LIGAMENTS Lateral: Not visualized. Medial: Not visualized. IMPRESSION: 1. 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 2. Effusion at the ankle joint with a sinus tract extending to the lateral skin surface. Sampling of this fluid could determine whether this is infected. 3. The edema and sclerosis and extensive periosteal reaction of the distal tibia and fibula is consistent with chronic osteomyelitis. The changes in the remnant of the talus and calcaneus are also consistent with chronic osteomyelitis. Electronically Signed   By: Francene Boyers M.D.   On: 07/13/2019 21:31   US Venous Img Lower Bilateral  Result Date: 07/14/2019 CLINICAL DATA:  Right lower extremity pain. EXAM: BILATERAL LOWER EXTREMITY VENOUS DOPPLER ULTRASOUND TECHNIQUE: Gray-scale sonography with graded compression, as well as color Doppler and duplex ultrasound were performed to evaluate the lower extremity deep venous systems from the level of the common femoral vein and including the common femoral, femoral, profunda femoral, popliteal and calf veins including the posterior tibial, peroneal and gastrocnemius veins when visible. The superficial great saphenous vein was also interrogated. Spectral Doppler was utilized to evaluate flow at rest and with distal augmentation maneuvers in the common femoral, femoral and popliteal veins. COMPARISON:  None. FINDINGS: RIGHT LOWER EXTREMITY Common Femoral Vein: There is good flow in the right common femoral vein. The vein is not  completely compressible by ultrasound and there appears to be a component of a mild amount mural thrombus/wall thickening suggestive of probable prior DVT/chronic thrombus. Saphenofemoral Junction: No evidence of thrombus. Normal compressibility and flow on color Doppler imaging. Profunda Femoral Vein: No evidence of thrombus. Normal compressibility and flow on color Doppler imaging. Femoral Vein: Good flow is present in the femoral vein throughout the thigh. The proximal femoral vein demonstrates similar findings to the common femoral vein with suggestion of mild wall thickening/mural thrombus causing lack of complete compressibility. The vein is nearly completely compressible and this may represent the residua of prior DVT. This does not have the appearance of acute thrombus. Popliteal Vein: No evidence of thrombus. Normal compressibility, respiratory phasicity and response to augmentation. Calf Veins: No evidence of thrombus. Normal compressibility and flow on color Doppler imaging. Superficial Great Saphenous Vein: No evidence of thrombus. Normal compressibility. Venous Reflux:  None. Other Findings: Prominent adjacent right inguinal lymph nodes with the largest measuring 2.7 cm in short axis. Right inguinal nodes demonstrate fatty hila. These may be reactive nodes. Correlation suggested with any other palpable lymphadenopathy clinically. No evidence of superficial thrombophlebitis. LEFT LOWER EXTREMITY Common Femoral Vein: No evidence of thrombus. Normal compressibility, respiratory phasicity and response to augmentation. Saphenofemoral Junction: No evidence of thrombus. Normal compressibility and flow on color Doppler imaging. Profunda Femoral Vein: No evidence of thrombus. Normal compressibility and flow on color Doppler imaging. Femoral Vein: No evidence of thrombus. Normal compressibility, respiratory phasicity and response to augmentation. Popliteal Vein: No evidence of thrombus. Normal compressibility,  respiratory phasicity and response to augmentation. Calf Veins: No evidence of thrombus. Normal compressibility and flow on color Doppler imaging. Superficial Great Saphenous Vein: No evidence of thrombus. Normal compressibility. Venous Reflux:  None. Other Findings: No evidence of superficial thrombophlebitis or abnormal fluid collection. No enlarged left inguinal lymph nodes identified. IMPRESSION: 1. Lack of complete compressibility of the right common femoral and proximal femoral veins due to mural thickening/chronic thrombus. These veins demonstrate good flow and findings do not appear to  represent acute thrombus. This most likely represents residua of prior DVT. 2. Enlarged right inguinal lymph nodes. Although enlarged, these nodes demonstrate normal morphology with fatty hila. These may be reactive nodes. Pathologic nodes are not completely excluded, however, and correlation suggested with physical examination for any other palpable lymphadenopathy or laboratory abnormalities suggestive of hematologic disease. Electronically Signed   By: Irish Lack M.D.   On: 07/14/2019 11:41   Assessment/Plan The patient is a 53 year old female with multiple medical issues (see below) who presented to the [facility] emergency department on July 13, 2019 with a chief complaint of progressively worsening right foot pain. 1.  Right lower extremity wound: Patient with multiple risk factors for peripheral artery disease.  Patient with a known history of Charcot joint with wounds / amputation of toes 4 and 5 in the past.  Presents now with new wound to the right lateral malleolus.  Significant osteomyelitis.  Patient has been seen at [facility] in the past and amputation was recommended however she refused.  We would proceed with an angiogram first assess the patient's arterial patency in preparation for a BKA versus AKA however at this time the patient is refusing.  Patient states that she wants to be  discharged home and go to [facility] for further care.  We did have a long discussion in regard to blood flow and the ability to heal.  She understands if that she does not have adequate blood flow to the ankle any antibiotic therapy will not help.  She also understands that she has significant osteomyelitis to the bone.  She expresses understanding that there is a high risk of sepsis and death if her wounds/osteomyelitis continue to worsen. 2. Tobacco Abuse: We had a discussion for approximately five minutes regarding the absolute need for smoking cessation due to the deleterious nature of tobacco on the vascular system. We discussed the tobacco use would diminish patency of any intervention, and likely significantly worsen progressio of disease. We discussed multiple agents for quitting including replacement therapy or medications to reduce cravings such as Chantix. The patient voices their understanding of the importance of smoking cessation. 3. Hyperlipidemia: Would recommend aspirin and a statin for medical management. Encouraged good control as its slows the progression of atherosclerotic disease. 4. Diabetes: Encouraged good control as its slows the progression of atherosclerotic disease.  Discussed with Dr. Weldon Inches, PA-C  07/15/2019 11:14 AM  This note was created with Dragon medical transcription system.  Any error is purely unintentional

## 2019-07-15 NOTE — Progress Notes (Signed)
Pharmacy Antibiotic Note  Jackie Miles is a 53 y.o. female admitted on 07/13/2019 with concern for osteomyelitis. Patient with chronic wound, refusing amputation for quite some time. Pharmacy has been consulted for vancomycin and cefepime dosing for osteomyelitis.  Plan: -Cefepime 2 g IV q12h -Vancomycin 2000 mg IV x 1 followed by 750 mg IV q24h to start tomorrow 10/14 at 1800 Goal AUC 400-550 Expected AUC: 487 SCr used: 1.8  10/15:  Scr improved again 1.63>1.39. Will adjust Vancomycin to 1000 mg IV q24h.   Goal AUC 400-550.   Wt 90.7kg  BMI  30.4 Expected AUC: 516 SCr used: 1.39 PER ID note- transition to PO when fever resolved    Height: 5\' 8"  (172.7 cm) Weight: 200 lb (90.7 kg) IBW/kg (Calculated) : 63.9  Temp (24hrs), Avg:99.7 F (37.6 C), Min:98.3 F (36.8 C), Max:102.7 F (39.3 C)  Recent Labs  Lab 07/13/19 1437 07/13/19 1603 07/13/19 1836 07/14/19 0452 07/15/19 0412  WBC 10.5  --   --  5.8  --   CREATININE 1.80*  --   --  1.63* 1.39*  LATICACIDVEN  --  1.3 1.4  --   --     Estimated Creatinine Clearance: 53.9 mL/min (A) (by C-G formula based on SCr of 1.39 mg/dL (H)).    Allergies  Allergen Reactions  . Ibuprofen Other (See Comments)    Reaction:  Acid reflux, GI bleeding    Antimicrobials this admission: Vancomycin 10/13 >> Cefepime 10/13 >>  Dose adjustments this admission: 10/15 Vanc 750mg  q24h to 1000mg  q24h  Microbiology results: 10/13 BCx: NGTD   Thank you for allowing pharmacy to be a part of this patient's care.  Neithan Day A, PharmD 07/15/2019 8:35 AM

## 2019-07-15 NOTE — Progress Notes (Signed)
Pt leaving AMA despite education given. ABX Rx given upon leaving per MD request

## 2019-07-15 NOTE — Progress Notes (Signed)
Pt refused finger sticks for remainder of hospital stay. MD notified. Also patient stated she wanted to leave and go to [facility]. Also stated the only reason she was here is because she fell at home and son called rescue. She is refusing all nursing interventions

## 2019-07-15 NOTE — Progress Notes (Signed)
   07/15/19 1100  Clinical Encounter Type  Visited With Patient;Health care provider  Visit Type Initial  Referral From [place] received a referral from the Asst. Unit Director to see the patient. This chaplain made aware that the patient was faced with several concerns, including her medical challenges and grieving the recent loss of her sister. She was preparing to leave AMA. This chaplain was able to talk with the patient as she prepared to leave the unit via wheelchair. The patient verbalized that she hopes to seek care at [facility] or [facility] after leaving. This chaplain encouraged the patient to take good care of herself and to look after her health, especially in the wake of her incomplete treatment here at [facility]. The patient affirmed this and spoke about the prominence of her faith and the role that God also has in her well-being.

## 2019-07-15 NOTE — Progress Notes (Signed)
[employer] -  at [facility]   PATIENT NAME: Jackie Miles    MR#:  [id number]  DATE OF BIRTH:  1966-01-24  SUBJECTIVE:  CHIEF COMPLAINT:   Chief Complaint  Patient presents with  . Foot Pain  . Wound Check   Came with significant pain in the right ankle for last few days. Noted to have osteomyelitis per MRI. On admission patient was refusing for surgery but then later agreed so I had called surgical consult.  Today again she is refusing for that and is not very compliant regarding medical advises and was asking me that she want to leave from the hospital.  REVIEW OF SYSTEMS:  CONSTITUTIONAL: No fever, fatigue or weakness.  EYES: No blurred or double vision.  EARS, NOSE, AND THROAT: No tinnitus or ear pain.  RESPIRATORY: No cough, shortness of breath, wheezing or hemoptysis.  CARDIOVASCULAR: No chest pain, orthopnea, edema.  GASTROINTESTINAL: No nausea, vomiting, diarrhea or abdominal pain.  GENITOURINARY: No dysuria, hematuria.  ENDOCRINE: No polyuria, nocturia,  HEMATOLOGY: No anemia, easy bruising or bleeding SKIN: No rash or lesion. MUSCULOSKELETAL: Right ankle and leg pain.   NEUROLOGIC: No tingling, numbness, weakness.  PSYCHIATRY: No anxiety or depression.   ROS  DRUG ALLERGIES:   Allergies  Allergen Reactions  . Ibuprofen Other (See Comments)    Reaction:  Acid reflux, GI bleeding    VITALS:  Blood pressure (!) 105/52, pulse 80, temperature 98.9 F (37.2 C), temperature source Oral, resp. rate 16, height 5\' 8"  (1.727 m), weight 90.7 kg, SpO2 97 %.  PHYSICAL EXAMINATION:   GENERAL:  53 y.o.-year-old patient lying in the bed with no acute distress.  EYES: Pupils equal, round, reactive to light and accommodation. No scleral icterus. Extraocular muscles intact.  HEENT: Head atraumatic, normocephalic. Oropharynx and nasopharynx clear.  NECK:  Supple, no jugular venous distention. No thyroid enlargement, no tenderness.  LUNGS: Normal breath  sounds bilaterally, no wheezing, rales,rhonchi or crepitation. No use of accessory muscles of respiration.  CARDIOVASCULAR: S1, S2 normal. No murmurs, rubs, or gallops.  ABDOMEN: Soft, nontender, nondistended. Bowel sounds present. No organomegaly or mass.  EXTREMITIES: No pedal edema, cyanosis, or clubbing.  NEUROLOGIC: Cranial nerves II through XII are intact. Muscle strength 5/5 in all extremities. Sensation intact. Gait not checked.  PSYCHIATRIC: The patient is alert and oriented x 3.  SKIN: Right leg chronic discoloration.  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  No open wounds.  Increased warmth to touch on the back of the right leg and pain to palpation. In dressing.  Physical Exam LABORATORY PANEL:   CBC Recent Labs  Lab 07/14/19 0452  WBC 5.8  HGB 9.1*  HCT 27.3*  PLT 231   ------------------------------------------------------------------------------------------------------------------  Chemistries  Recent Labs  Lab 07/14/19 0452 07/15/19 0412  NA 139  --   K 3.8  --   CL 110  --   CO2 20*  --   GLUCOSE 122*  --   BUN 20  --   CREATININE 1.63* 1.39*  CALCIUM 8.6*  --    ------------------------------------------------------------------------------------------------------------------  Cardiac Enzymes No results for input(s): TROPONINI in the last 168 hours. ------------------------------------------------------------------------------------------------------------------  RADIOLOGY:  Dg Ankle Complete Right  Result Date: 07/13/2019 CLINICAL DATA:  Worsening wound outside of right foot. Drainage for 3 days. Fracture 7 years ago. 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 EXAM: RIGHT ANKLE - COMPLETE 3+ VIEW COMPARISON:  11/20/2018 FINDINGS: Diffuse soft tissue swelling, compared to the February exam. Chronic diffuse cortical thickening  and altered osseous morphology involving the distal tibia and fibula. Presumed osteolysis about the talus. No  well-defined soft tissue ulcer. No interval osseous destruction. No soft tissue gas. Presumed surgical deformities of the fourth and fifth digits. 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 Superimposed chronic osteomyelitis cannot be excluded. Improved soft tissue swelling without well-defined soft tissue ulcer or acute osseous destruction. Electronically Signed   By: Jeronimo Greaves M.D.   On: 07/13/2019 14:57   Mr Ankle Right Wo Contrast  Result Date: 07/13/2019 CLINICAL DATA:  Pain and fever. History of remote right foot infection and Charcot joint. EXAM: MRI OF THE RIGHT ANKLE WITHOUT CONTRAST TECHNIQUE: Multiplanar, multisequence MR imaging of the ankle was performed. No intravenous contrast was administered. COMPARISON:  Radiographs dated 07/13/2019, 11/20/2018, 04/29/2018 and 09/07/2017 FINDINGS: Bones: There is edema and marked periosteal reaction around the eroded distal tibia and fibula as well as sclerosis of the distal tibia and fibula. There is extensive sclerosis and edema in the eroded calcaneus. The talus is disintegrated. Multiple tarsal bones are destroyed as are the bases of several of the metatarsals. This is chronic and consistent with Charcot joints. There is a small effusion between the stump of the tibia and the remnants of the talus and this fluid extends in a sinus tract to the skin surface at the lateral aspect of the ankle, best seen on image 29 of series 5 and image 17 of series 6. TENDONS Peroneal: No discrete abnormality. Posteromedial: No discrete abnormality. Anterior: No discrete abnormality. Achilles: Intact. Plantar Fascia: The medial band of the plantar fascia appears normal. There appears to be a defect in the lateral band of the plantar fascia in an area of extensive soft tissue scarring. There may be an ulceration at this site. LIGAMENTS Lateral: Not visualized. Medial: Not visualized. IMPRESSION: 1.  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 2. Effusion at the ankle joint with a sinus tract extending to the lateral skin surface. Sampling of this fluid could determine whether this is infected. 3. The edema and sclerosis and extensive periosteal reaction of the distal tibia and fibula is consistent with chronic osteomyelitis. The changes in the remnant of the talus and calcaneus are also consistent with chronic osteomyelitis. Electronically Signed   By: Francene Boyers M.D.   On: 07/13/2019 21:31   US Venous Img Lower Bilateral  Result Date: 07/14/2019 CLINICAL DATA:  Right lower extremity pain. EXAM: BILATERAL LOWER EXTREMITY VENOUS DOPPLER ULTRASOUND TECHNIQUE: Gray-scale sonography with graded compression, as well as color Doppler and duplex ultrasound were performed to evaluate the lower extremity deep venous systems from the level of the common femoral vein and including the common femoral, femoral, profunda femoral, popliteal and calf veins including the posterior tibial, peroneal and gastrocnemius veins when visible. The superficial great saphenous vein was also interrogated. Spectral Doppler was utilized to evaluate flow at rest and with distal augmentation maneuvers in the common femoral, femoral and popliteal veins. COMPARISON:  None. FINDINGS: RIGHT LOWER EXTREMITY Common Femoral Vein: There is good flow in the right common femoral vein. The vein is not completely compressible by ultrasound and there appears to be a component of a mild amount mural thrombus/wall thickening suggestive of probable prior DVT/chronic thrombus. Saphenofemoral Junction: No evidence of thrombus. Normal compressibility and flow on color Doppler imaging. Profunda Femoral Vein: No evidence of thrombus. Normal compressibility and flow on color Doppler imaging. Femoral Vein: Good flow is present  in the femoral vein throughout the thigh. The proximal femoral vein demonstrates similar findings to the common  femoral vein with suggestion of mild wall thickening/mural thrombus causing lack of complete compressibility. The vein is nearly completely compressible and this may represent the residua of prior DVT. This does not have the appearance of acute thrombus. Popliteal Vein: No evidence of thrombus. Normal compressibility, respiratory phasicity and response to augmentation. Calf Veins: No evidence of thrombus. Normal compressibility and flow on color Doppler imaging. Superficial Great Saphenous Vein: No evidence of thrombus. Normal compressibility. Venous Reflux:  None. Other Findings: Prominent adjacent right inguinal lymph nodes with the largest measuring 2.7 cm in short axis. Right inguinal nodes demonstrate fatty hila. These may be reactive nodes. Correlation suggested with any other palpable lymphadenopathy clinically. No evidence of superficial thrombophlebitis. LEFT LOWER EXTREMITY Common Femoral Vein: No evidence of thrombus. Normal compressibility, respiratory phasicity and response to augmentation. Saphenofemoral Junction: No evidence of thrombus. Normal compressibility and flow on color Doppler imaging. Profunda Femoral Vein: No evidence of thrombus. Normal compressibility and flow on color Doppler imaging. Femoral Vein: No evidence of thrombus. Normal compressibility, respiratory phasicity and response to augmentation. Popliteal Vein: No evidence of thrombus. Normal compressibility, respiratory phasicity and response to augmentation. Calf Veins: No evidence of thrombus. Normal compressibility and flow on color Doppler imaging. Superficial Great Saphenous Vein: No evidence of thrombus. Normal compressibility. Venous Reflux:  None. Other Findings: No evidence of superficial thrombophlebitis or abnormal fluid collection. No enlarged left inguinal lymph nodes identified. IMPRESSION: 1. Lack of complete compressibility of the right common femoral and proximal femoral veins due to mural thickening/chronic thrombus.  These veins demonstrate good flow and findings do not appear to represent acute thrombus. This most likely represents residua of prior DVT. 2. Enlarged right inguinal lymph nodes. Although enlarged, these nodes demonstrate normal morphology with fatty hila. These may be reactive nodes. Pathologic nodes are not completely excluded, however, and correlation suggested with physical examination for any other palpable lymphadenopathy or laboratory abnormalities suggestive of hematologic disease. Electronically Signed   By: Aletta Edouard M.D.   On: 07/14/2019 11:41    ASSESSMENT AND PLAN:   Active Problems:   Osteomyelitis ([place])  1.  Cellulitis right lower extremity with chronic osteomyelitis right ankle, with fever.  MRI right ankle.    vancomycin and cefepime at this time. ID consult. Patient today again refused for surgery.  Vascular was planning to do angiogram tomorrow.  I have also seen her and explained about need for amputation and without that her osteomyelitis is not going to heal as she had tried with multiple different course of antibiotics over the last 2 to 3 years. Later she decided to leave [place] and she told that she would go to [facility] where she has her doctors and will take their advice for surgery or angiogram.  2.  Pain right calf.   get an ultrasound of the right lower extremity to rule out DVT. 3.  Fibromyalgia on Neurontin at night and amitriptyline 4.  Chronic kidney disease stage III.  Gentle fluids.  Watch creatinine with antibiotics. 5.  Type 2 diabetes with Impaired fasting glucose.  Check a hemoglobin A1c and place on sliding scale 6.  Tobacco abuse smoking cessation counseling done 4 minutes by me.  Nicotine patch applied     All the records are reviewed and case discussed with Care Management/Social Workerr. Management plans discussed with the patient, family and they are in agreement.  CODE  STATUS: Full  TOTAL TIME TAKING CARE OF THIS  PATIENT: 35 minutes.     POSSIBLE D/C IN 1-2 DAYS, DEPENDING ON CLINICAL CONDITION.   Altamese Dilling M.D on 07/15/2019   Between 7am to 6pm - Pager - [phone]  After 6pm go to [email or web address] - password [email or web address]  [facility]  Office  [phone]  CC: Primary care physician; Patient, No Pcp Per  Note: This dictation was prepared with Dragon dictation along with smaller phrase technology. Any transcriptional errors that result from this process are unintentional.

## 2019-07-16 SURGERY — LOWER EXTREMITY ANGIOGRAPHY
Anesthesia: Moderate Sedation | Laterality: Right

## 2019-07-18 LAB — CULTURE, BLOOD (ROUTINE X 2)
Culture: NO GROWTH
Culture: NO GROWTH
Special Requests: ADEQUATE
Special Requests: ADEQUATE

## 2019-07-20 ENCOUNTER — Other Ambulatory Visit: Payer: Self-pay

## 2019-07-20 ENCOUNTER — Ambulatory Visit
Admission: EM | Admit: 2019-07-20 | Discharge: 2019-07-20 | Disposition: A | Discharge status: Other Acute Inpatient Hospital | Payer: Medicaid Other | Attending: Family Medicine | Admitting: Family Medicine

## 2019-07-20 DIAGNOSIS — M869 Osteomyelitis, unspecified: Secondary | ICD-10-CM

## 2019-07-20 NOTE — ED Triage Notes (Signed)
Patient complains of right foot pain that started one week ago, concerned for infection.

## 2019-07-20 NOTE — ED Provider Notes (Signed)
[facility]    CSN: [id number] Arrival date & time: 07/20/19  1346      History   Chief Complaint Chief Complaint  Patient presents with  . Foot Pain    HPI Jackie STENNER is a 53 y.o. female.   53 yo female with a h/o right foot osteomyelitis presents with worsening swelling of her right leg. Per medical records patient left the hospital against medical advice one week ago after being admitted for acute on chronic right leg osteomyelitis. Patient has refused surgery in the past as well. Denies any fevers, chills.    Foot Pain    Past Medical History:  Diagnosis Date  . Asthma   . Diabetes mellitus without complication ([place])   . Fibromyalgia   . H/O percutaneous left heart catheterization   . HLD (hyperlipidemia)   . Hypertension   . Shingles     Patient Active Problem List   Diagnosis Date Noted  . Non-healing ulcer of right foot ([place]) 11/20/2018  . Tobacco use disorder 11/06/2018  . Delusional disorder ([place]) 11/04/2018  . Cellulitis 09/07/2017  . HTN (hypertension) 05/28/2017  . Asthma 05/28/2017  . HLD (hyperlipidemia) 05/28/2017  . AKI (acute kidney injury) ([place]) 05/28/2017  . 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 Cellulitis of foot, right   . Pressure injury of skin 02/14/2017  . Osteomyelitis ([place]) 02/21/2016  . Foot infection 01/22/2016  . Sepsis ([place]) 01/12/2016    Past Surgical History:  Procedure Laterality Date  . toe amputation      OB History   No obstetric history on file.      Home Medications    Prior to Admission medications   Medication Sig Start Date End Date Taking? Authorizing Provider  amitriptyline (ELAVIL) 100 MG tablet Take 1 tablet (100 mg total) by mouth at bedtime. 11/06/18  Yes Pucilowska, Jolanta B, MD  gabapentin (NEURONTIN) 600 MG tablet Take 1 tablet (600 mg total) by mouth 2 (two) times daily. Patient taking differently: Take 800 mg by mouth 3 (three) times daily.  11/06/18  Yes  Pucilowska, Jolanta B, MD  amoxicillin-clavulanate (AUGMENTIN) 875-125 MG tablet Take 1 tablet by mouth 2 (two) times daily. 07/15/19   Vaughan Basta, MD  oxyCODONE-acetaminophen (PERCOCET/ROXICET) 5-325 MG tablet Take 1 tablet by mouth every 4 (four) hours as needed for severe pain.    [provider]    Family History Family History  Problem Relation Age of Onset  . Diabetes Mother   . Diabetes Sister   . CAD Father   . Cancer Father     Social History Social History   Tobacco Use  . Smoking status: Current Every Day Smoker    Packs/day: 0.25  . Smokeless tobacco: Never Used  Substance Use Topics  . Alcohol use: No  . Drug use: No     Allergies   Ibuprofen   Review of Systems Review of Systems   Physical Exam Triage Vital Signs ED Triage Vitals  Enc Vitals Group     BP 07/20/19 1407 99/60     Pulse Rate 07/20/19 1407 89     Resp 07/20/19 1407 16     Temp 07/20/19 1407 98 F (36.7 C)     Temp Source 07/20/19 1407 Oral     SpO2 07/20/19 1407 100 %     Weight 07/20/19 1405 200 lb (90.7 kg)     Height 07/20/19 1405 5\' 8"  (1.727 m)  Head Circumference --      Peak Flow --      Pain Score 07/20/19 1405 7     Pain Loc --      Pain Edu? --      Excl. in GC? --    No data found.  Updated Vital Signs BP 99/60 (BP Location: Right Arm)   Pulse 89   Temp 98 F (36.7 C) (Oral)   Resp 16   Ht 5\' 8"  (1.727 m)   Wt 90.7 kg   SpO2 100%   BMI 30.41 kg/m   Visual Acuity Right Eye Distance:   Left Eye Distance:   Bilateral Distance:    Right Eye Near:   Left Eye Near:    Bilateral Near:     Physical Exam Vitals signs and nursing note reviewed.  Constitutional:      General: She is not in acute distress.    Appearance: She is not toxic-appearing or diaphoretic.  Musculoskeletal:     Comments: Right foot and lower leg edematous, with ulceration, and dark/dusky, cool  Neurological:     Mental Status: She is alert.      UC  Treatments / Results  Labs (all labs ordered are listed, but only abnormal results are displayed) Labs Reviewed - No data to display  EKG   Radiology No results found.  Procedures Procedures (including critical care time)  Medications Ordered in UC Medications - No data to display  Initial Impression / Assessment and Plan / UC Course  I have reviewed the triage vital signs and the nursing notes.  Pertinent labs & imaging results that were available during my care of the patient were reviewed by me and considered in my medical decision making (see chart for details).      Final Clinical Impressions(s) / UC Diagnoses   Final diagnoses:  Leg osteomyelitis, right [facility])     Discharge Instructions     Recommend patient go to Emergency Department at the hospital for further evaluation and management    ED Prescriptions    None      1. diagnosis reviewed with patient; recommend patient go to Emergency Department for further evaluation and management  PDMP not reviewed this encounter.   [facility], MD 07/20/19 2009

## 2019-07-20 NOTE — Discharge Instructions (Signed)
Recommend patient go to Emergency Department at the hospital for further evaluation and management

## 2021-04-07 IMAGING — US US EXTREM LOW VENOUS
1 series · 12 of 24 positions shown · non-contrast
Comparison: None.

CLINICAL DATA: Right lower extremity pain.



[Series 1: us extrem low venous · 12 of 59 slices shown]
[im 3/59]
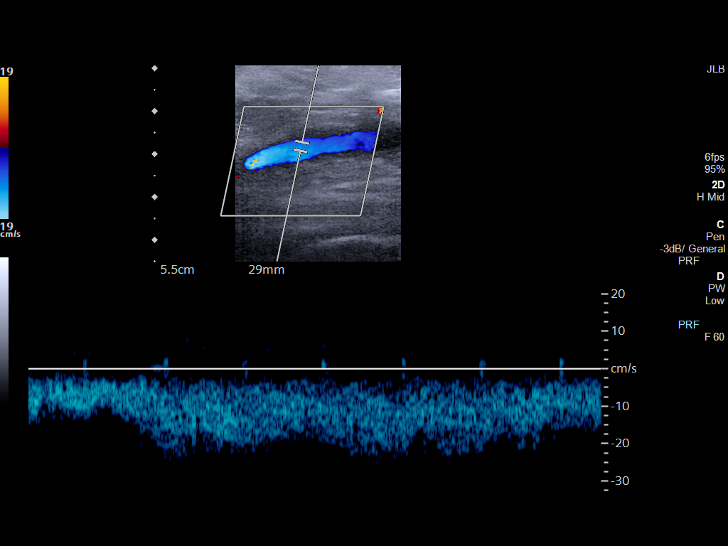
[im 8/59]
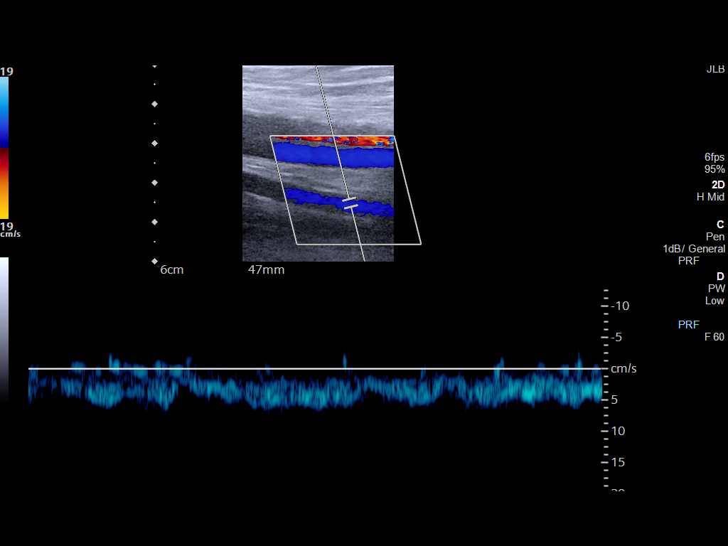
[im 13/59]
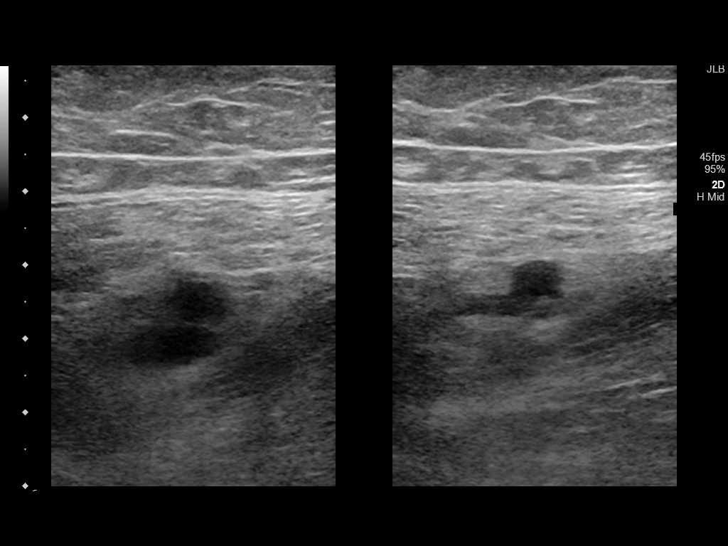
[im 18/59]
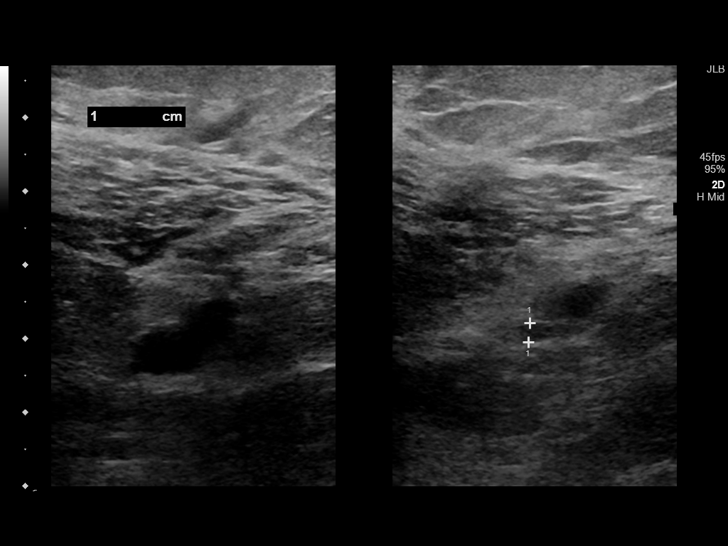
[im 23/59]
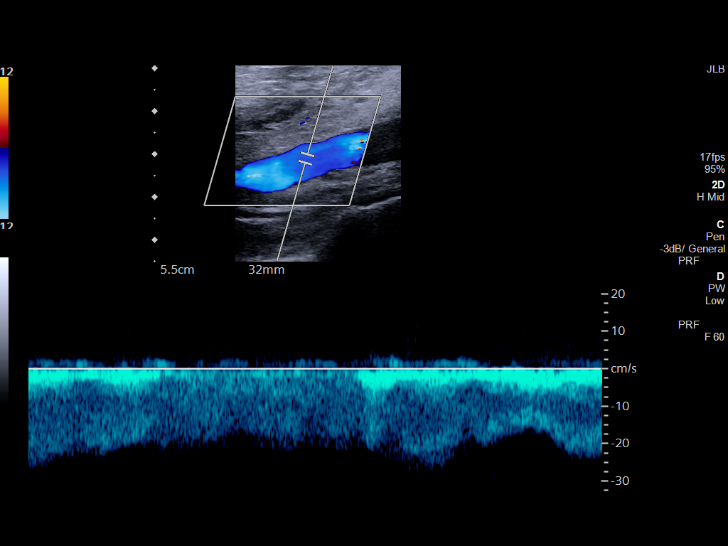
[im 28/59]
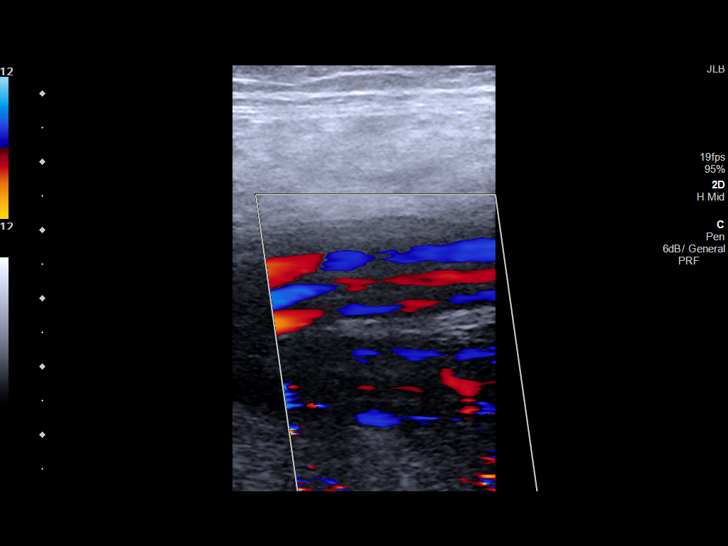
[im 33/59]
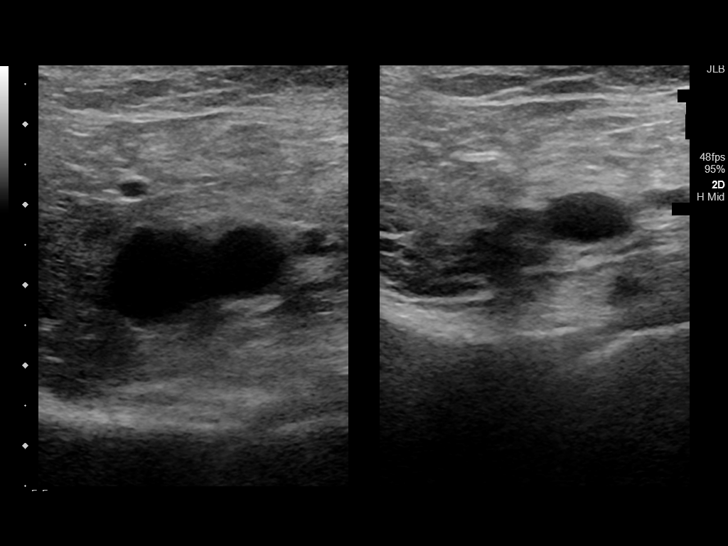
[im 38/59]
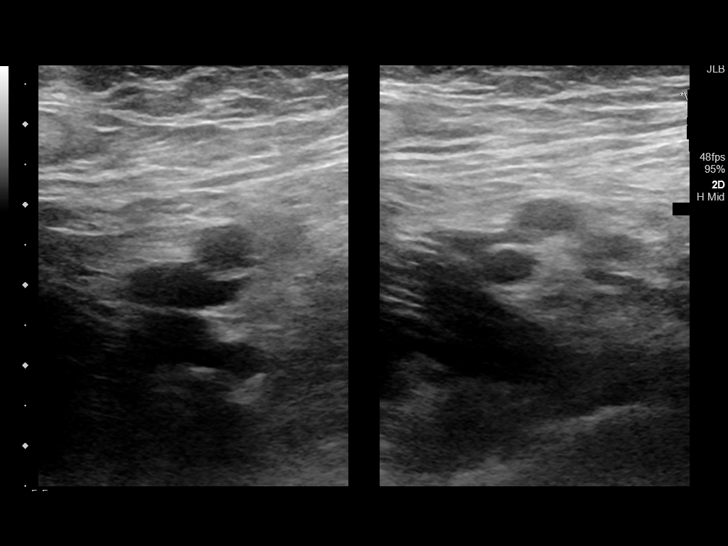
[im 43/59]
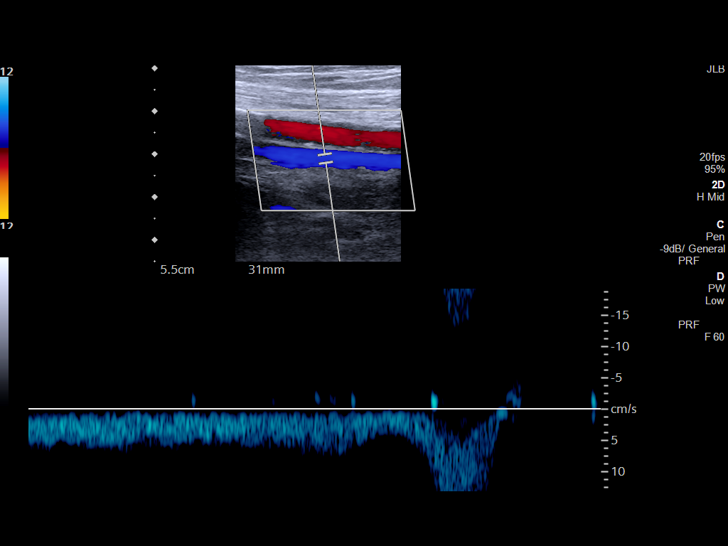
[im 48/59]
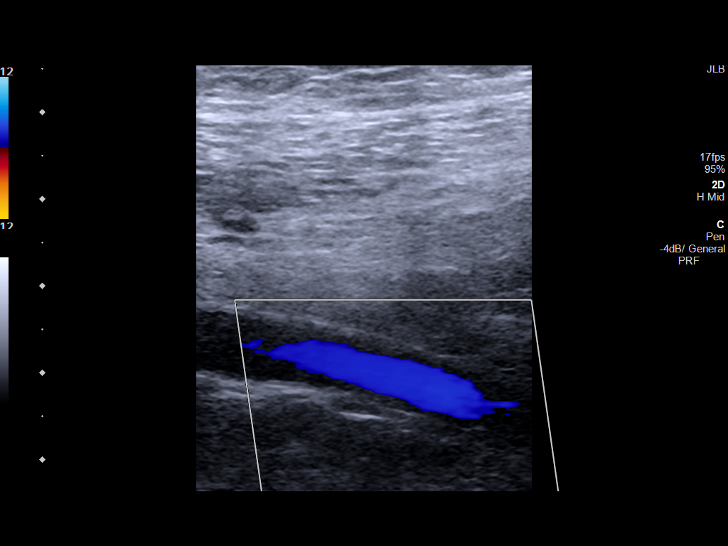
[im 53/59]
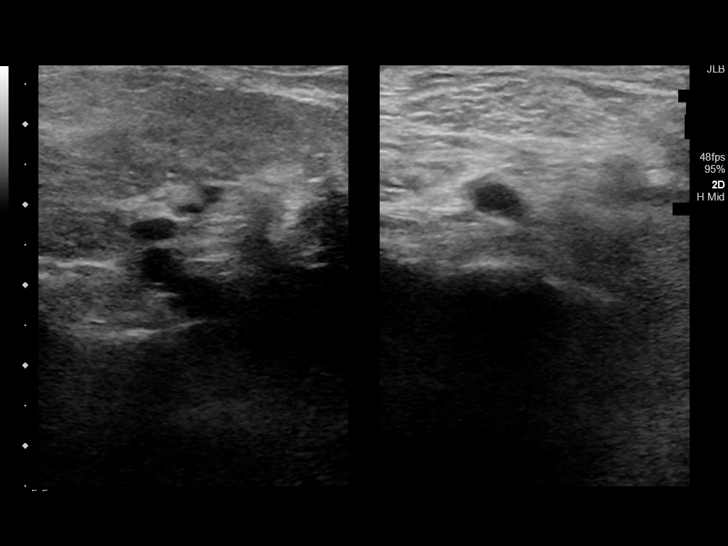
[im 59/59]
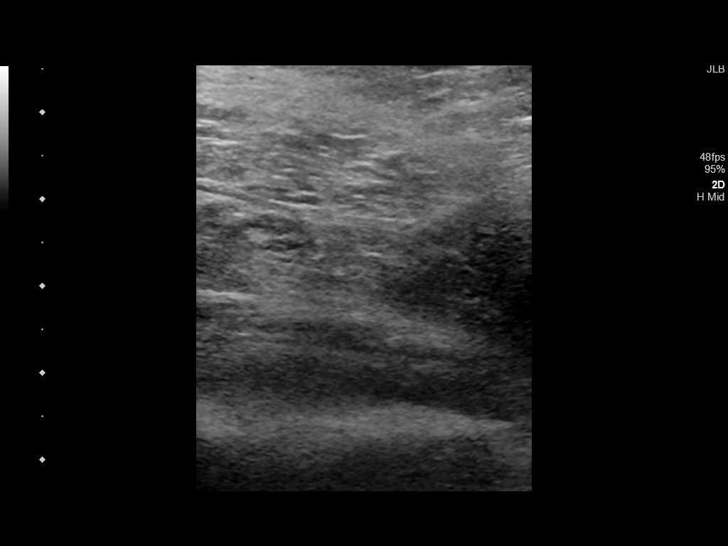

[12 of 24 positions shown; findings below may reference images not displayed]

FINDINGS: RIGHT LOWER EXTREMITY

Common Femoral Vein: There is good flow in the right common femoral
vein. The vein is not completely compressible by ultrasound and
there appears to be a component of a mild amount mural thrombus/wall
thickening suggestive of probable prior DVT/chronic thrombus.

Saphenofemoral Junction: No evidence of thrombus. Normal
compressibility and flow on color Doppler imaging.

Profunda Femoral Vein: No evidence of thrombus. Normal
compressibility and flow on color Doppler imaging.

Femoral Vein: Good flow is present in the femoral vein throughout
the thigh. The proximal femoral vein demonstrates similar findings
to the common femoral vein with suggestion of mild wall
thickening/mural thrombus causing lack of complete compressibility.
The vein is nearly completely compressible and this may represent
the residua of prior DVT. This does not have the appearance of acute
thrombus.

Popliteal Vein: No evidence of thrombus. Normal compressibility,
respiratory phasicity and response to augmentation.

Calf Veins: No evidence of thrombus. Normal compressibility and flow
on color Doppler imaging.

Superficial Great Saphenous Vein: No evidence of thrombus. Normal
compressibility.

Venous Reflux:  None.

Other Findings: Prominent adjacent right inguinal lymph nodes with
the largest measuring 2.7 cm in short axis. Right inguinal nodes
demonstrate fatty hila. These may be reactive nodes. Correlation
suggested with any other palpable lymphadenopathy clinically. No
evidence of superficial thrombophlebitis.

LEFT LOWER EXTREMITY

Common Femoral Vein: No evidence of thrombus. Normal
compressibility, respiratory phasicity and response to augmentation.

Saphenofemoral Junction: No evidence of thrombus. Normal
compressibility and flow on color Doppler imaging.

Profunda Femoral Vein: No evidence of thrombus. Normal
compressibility and flow on color Doppler imaging.

Femoral Vein: No evidence of thrombus. Normal compressibility,
respiratory phasicity and response to augmentation.

Popliteal Vein: No evidence of thrombus. Normal compressibility,
respiratory phasicity and response to augmentation.

Calf Veins: No evidence of thrombus. Normal compressibility and flow
on color Doppler imaging.

Superficial Great Saphenous Vein: No evidence of thrombus. Normal
compressibility.

Venous Reflux:  None.

Other Findings: No evidence of superficial thrombophlebitis or
abnormal fluid collection. No enlarged left inguinal lymph nodes
identified.
IMPRESSION: 1. Lack of complete compressibility of the right common femoral and
proximal femoral veins due to mural thickening/chronic thrombus.
These veins demonstrate good flow and findings do not appear to
represent acute thrombus. This most likely represents residua of
prior DVT.
2. Enlarged right inguinal lymph nodes. Although enlarged, these
nodes demonstrate normal morphology with fatty hila. These may be
reactive nodes. Pathologic nodes are not completely excluded,
however, and correlation suggested with physical examination for any
other palpable lymphadenopathy or laboratory abnormalities
suggestive of hematologic disease.

## 2023-02-27 DIAGNOSIS — E1165 Type 2 diabetes mellitus with hyperglycemia: Secondary | ICD-10-CM | POA: Diagnosis not present

## 2023-02-27 DIAGNOSIS — R42 Dizziness and giddiness: Secondary | ICD-10-CM | POA: Diagnosis not present

## 2023-02-28 DIAGNOSIS — R42 Dizziness and giddiness: Secondary | ICD-10-CM | POA: Diagnosis not present

## 2023-07-30 DIAGNOSIS — R109 Unspecified abdominal pain: Secondary | ICD-10-CM | POA: Diagnosis not present

## 2023-07-30 DIAGNOSIS — N3001 Acute cystitis with hematuria: Secondary | ICD-10-CM | POA: Diagnosis not present

## 2023-08-10 DIAGNOSIS — E1165 Type 2 diabetes mellitus with hyperglycemia: Secondary | ICD-10-CM | POA: Diagnosis not present

## 2023-08-10 DIAGNOSIS — R109 Unspecified abdominal pain: Secondary | ICD-10-CM | POA: Diagnosis not present

## 2023-08-10 DIAGNOSIS — R1032 Left lower quadrant pain: Secondary | ICD-10-CM | POA: Diagnosis not present

## 2023-08-12 DIAGNOSIS — Z9989 Dependence on other enabling machines and devices: Secondary | ICD-10-CM | POA: Diagnosis not present

## 2023-08-12 DIAGNOSIS — E785 Hyperlipidemia, unspecified: Secondary | ICD-10-CM | POA: Diagnosis not present

## 2023-08-12 DIAGNOSIS — Z886 Allergy status to analgesic agent status: Secondary | ICD-10-CM | POA: Diagnosis not present

## 2023-08-12 DIAGNOSIS — M797 Fibromyalgia: Secondary | ICD-10-CM | POA: Diagnosis not present

## 2023-08-12 DIAGNOSIS — G4733 Obstructive sleep apnea (adult) (pediatric): Secondary | ICD-10-CM | POA: Diagnosis not present

## 2023-08-12 DIAGNOSIS — I880 Nonspecific mesenteric lymphadenitis: Secondary | ICD-10-CM | POA: Diagnosis not present

## 2023-08-12 DIAGNOSIS — K219 Gastro-esophageal reflux disease without esophagitis: Secondary | ICD-10-CM | POA: Diagnosis not present

## 2023-08-12 DIAGNOSIS — D735 Infarction of spleen: Secondary | ICD-10-CM | POA: Diagnosis not present

## 2023-08-12 DIAGNOSIS — Z8719 Personal history of other diseases of the digestive system: Secondary | ICD-10-CM | POA: Diagnosis not present

## 2023-08-12 DIAGNOSIS — R10819 Abdominal tenderness, unspecified site: Secondary | ICD-10-CM | POA: Diagnosis not present

## 2023-08-12 DIAGNOSIS — R1031 Right lower quadrant pain: Secondary | ICD-10-CM | POA: Diagnosis not present

## 2023-08-12 DIAGNOSIS — E119 Type 2 diabetes mellitus without complications: Secondary | ICD-10-CM | POA: Diagnosis not present

## 2023-08-12 DIAGNOSIS — N12 Tubulo-interstitial nephritis, not specified as acute or chronic: Secondary | ICD-10-CM | POA: Diagnosis not present

## 2023-08-12 DIAGNOSIS — Z833 Family history of diabetes mellitus: Secondary | ICD-10-CM | POA: Diagnosis not present

## 2023-08-12 DIAGNOSIS — I10 Essential (primary) hypertension: Secondary | ICD-10-CM | POA: Diagnosis not present

## 2023-08-12 DIAGNOSIS — Z87891 Personal history of nicotine dependence: Secondary | ICD-10-CM | POA: Diagnosis not present

## 2023-08-12 DIAGNOSIS — R9389 Abnormal findings on diagnostic imaging of other specified body structures: Secondary | ICD-10-CM | POA: Diagnosis not present

## 2023-09-03 DIAGNOSIS — F17210 Nicotine dependence, cigarettes, uncomplicated: Secondary | ICD-10-CM | POA: Diagnosis not present

## 2023-09-03 DIAGNOSIS — K5909 Other constipation: Secondary | ICD-10-CM | POA: Diagnosis not present

## 2023-09-03 DIAGNOSIS — E785 Hyperlipidemia, unspecified: Secondary | ICD-10-CM | POA: Diagnosis not present

## 2023-09-03 DIAGNOSIS — I10 Essential (primary) hypertension: Secondary | ICD-10-CM | POA: Diagnosis not present

## 2023-09-03 DIAGNOSIS — E1165 Type 2 diabetes mellitus with hyperglycemia: Secondary | ICD-10-CM | POA: Diagnosis not present

## 2023-09-03 DIAGNOSIS — Z7984 Long term (current) use of oral hypoglycemic drugs: Secondary | ICD-10-CM | POA: Diagnosis not present

## 2023-09-03 DIAGNOSIS — R1084 Generalized abdominal pain: Secondary | ICD-10-CM | POA: Diagnosis not present

## 2023-09-03 DIAGNOSIS — R109 Unspecified abdominal pain: Secondary | ICD-10-CM | POA: Diagnosis not present

## 2023-09-03 DIAGNOSIS — K219 Gastro-esophageal reflux disease without esophagitis: Secondary | ICD-10-CM | POA: Diagnosis not present

## 2023-09-03 DIAGNOSIS — M797 Fibromyalgia: Secondary | ICD-10-CM | POA: Diagnosis not present

## 2023-09-03 DIAGNOSIS — R739 Hyperglycemia, unspecified: Secondary | ICD-10-CM | POA: Diagnosis not present

## 2023-10-07 ENCOUNTER — Other Ambulatory Visit: Payer: Self-pay

## 2023-10-07 ENCOUNTER — Emergency Department: Payer: 59

## 2023-10-07 ENCOUNTER — Emergency Department
Admission: EM | Admit: 2023-10-07 | Discharge: 2023-10-07 | Disposition: A | Discharge status: Home / Self Care | Payer: 59 | Attending: Student in an Organized Health Care Education/Training Program | Admitting: Student in an Organized Health Care Education/Training Program

## 2023-10-07 DIAGNOSIS — M1611 Unilateral primary osteoarthritis, right hip: Secondary | ICD-10-CM | POA: Diagnosis not present

## 2023-10-07 DIAGNOSIS — M549 Dorsalgia, unspecified: Secondary | ICD-10-CM | POA: Diagnosis not present

## 2023-10-07 DIAGNOSIS — E119 Type 2 diabetes mellitus without complications: Secondary | ICD-10-CM | POA: Diagnosis not present

## 2023-10-07 DIAGNOSIS — M545 Low back pain, unspecified: Secondary | ICD-10-CM | POA: Insufficient documentation

## 2023-10-07 DIAGNOSIS — F17200 Nicotine dependence, unspecified, uncomplicated: Secondary | ICD-10-CM | POA: Insufficient documentation

## 2023-10-07 DIAGNOSIS — M25551 Pain in right hip: Secondary | ICD-10-CM | POA: Diagnosis not present

## 2023-10-07 DIAGNOSIS — J45909 Unspecified asthma, uncomplicated: Secondary | ICD-10-CM | POA: Insufficient documentation

## 2023-10-07 DIAGNOSIS — I10 Essential (primary) hypertension: Secondary | ICD-10-CM | POA: Insufficient documentation

## 2023-10-07 DIAGNOSIS — M79604 Pain in right leg: Secondary | ICD-10-CM | POA: Insufficient documentation

## 2023-10-07 LAB — CBC WITH DIFFERENTIAL/PLATELET
Abs Immature Granulocytes: 0.08 10*3/uL — ABNORMAL HIGH (ref 0.00–0.07)
Basophils Absolute: 0.1 10*3/uL (ref 0.0–0.1)
Basophils Relative: 0 %
Eosinophils Absolute: 0.5 10*3/uL (ref 0.0–0.5)
Eosinophils Relative: 4 %
HCT: 40.5 % (ref 36.0–46.0)
Hemoglobin: 13.2 g/dL (ref 12.0–15.0)
Immature Granulocytes: 1 %
Lymphocytes Relative: 27 %
Lymphs Abs: 3.4 10*3/uL (ref 0.7–4.0)
MCH: 28.7 pg (ref 26.0–34.0)
MCHC: 32.6 g/dL (ref 30.0–36.0)
MCV: 88 fL (ref 80.0–100.0)
Monocytes Absolute: 0.7 10*3/uL (ref 0.1–1.0)
Monocytes Relative: 5 %
Neutro Abs: 7.9 10*3/uL — ABNORMAL HIGH (ref 1.7–7.7)
Neutrophils Relative %: 63 %
Platelets: 350 10*3/uL (ref 150–400)
RBC: 4.6 MIL/uL (ref 3.87–5.11)
RDW: 13.7 % (ref 11.5–15.5)
WBC: 12.5 10*3/uL — ABNORMAL HIGH (ref 4.0–10.5)
nRBC: 0 % (ref 0.0–0.2)

## 2023-10-07 LAB — BASIC METABOLIC PANEL
Anion gap: 14 (ref 5–15)
BUN: 23 mg/dL — ABNORMAL HIGH (ref 6–20)
CO2: 22 mmol/L (ref 22–32)
Calcium: 9.5 mg/dL (ref 8.9–10.3)
Chloride: 99 mmol/L (ref 98–111)
Creatinine, Ser: 1.74 mg/dL — ABNORMAL HIGH (ref 0.44–1.00)
GFR, Estimated: 33 mL/min — ABNORMAL LOW (ref >60)
Glucose, Bld: 268 mg/dL — ABNORMAL HIGH (ref 70–99)
Potassium: 4.3 mmol/L (ref 3.5–5.1)
Sodium: 135 mmol/L (ref 135–145)

## 2023-10-07 LAB — LACTIC ACID, PLASMA: Lactic Acid, Venous: 3.2 mmol/L (ref 0.5–1.9)

## 2023-10-07 MED ORDER — AMOXICILLIN-POT CLAVULANATE 875-125 MG PO TABS
1.0000 | ORAL_TABLET | Freq: Once | ORAL | Status: AC
  Administered 2023-10-07: 1 via ORAL
  Filled 2023-10-07: qty 1

## 2023-10-07 MED ORDER — OXYCODONE-ACETAMINOPHEN 5-325 MG PO TABS
1.0000 | ORAL_TABLET | Freq: Once | ORAL | Status: AC
  Administered 2023-10-07: 1 via ORAL
  Filled 2023-10-07: qty 1

## 2023-10-07 MED ORDER — AMOXICILLIN-POT CLAVULANATE 875-125 MG PO TABS: 1.0000 | ORAL_TABLET | Freq: Two times a day (BID) | ORAL | 0 refills | Status: AC

## 2023-10-07 MED ORDER — AMOXICILLIN-POT CLAVULANATE 875-125 MG PO TABS: 1.0000 | ORAL_TABLET | Freq: Two times a day (BID) | ORAL | 0 refills | Status: DC

## 2023-10-07 NOTE — ED Provider Notes (Signed)
 [facility] Provider Note    Event Date/Time   First MD Initiated Contact with Patient 10/07/23 1745     (approximate)   History   Back Pain and Leg Pain   HPI  Jackie Miles is a 58 y.o. female who presents today for evaluation of right sided low back pain that radiates down her right leg for the past 1 month.  Patient reports that she has a chronic injury to her right lower extremity for which she did not want any surgery done, and she wears a boot daily for the past 5 years.  She reports that she removes her boot every night.  She denies any new injuries.  She reports that she has had pain in her groin for the last 1 month.  She is able to ambulate.  She has not had any fevers or chills.  She went to [facility] and was told that she had lymph nodes in her groin.  She is wondering if she has a kidney infection as she had a kidney infection 1 to 2 months ago.  She denies any burning with urination.  She also reports that she has mild right sided low back pain.  She denies any new weakness or paresthesias in her lower extremities.  No urinary or fecal incontinence or retention.  No saddle anesthesia.  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)    Cellulitis of foot, right    Pressure injury of skin 02/14/2017   Osteomyelitis (HCC) 02/21/2016   Foot infection 01/22/2016   Sepsis (HCC) 01/12/2016          Physical Exam   Triage Vital Signs: ED Triage Vitals  Encounter Vitals Group     BP 10/07/23 1452 128/75     Systolic BP Percentile --      Diastolic BP Percentile --      Pulse Rate 10/07/23 1452  (!) 107     Resp 10/07/23 1452 17     Temp 10/07/23 1452 97.9 F (36.6 C)     Temp src --      SpO2 10/07/23 1452 99 %     Weight 10/07/23 1453 195 lb (88.5 kg)     Height 10/07/23 1453 5' 8 (1.727 m)     Head Circumference --      Peak Flow --      Pain Score 10/07/23 1453 10     Pain Loc --      Pain Education --      Exclude from Growth Chart --     Most recent vital signs: Vitals:   10/07/23 1452  BP: 128/75  Pulse: (!) 107  Resp: 17  Temp: 97.9 F (36.6 C)  SpO2: 99%    Physical Exam Vitals and nursing note reviewed.  Constitutional:      General: Awake and alert. No acute distress.    Appearance: Normal appearance. The patient is overweight.  HENT:     Head: Normocephalic and atraumatic.     Mouth: Mucous membranes are moist.  Eyes:     General: PERRL. Normal EOMs  Right eye: No discharge.        Left eye: No discharge.     Conjunctiva/sclera: Conjunctivae normal.  Cardiovascular:     Rate and Rhythm: Normal rate and regular rhythm.     Pulses: Normal pulses.  Pulmonary:     Effort: Pulmonary effort is normal. No respiratory distress.     Breath sounds: Normal breath sounds.  Abdominal:     Abdomen is soft. There is no abdominal tenderness. No rebound or guarding. No distention. Musculoskeletal:        General: No swelling. Normal range of motion.     Cervical back: Normal range of motion and neck supple.  Right lower extremity in cam boot and Ace wrap.  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  No open wounds.  Foot is equal in temperature to opposite foot.  Palpable pedal pulses by RN.  No erythema noted to the rest of the leg.  No palpable lymph nodes in the inguinal region.  Negative logroll.  Pelvis stable.  Compartment soft and compressible throughout.  No pitting edema. Skin:    General: Skin is warm and dry.     Capillary Refill: Capillary refill takes less than 2  seconds.     Findings: No rash.  Neurological:     Mental Status: The patient is awake and alert.            ED Results / Procedures / Treatments   Labs (all labs ordered are listed, but only abnormal results are displayed) Labs Reviewed  CBC WITH DIFFERENTIAL/PLATELET - Abnormal; Notable for the following components:      Result Value   WBC 12.5 (*)    Neutro Abs 7.9 (*)    Abs Immature Granulocytes 0.08 (*)    All other components within normal limits  BASIC METABOLIC PANEL  LACTIC ACID, PLASMA  LACTIC ACID, PLASMA  URINALYSIS, ROUTINE W REFLEX MICROSCOPIC     EKG     RADIOLOGY Pending at the time of pass-off    PROCEDURES:  Critical Care performed:   Procedures   MEDICATIONS ORDERED IN ED: Medications - No data to display   IMPRESSION / MDM / ASSESSMENT AND PLAN / ED COURSE  I reviewed the triage vital signs and the nursing notes.   Differential diagnosis includes, but is not limited to, infection, osteomyelitis, lumbar radiculopathy, DVT.  Patient presented to the emergency department awake and alert, mildly tachycardic to 107 though normotensive and afebrile.  I reviewed the patient's chart.  Patient was seen at outside emergency department on 08/12/2023 for right lower quadrant abdominal pain and diagnosed with mesenteric adenitis.  She was seen again on 09/03/2023 for groin pain and was ultimately discharged with a diagnosis of hyperglycemia.  No labs or imaging were ordered while patient was in triage.  Upon my evaluation, further workup is indicated.  Labs were obtained as well as imaging for evaluation of any acute on chronic illness.  She does not have any abdominal tenderness on exam today.  She has no vertebral tenderness, though has mild tenderness to her right paraspinal muscles without overlying skin changes.  She has normal strength and sensation of bilateral lower extremities.  Her legs are equal in circumference bilaterally, no warmth or  erythema, equal in temperature bilaterally.  Case was discussed with Dr. Lang, and patient was passed off to Dr. Lang pending results and final disposition.  Patient's  presentation is most consistent with acute complicated illness / injury requiring diagnostic workup.   FINAL CLINICAL IMPRESSION(S) / ED DIAGNOSES   Final diagnoses:  Right leg pain     Rx / DC Orders   ED Discharge Orders     None        Note:  This document was prepared using Dragon voice recognition software and may include unintentional dictation errors.   Adaora Mchaney E, PA-C 10/07/23 1852    Lang Dover, MD 10/07/23 TYRA    Lang Dover, MD 10/07/23 [phone]

## 2023-10-07 NOTE — ED Notes (Signed)
 Report given to [place], Charity fundraiser.

## 2023-10-07 NOTE — ED Triage Notes (Signed)
 Pt to ED for right lower back pain radiating down right leg x 1 month. Pt has right broken ankle x5 years, states will not let doctors fix it.

## 2023-10-07 NOTE — ED Provider Triage Note (Signed)
 Emergency Medicine Provider Triage Evaluation Note  Jackie Miles , a 58 y.o. female  was evaluated in triage.  Pt complains of right side back pain x 1 month  that radiates down right leg. She has been in a CAM walker for the past 5 years due to ankle fracture.   Physical Exam  BP 128/75   Pulse (!) 107   Temp 97.9 F (36.6 C)   Resp 17   Ht 5' 8 (1.727 m)   Wt 88.5 kg   SpO2 99%   BMI 29.65 kg/m  Gen:   Awake, no distress   Resp:  Normal effort  MSK:   Moves extremities without difficulty  Other:    Medical Decision Making  Medically screening exam initiated at 2:53 PM.  Appropriate orders placed.  Jackie Miles was informed that the remainder of the evaluation will be completed by another provider, this initial triage assessment does not replace that evaluation, and the importance of remaining in the ED until their evaluation is complete.     [name], FNP 10/07/23 1455

## 2023-10-08 ENCOUNTER — Emergency Department
Admission: EM | Admit: 2023-10-08 | Discharge: 2023-10-08 | Discharge status: Against Medical Advice | Payer: 59 | Attending: Family Medicine | Admitting: Family Medicine

## 2023-10-08 DIAGNOSIS — Z5321 Procedure and treatment not carried out due to patient leaving prior to being seen by health care provider: Secondary | ICD-10-CM | POA: Diagnosis not present

## 2023-10-08 DIAGNOSIS — T8140XA Infection following a procedure, unspecified, initial encounter: Secondary | ICD-10-CM | POA: Insufficient documentation

## 2023-10-08 DIAGNOSIS — Y838 Other surgical procedures as the cause of abnormal reaction of the patient, or of later complication, without mention of misadventure at the time of the procedure: Secondary | ICD-10-CM | POA: Diagnosis not present

## 2023-10-08 LAB — COMPREHENSIVE METABOLIC PANEL
ALT: 16 U/L (ref 0–44)
AST: 19 U/L (ref 15–41)
Albumin: 4 g/dL (ref 3.5–5.0)
Alkaline Phosphatase: 70 U/L (ref 38–126)
Anion gap: 11 (ref 5–15)
BUN: 25 mg/dL — ABNORMAL HIGH (ref 6–20)
CO2: 23 mmol/L (ref 22–32)
Calcium: 9.1 mg/dL (ref 8.9–10.3)
Chloride: 100 mmol/L (ref 98–111)
Creatinine, Ser: 1.55 mg/dL — ABNORMAL HIGH (ref 0.44–1.00)
GFR, Estimated: 38 mL/min — ABNORMAL LOW (ref >60)
Glucose, Bld: 315 mg/dL — ABNORMAL HIGH (ref 70–99)
Potassium: 4.4 mmol/L (ref 3.5–5.1)
Sodium: 134 mmol/L — ABNORMAL LOW (ref 135–145)
Total Bilirubin: 0.5 mg/dL (ref 0.0–1.2)
Total Protein: 7.7 g/dL (ref 6.5–8.1)

## 2023-10-08 LAB — CBC WITH DIFFERENTIAL/PLATELET
Abs Immature Granulocytes: 0.06 10*3/uL (ref 0.00–0.07)
Basophils Absolute: 0.1 10*3/uL (ref 0.0–0.1)
Basophils Relative: 1 %
Eosinophils Absolute: 0.6 10*3/uL — ABNORMAL HIGH (ref 0.0–0.5)
Eosinophils Relative: 5 %
HCT: 38.6 % (ref 36.0–46.0)
Hemoglobin: 12.9 g/dL (ref 12.0–15.0)
Immature Granulocytes: 1 %
Lymphocytes Relative: 24 %
Lymphs Abs: 2.7 10*3/uL (ref 0.7–4.0)
MCH: 28.6 pg (ref 26.0–34.0)
MCHC: 33.4 g/dL (ref 30.0–36.0)
MCV: 85.6 fL (ref 80.0–100.0)
Monocytes Absolute: 0.6 10*3/uL (ref 0.1–1.0)
Monocytes Relative: 5 %
Neutro Abs: 7.5 10*3/uL (ref 1.7–7.7)
Neutrophils Relative %: 64 %
Platelets: 343 10*3/uL (ref 150–400)
RBC: 4.51 MIL/uL (ref 3.87–5.11)
RDW: 13.6 % (ref 11.5–15.5)
WBC: 11.5 10*3/uL — ABNORMAL HIGH (ref 4.0–10.5)
nRBC: 0 % (ref 0.0–0.2)

## 2023-10-08 LAB — PROTIME-INR
INR: 1 (ref 0.8–1.2)
Prothrombin Time: 13.4 s (ref 11.4–15.2)

## 2023-10-08 LAB — LACTIC ACID, PLASMA: Lactic Acid, Venous: 2.3 mmol/L (ref 0.5–1.9)

## 2023-10-08 NOTE — ED Triage Notes (Signed)
 Pt reports here because the doctor told here she had an infection and needed IV antibiotics. Also reports needs some pain meds.  Left AMA yesterday.  Denies worsening of sx.

## 2023-10-08 NOTE — ED Provider Triage Note (Signed)
 Emergency Medicine Provider Triage Evaluation Note  Jackie Miles , a 58 y.o. female  was evaluated in triage.  Pt complains of pain in right hip and upper leg. Here yesterday for the same but signed out AMA. She didn't pick up the antibiotic or pain medication that was sent in. She states pain is getting worse and wants more antibiotics IV.  Physical Exam  BP 136/71   Pulse (!) 109   Temp 98.7 F (37.1 C)   Resp 16   Ht 5' 8 (1.727 m)   Wt 88 kg   SpO2 100%   BMI 29.50 kg/m  Gen:   Awake, no distress   Resp:  Normal effort  MSK:   Moves extremities without difficulty  Other:    Medical Decision Making  Medically screening exam initiated at 2:11 PM.  Appropriate orders placed.  Jackie Miles was informed that the remainder of the evaluation will be completed by another provider, this initial triage assessment does not replace that evaluation, and the importance of remaining in the ED until their evaluation is complete.  Tachycardia noted. Labs including lactic sent.   Jackie Kirk NOVAK, FNP 10/08/23 1417

## 2023-10-13 LAB — CULTURE, BLOOD (ROUTINE X 2): Culture: NO GROWTH

## 2024-01-09 ENCOUNTER — Encounter: Payer: Self-pay | Admitting: Family Medicine

## 2024-01-09 ENCOUNTER — Ambulatory Visit: Admitting: Family Medicine

## 2024-01-09 VITALS — BP 134/72 | HR 70 | Resp 16 | Ht 68.0 in | Wt 167.0 lb

## 2024-01-09 DIAGNOSIS — E1169 Type 2 diabetes mellitus with other specified complication: Secondary | ICD-10-CM

## 2024-01-09 DIAGNOSIS — Z0000 Encounter for general adult medical examination without abnormal findings: Secondary | ICD-10-CM

## 2024-01-09 DIAGNOSIS — L089 Local infection of the skin and subcutaneous tissue, unspecified: Secondary | ICD-10-CM | POA: Diagnosis not present

## 2024-01-09 DIAGNOSIS — Z789 Other specified health status: Secondary | ICD-10-CM

## 2024-01-09 DIAGNOSIS — I10 Essential (primary) hypertension: Secondary | ICD-10-CM | POA: Diagnosis not present

## 2024-01-09 DIAGNOSIS — I351 Nonrheumatic aortic (valve) insufficiency: Secondary | ICD-10-CM

## 2024-01-09 DIAGNOSIS — N1832 Chronic kidney disease, stage 3b: Secondary | ICD-10-CM

## 2024-01-09 DIAGNOSIS — E1159 Type 2 diabetes mellitus with other circulatory complications: Secondary | ICD-10-CM

## 2024-01-09 DIAGNOSIS — M869 Osteomyelitis, unspecified: Secondary | ICD-10-CM

## 2024-01-09 DIAGNOSIS — R269 Unspecified abnormalities of gait and mobility: Secondary | ICD-10-CM

## 2024-01-09 DIAGNOSIS — G629 Polyneuropathy, unspecified: Secondary | ICD-10-CM

## 2024-01-09 DIAGNOSIS — Z7409 Other reduced mobility: Secondary | ICD-10-CM

## 2024-01-09 DIAGNOSIS — E119 Type 2 diabetes mellitus without complications: Secondary | ICD-10-CM | POA: Insufficient documentation

## 2024-01-09 DIAGNOSIS — E785 Hyperlipidemia, unspecified: Secondary | ICD-10-CM

## 2024-01-09 DIAGNOSIS — L97519 Non-pressure chronic ulcer of other part of right foot with unspecified severity: Secondary | ICD-10-CM

## 2024-01-09 MED ORDER — BLOOD GLUCOSE MONITORING SUPPL DEVI: 0 refills | Status: DC

## 2024-01-09 MED ORDER — LANCETS MISC. MISC: 3 refills | Status: DC

## 2024-01-09 MED ORDER — BLOOD GLUCOSE TEST VI STRP: ORAL_STRIP | 3 refills | Status: DC

## 2024-01-09 MED ORDER — GABAPENTIN 300 MG PO CAPS: 300.0000 mg | ORAL_CAPSULE | Freq: Two times a day (BID) | ORAL | 3 refills | Status: DC

## 2024-01-09 NOTE — Progress Notes (Signed)
 Name: Jackie Miles   MRN: [id number]    DOB: Aug 09, 1966   Date:01/09/2024       Progress Note  Chief Complaint  Patient presents with   Establish Care     Subjective:   Jackie Miles is a 58 y.o. female, presents to clinic for routine follow up on chronic conditions  She is here with 2 family members, has not had a PCP in quite some time reports history of diabetes, hypertension, hyperlipidemia, neuropathy, foot infection  Last A1c in chart is many years ago Lab Results  Component Value Date   HGBA1C 6.2 (H) 07/13/2019  Last sugars 300 in ED visits  She does want a glucometer, states she used to be on a medication like glipizide or glimepiride she cannot remember exactly what it was called or what dose, she does not want to be on insulin and she refuses to have amputation, she declines providing a urine sample today for urine albumin creatinine testing but is willing to do blood work  Gabapentin [facility] - helps her sleep with neuropathy and pain -she has not been prescribed it in some time but has gotten some from people that have it and it is still a thing that helps her sleep  Losing weight unintentionally  Wt Readings from Last 5 Encounters:  01/09/24 167 lb (75.8 kg)  10/07/23 195 lb (88.5 kg)  07/20/19 200 lb (90.7 kg)  07/13/19 200 lb (90.7 kg)  01/13/19 182 lb 6.4 oz (82.7 kg)   BMI Readings from Last 5 Encounters:  01/09/24 25.39 kg/m  10/07/23 29.65 kg/m  07/20/19 30.41 kg/m  07/13/19 30.41 kg/m  01/13/19 27.73 kg/m     Here with two family members Seeing specialsits for foot infection/diabetic ulcers? Pt poor historian, she does eventually say seeing ortho and then referred to podiatry Request PCA assessment - pt in [facility] and boot, doing wound care, has acewrap on under boot     Current Outpatient Medications:    amitriptyline (ELAVIL) 100 MG tablet, Take 1 tablet (100 mg total) by mouth at bedtime., Disp: 30 tablet, Rfl: 0   gabapentin  (NEURONTIN) 600 MG tablet, Take 1 tablet (600 mg total) by mouth 2 (two) times daily. (Patient taking differently: Take 800 mg by mouth 3 (three) times daily.), Disp: 60 tablet, Rfl: 1   amoxicillin-clavulanate (AUGMENTIN) 875-125 MG tablet, Take 1 tablet by mouth 2 (two) times daily. (Patient not taking: Reported on 01/09/2024), Disp: 10 tablet, Rfl: 0   oxyCODONE-acetaminophen (PERCOCET/ROXICET) 5-325 MG tablet, Take 1 tablet by mouth every 4 (four) hours as needed for severe pain. 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)    Cellulitis of foot, right    Pressure injury of skin 02/14/2017   Osteomyelitis (HCC) 02/21/2016   Foot infection 01/22/2016   Sepsis (HCC) 01/12/2016    Past Surgical History:  Procedure Laterality Date   toe amputation      Family History  Problem Relation Age of Onset   Diabetes Mother  Diabetes Sister    CAD Father    Cancer Father     Social History   Tobacco Use   Smoking status: Every Day    Current packs/day: 0.25    Types: Cigarettes   Smokeless tobacco: Never  Vaping Use   Vaping status: Never Used  Substance Use Topics   Alcohol use: No   Drug use: No     Allergies  Allergen Reactions   Ibuprofen Other (See Comments)    Reaction:  Acid reflux, GI bleeding    Health Maintenance  Topic Date Due   Diabetic kidney evaluation - Urine ACR  Never done   Hepatitis C Screening  Never done   Lung Cancer Screening  Never done   FOOT EXAM  05/28/2018   HEMOGLOBIN A1C  01/11/2020    OPHTHALMOLOGY EXAM  01/09/2024 (Originally 05/27/1974)   COVID-19 Vaccine (3 - 2024-25 season) 01/25/2024 (Originally 06/01/2023)   Zoster Vaccines- Shingrix (1 of 2) 04/09/2024 (Originally 05/27/2014)   Cervical Cancer Screening (HPV/Pap Cotest)  01/08/2025 (Originally 05/27/1994)   Pneumococcal Vaccine 30-37 Years old (2 of 2 - PCV) 01/08/2025 (Originally 09/17/2012)   MAMMOGRAM  01/08/2025 (Originally 05/27/2014)   Colonoscopy  01/08/2025 (Originally 05/27/2009)   INFLUENZA VACCINE  04/30/2024   Diabetic kidney evaluation - eGFR measurement  10/07/2024   DTaP/Tdap/Td (2 - Td or Tdap) 01/10/2026   HIV Screening  Completed   HPV VACCINES  Aged Out   Meningococcal B Vaccine  Aged Out    Chart Review Today: I personally reviewed active problem list, medication list, allergies, family history, social history, health maintenance, notes from last encounter, lab results, imaging with the patient/caregiver today.   Review of Systems  Constitutional: Negative.   HENT: Negative.    Eyes: Negative.   Respiratory: Negative.    Cardiovascular: Negative.   Gastrointestinal: Negative.   Endocrine: Negative.   Genitourinary: Negative.   Musculoskeletal: Negative.   Skin: Negative.   Allergic/Immunologic: Negative.   Neurological: Negative.   Hematological: Negative.   Psychiatric/Behavioral: Negative.    All other systems reviewed and are negative.    Objective:   Vitals:   01/09/24 1446  BP: 134/72  Pulse: 70  Resp: 16  SpO2: 98%  Weight: 167 lb (75.8 kg)  Height: 5\' 8"  (1.727 m)    Body mass index is 25.39 kg/m.  Physical Exam Vitals and nursing note reviewed.  Constitutional:      General: She is not in acute distress.    Appearance: She is well-developed. She is not ill-appearing, toxic-appearing or diaphoretic.  HENT:     Head: Normocephalic and atraumatic.     Right Ear: External ear normal.     Left Ear: External ear normal.     Nose: Nose normal.  Eyes:     General:         Right eye: No discharge.        Left eye: No discharge.     Conjunctiva/sclera: Conjunctivae normal.  Neck:     Trachea: No tracheal deviation.  Cardiovascular:     Rate and Rhythm: Normal rate and regular rhythm.     Heart sounds: Normal heart sounds. No murmur heard.    No friction rub. No gallop.  Pulmonary:     Effort: Pulmonary effort is normal. No respiratory distress.     Breath sounds: Normal breath sounds. No stridor. No wheezing, rhonchi or rales.  Abdominal:     Palpations: Abdomen is soft.  Musculoskeletal:  Comments: Right boot and ace wrap/dressing   Skin:    General: Skin is warm and dry.     Findings: No rash.  Neurological:     Mental Status: She is alert.     Motor: No abnormal muscle tone.     Comments: In WC, non-ambulatory, moves bilateral UE normally  Psychiatric:        Mood and Affect: Mood normal.        Behavior: Behavior normal.      Functional Status Survey: Is the patient deaf or have difficulty hearing?: No Does the patient have difficulty seeing, even when wearing glasses/contacts?: No Does the patient have difficulty concentrating, remembering, or making decisions?: No Does the patient have difficulty walking or climbing stairs?: Yes Does the patient have difficulty dressing or bathing?: No Does the patient have difficulty doing errands alone such as visiting a doctor's office or shopping?: Yes Results for orders placed or performed during the hospital encounter of 10/07/23  Basic metabolic panel   Collection Time: 10/07/23  6:23 PM  Result Value Ref Range   Sodium 135 135 - 145 mmol/L   Potassium 4.3 3.5 - 5.1 mmol/L   Chloride 99 98 - 111 mmol/L   CO2 22 22 - 32 mmol/L   Glucose, Bld 268 (H) 70 - 99 mg/dL   BUN 23 (H) 6 - 20 mg/dL   Creatinine, Ser 4.69 (H) 0.44 - 1.00 mg/dL   Calcium 9.5 8.9 - 62.9 mg/dL   GFR, Estimated 33 (L) >60 mL/min   Anion gap 14 5 - 15  CBC with Differential   Collection Time: 10/07/23  6:23 PM   Result Value Ref Range   WBC 12.5 (H) 4.0 - 10.5 K/uL   RBC 4.60 3.87 - 5.11 MIL/uL   Hemoglobin 13.2 12.0 - 15.0 g/dL   HCT 52.8 41.3 - 24.4 %   MCV 88.0 80.0 - 100.0 fL   MCH 28.7 26.0 - 34.0 pg   MCHC 32.6 30.0 - 36.0 g/dL   RDW 01.0 27.2 - 53.6 %   Platelets 350 150 - 400 K/uL   nRBC 0.0 0.0 - 0.2 %   Neutrophils Relative % 63 %   Neutro Abs 7.9 (H) 1.7 - 7.7 K/uL   Lymphocytes Relative 27 %   Lymphs Abs 3.4 0.7 - 4.0 K/uL   Monocytes Relative 5 %   Monocytes Absolute 0.7 0.1 - 1.0 K/uL   Eosinophils Relative 4 %   Eosinophils Absolute 0.5 0.0 - 0.5 K/uL   Basophils Relative 0 %   Basophils Absolute 0.1 0.0 - 0.1 K/uL   Immature Granulocytes 1 %   Abs Immature Granulocytes 0.08 (H) 0.00 - 0.07 K/uL  Lactic acid, plasma   Collection Time: 10/07/23  6:23 PM  Result Value Ref Range   Lactic Acid, Venous 3.2 (HH) 0.5 - 1.9 mmol/L      Assessment & Plan:     ICD-10-CM   1. Encounter for medical examination to establish care  Z00.00 HgB A1c    Comprehensive Metabolic Panel (CMET)    Lipid Profile    CBC with Differential/Platelet   reviewed chart, care everywhere thoroughly and requested records from last PCP for continuity of care (likely no PCP for 4-5 years)    2. Type 2 diabetes mellitus with other specified complication, without long-term current use of insulin (HCC)  E11.69 HgB A1c    Comprehensive Metabolic Panel (CMET)    Lipid Profile    Blood Glucose Monitoring  Suppl DEVI    Glucose Blood (BLOOD GLUCOSE TEST STRIPS) STRP    Lancets Misc. MISC   unclear past management and meds, no records with her and she isn't sure of last meds, glucometer ordered, will obtain labs and close f/up to address    3. Foot infection  L08.9 CBC with Differential/Platelet   see below    4. Hypertension, unspecified type  I10    BP near goal, likely needs ACEI/ARB with DM, requesting records    5. Hyperlipidemia, unspecified hyperlipidemia type  E78.5 Comprehensive Metabolic  Panel (CMET)    Lipid Profile    Direct LDL    LDL cholesterol, direct    TEST AUTHORIZATION   will need to get on statin, checking labs    6. Stage 3b chronic kidney disease (HCC)  N18.32 Comprehensive Metabolic Panel (CMET)   needs labs and likely nephrology referral    7. Aortic valve insufficiency, etiology of cardiac valve disease unspecified  I35.1    per chart review and hx, past ECHO    8. Non-healing ulcer of right foot, unspecified ulcer stage (HCC)  L97.519     9. Osteomyelitis of right foot, unspecified type (HCC)  M86.9    seeing ortho and podiatry    10. Unspecified abnormalities of gait and mobility  R26.9    WC, non-ambulatory, requesting PCA assessment    11. Impaired mobility and ADLs  Z74.09    Z78.9    not ambulatory due to ulcers, infection, neuropathy, in [facility], needs home assessment for PCA per family request    12. Neuropathy  G62.9 gabapentin (NEURONTIN) 300 MG capsule        Return for 2 week f/up on labs/starting meds for dx .   Adeline Hone, PA-C 01/09/24 3:16 PM

## 2024-01-12 ENCOUNTER — Telehealth: Payer: Self-pay | Admitting: Family Medicine

## 2024-01-12 NOTE — Telephone Encounter (Unsigned)
 Copied from CRM [id number]. Topic: Clinical - Medication Refill >> Jan 12, 2024 12:05 PM Santiya F wrote: Most Recent Primary Care Visit:  Provider: Adeline Hone  Department: [facility]  Visit Type: NEW PT - OFFICE VISIT  Date: 01/09/2024  Medication: Glipizide  Has the patient contacted their pharmacy? Yes  (Agent: If yes, when and what did the pharmacy advise?) Contact office   Is this the correct pharmacy for this prescription? Yes If no, delete pharmacy and type the correct one.  This is the patient's preferred pharmacy:   [id number] - [place], [place] - [address] [place] [address] Phone: [phone] Fax: [phone]   Has the prescription been filled recently? No  Is the patient out of the medication? Yes  Has the patient been seen for an appointment in the last year OR does the patient have an upcoming appointment? Yes  Can we respond through MyChart? No  Agent: Please be advised that Rx refills may take up to 3 business days. We ask that you follow-up with your pharmacy.

## 2024-01-13 ENCOUNTER — Other Ambulatory Visit: Payer: Self-pay | Admitting: Family Medicine

## 2024-01-13 ENCOUNTER — Encounter: Payer: Self-pay | Admitting: Family Medicine

## 2024-01-13 DIAGNOSIS — I358 Other nonrheumatic aortic valve disorders: Secondary | ICD-10-CM

## 2024-01-13 DIAGNOSIS — I351 Nonrheumatic aortic (valve) insufficiency: Secondary | ICD-10-CM

## 2024-01-13 DIAGNOSIS — E1165 Type 2 diabetes mellitus with hyperglycemia: Secondary | ICD-10-CM

## 2024-01-13 DIAGNOSIS — E785 Hyperlipidemia, unspecified: Secondary | ICD-10-CM

## 2024-01-13 DIAGNOSIS — I10 Essential (primary) hypertension: Secondary | ICD-10-CM

## 2024-01-13 DIAGNOSIS — N1832 Chronic kidney disease, stage 3b: Secondary | ICD-10-CM | POA: Insufficient documentation

## 2024-01-13 DIAGNOSIS — I6789 Other cerebrovascular disease: Secondary | ICD-10-CM | POA: Insufficient documentation

## 2024-01-13 DIAGNOSIS — I341 Nonrheumatic mitral (valve) prolapse: Secondary | ICD-10-CM

## 2024-01-13 DIAGNOSIS — Z794 Long term (current) use of insulin: Secondary | ICD-10-CM

## 2024-01-13 LAB — CBC WITH DIFFERENTIAL/PLATELET
Absolute Lymphocytes: 4550 {cells}/uL — ABNORMAL HIGH (ref 850–3900)
Absolute Monocytes: 1090 {cells}/uL — ABNORMAL HIGH (ref 200–950)
Basophils Absolute: 87 {cells}/uL (ref 0–200)
Basophils Relative: 0.5 %
Eosinophils Absolute: 381 {cells}/uL (ref 15–500)
Eosinophils Relative: 2.2 %
HCT: 40.2 % (ref 35.0–45.0)
Hemoglobin: 13.3 g/dL (ref 11.7–15.5)
MCH: 27.9 pg (ref 27.0–33.0)
MCHC: 33.1 g/dL (ref 32.0–36.0)
MCV: 84.3 fL (ref 80.0–100.0)
MPV: 10.8 fL (ref 7.5–12.5)
Monocytes Relative: 6.3 %
Neutro Abs: 11193 {cells}/uL — ABNORMAL HIGH (ref 1500–7800)
Neutrophils Relative %: 64.7 %
Platelets: 399 10*3/uL (ref 140–400)
RBC: 4.77 10*6/uL (ref 3.80–5.10)
RDW: 13 % (ref 11.0–15.0)
Total Lymphocyte: 26.3 %
WBC: 17.3 10*3/uL — ABNORMAL HIGH (ref 3.8–10.8)

## 2024-01-13 LAB — LIPID PANEL
Cholesterol: 249 mg/dL — ABNORMAL HIGH (ref <200)
HDL: 37 mg/dL — ABNORMAL LOW (ref >=50)
Non-HDL Cholesterol (Calc): 212 mg/dL — ABNORMAL HIGH (ref <130)
Total CHOL/HDL Ratio: 6.7 (calc) — ABNORMAL HIGH (ref <5.0)
Triglycerides: 452 mg/dL — ABNORMAL HIGH (ref <150)

## 2024-01-13 LAB — COMPREHENSIVE METABOLIC PANEL WITH GFR
AG Ratio: 1.3 (calc) (ref 1.0–2.5)
ALT: 13 U/L (ref 6–29)
AST: 13 U/L (ref 10–35)
Albumin: 4 g/dL (ref 3.6–5.1)
Alkaline phosphatase (APISO): 80 U/L (ref 37–153)
BUN/Creatinine Ratio: 12 (calc) (ref 6–22)
BUN: 19 mg/dL (ref 7–25)
CO2: 25 mmol/L (ref 20–32)
Calcium: 9.8 mg/dL (ref 8.6–10.4)
Chloride: 103 mmol/L (ref 98–110)
Creat: 1.56 mg/dL — ABNORMAL HIGH (ref 0.50–1.03)
Globulin: 3 g/dL (ref 1.9–3.7)
Glucose, Bld: 159 mg/dL — ABNORMAL HIGH (ref 65–99)
Potassium: 5.2 mmol/L (ref 3.5–5.3)
Sodium: 138 mmol/L (ref 135–146)
Total Bilirubin: 0.3 mg/dL (ref 0.2–1.2)
Total Protein: 7 g/dL (ref 6.1–8.1)
eGFR: 38 mL/min/{1.73_m2} — ABNORMAL LOW (ref >=60)

## 2024-01-13 LAB — HEMOGLOBIN A1C
Hgb A1c MFr Bld: 8.5 %{Hb} — ABNORMAL HIGH (ref <5.7)
Mean Plasma Glucose: 197 mg/dL
eAG (mmol/L): 10.9 mmol/L

## 2024-01-13 LAB — LDL CHOLESTEROL, DIRECT: Direct LDL: 143 mg/dL — ABNORMAL HIGH (ref <100)

## 2024-01-13 LAB — TEST AUTHORIZATION

## 2024-01-13 MED ORDER — LOSARTAN POTASSIUM 25 MG PO TABS: 25.0000 mg | ORAL_TABLET | Freq: Every day | ORAL | 0 refills | Status: DC

## 2024-01-13 MED ORDER — ROSUVASTATIN CALCIUM 5 MG PO TABS: 5.0000 mg | ORAL_TABLET | Freq: Every day | ORAL | 0 refills | Status: DC

## 2024-01-13 MED ORDER — DAPAGLIFLOZIN PROPANEDIOL 10 MG PO TABS: 10.0000 mg | ORAL_TABLET | Freq: Every day | ORAL | 0 refills | Status: AC

## 2024-01-14 ENCOUNTER — Ambulatory Visit: Admitting: Family Medicine

## 2024-01-14 ENCOUNTER — Ambulatory Visit: Attending: Family Medicine

## 2024-01-14 ENCOUNTER — Encounter: Payer: Self-pay | Admitting: Family Medicine

## 2024-01-14 VITALS — BP 122/70 | HR 91 | Resp 16 | Ht 68.0 in | Wt 167.0 lb

## 2024-01-14 DIAGNOSIS — R55 Syncope and collapse: Secondary | ICD-10-CM | POA: Diagnosis not present

## 2024-01-14 DIAGNOSIS — R269 Unspecified abnormalities of gait and mobility: Secondary | ICD-10-CM | POA: Insufficient documentation

## 2024-01-14 DIAGNOSIS — K219 Gastro-esophageal reflux disease without esophagitis: Secondary | ICD-10-CM | POA: Diagnosis not present

## 2024-01-14 DIAGNOSIS — Z794 Long term (current) use of insulin: Secondary | ICD-10-CM

## 2024-01-14 DIAGNOSIS — N1832 Chronic kidney disease, stage 3b: Secondary | ICD-10-CM | POA: Diagnosis not present

## 2024-01-14 DIAGNOSIS — E1165 Type 2 diabetes mellitus with hyperglycemia: Secondary | ICD-10-CM

## 2024-01-14 MED ORDER — PANTOPRAZOLE SODIUM 40 MG PO TBEC: 40.0000 mg | DELAYED_RELEASE_TABLET | Freq: Every day | ORAL | 3 refills | Status: AC

## 2024-01-14 MED ORDER — GLIPIZIDE 5 MG PO TABS
2.5000 mg | ORAL_TABLET | Freq: Every day | ORAL | 1 refills | Status: AC
Start: 2024-01-14 — End: ?

## 2024-01-14 NOTE — Progress Notes (Signed)
 Patient ID: Jackie Miles, female    DOB: July 08, 1966, 58 y.o.   MRN: [id number]  PCP: Adeline Hone, PA-C  Chief Complaint  Patient presents with   Hyperglycemia    Subjective:   Jackie Miles is a 58 y.o. female, presents to clinic with CC of the following:  HPI   Sugars 170-180's Lab Results  Component Value Date   HGBA1C 8.5 (H) 01/09/2024  Renal function poor, new to est care and hasn't been on meds for a while/or records not available yet Reported glipizide  but dose unknown Recommended farxiga  and has to avoid metformin  for now due to eGFR  Lab Results  Component Value Date   EGFR 38 (L) 01/09/2024   Pt with diabetic ulcer, multiple hospitalizations/infection osteomyelitis -  Refused to do DM foot exam and DM urine testing needed at last OV She did get glucometer And returns today due to high blood sugar   Episodes of near syncope- has happened over the past 3 months, caused some falls/syncope LOC Happened the last two days  Last episode she passed out was yesterday, last thing she remembered was rolling one room to the other in her [facility], she states she does black out or pass out and loose consciousness, her family member witnessed it yesterday and she was screaming and crying pt states she would seize up and can't move can't hear or see people like she's far away and can't answer and then she snaps back to it No palpitations, sweats, shakiness, CP, SOB  No incontinence with epidoses, speech sometimes affected Family members that witnessed it via text today stated episodes lasted 2 min       Patient Active Problem List   Diagnosis Date Noted   Unspecified abnormalities of gait and mobility 01/14/2024   Aortic valve regurgitation 01/13/2024   Degenerative mitral valve prolapse 01/13/2024   Aortic valve sclerosis 01/13/2024   Cerebral microvascular disease 01/13/2024   Stage 3b chronic kidney disease (HCC) 01/13/2024   Diabetes mellitus without complication  (HCC)    Non-healing ulcer of right foot (HCC) 11/20/2018   Tobacco use disorder 11/06/2018   Delusional disorder (HCC) 11/04/2018   Cellulitis 09/07/2017   HTN (hypertension) 05/28/2017   Asthma 05/28/2017   HLD (hyperlipidemia) 05/28/2017   AKI (acute kidney injury) (HCC) 05/28/2017   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 layer exposed (HCC)    Cellulitis of foot, right    Pressure injury of skin 02/14/2017   Osteomyelitis (HCC) 02/21/2016   Foot infection 01/22/2016   Sepsis (HCC) 01/12/2016      Current Outpatient Medications:    Blood Glucose Monitoring Suppl DEVI, Dispense one deviceglucometer per insurance coverage, Disp: 1 each, Rfl: 0   dapagliflozin  propanediol (FARXIGA ) 10 MG TABS tablet, Take 1 tablet (10 mg total) by mouth daily before breakfast., Disp: 90 tablet, Rfl: 0   gabapentin  (NEURONTIN ) 300 MG capsule, Take 1 capsule (300 mg total) by mouth 2 (two) times daily., Disp: 60 capsule, Rfl: 3   Glucose Blood (BLOOD GLUCOSE TEST STRIPS) STRP, Use as directed with glucometer up to TID prn for blood sugar monitoring.  May substitute to any manufacturer covered by patient's insurance., Disp: 100 strip, Rfl: 3   Lancets Misc. MISC, Use as directed with glucometer up to TID prn for blood sugar monitoring.  May substitute to any manufacturer covered by patient's insurance., Disp: 100 each, Rfl: 3   losartan  (COZAAR ) 25 MG tablet, Take 1 tablet (25 mg total) by  mouth daily., Disp: 90 tablet, Rfl: 0   rosuvastatin  (CRESTOR ) 5 MG tablet, Take 1 tablet (5 mg total) by mouth daily., Disp: 90 tablet, Rfl: 0   Allergies  Allergen Reactions   Ibuprofen  Other (See Comments)    Reaction:  Acid reflux, GI bleeding     Social History   Tobacco Use   Smoking status: Every Day    Current packs/day: 0.25    Types: Cigarettes   Smokeless tobacco: Never  Vaping Use   Vaping status: Never Used  Substance Use Topics   Alcohol use: No   Drug use: No      Chart Review Today: I  personally reviewed active problem list, medication list, allergies, family history, social history, health maintenance, notes from last encounter, lab results, imaging with the patient/caregiver today.   Review of Systems  Constitutional: Negative.   HENT: Negative.    Eyes: Negative.   Respiratory: Negative.    Cardiovascular: Negative.   Gastrointestinal: Negative.   Endocrine: Negative.   Genitourinary: Negative.   Musculoskeletal: Negative.   Skin: Negative.   Allergic/Immunologic: Negative.   Neurological: Negative.   Hematological: Negative.   Psychiatric/Behavioral: Negative.    All other systems reviewed and are negative.      Objective:   Vitals:   01/14/24 1255  BP: 122/70  Pulse: 91  Resp: 16  SpO2: 99%  Weight: 167 lb (75.8 kg)  Height: 5\' 8"  (1.727 m)    Body mass index is 25.39 kg/m.  Physical Exam Vitals and nursing note reviewed.  Constitutional:      Appearance: She is well-developed.  HENT:     Head: Normocephalic and atraumatic.     Nose: Nose normal.  Eyes:     General:        Right eye: No discharge.        Left eye: No discharge.     Conjunctiva/sclera: Conjunctivae normal.  Neck:     Trachea: No tracheal deviation.  Cardiovascular:     Rate and Rhythm: Normal rate and regular rhythm.  Pulmonary:     Effort: Pulmonary effort is normal. No respiratory distress.     Breath sounds: No stridor.  Skin:    General: Skin is warm and dry.     Findings: No rash.  Neurological:     Mental Status: She is alert.     Motor: No abnormal muscle tone.     Coordination: Coordination normal.     Comments: In [facility]  Psychiatric:        Behavior: Behavior normal.      Results for orders placed or performed in visit on 01/09/24  HgB A1c   Collection Time: 01/09/24  3:52 PM  Result Value Ref Range   Hgb A1c MFr Bld 8.5 (H) <5.7 % of total Hgb   Mean Plasma Glucose 197 mg/dL   eAG (mmol/L) 46.9 mmol/L  Comprehensive Metabolic Panel (CMET)    Collection Time: 01/09/24  3:52 PM  Result Value Ref Range   Glucose, Bld 159 (H) 65 - 99 mg/dL   BUN 19 7 - 25 mg/dL   Creat 6.29 (H) 5.28 - 1.03 mg/dL   eGFR 38 (L) > OR = 60 mL/min/1.28m2   BUN/Creatinine Ratio 12 6 - 22 (calc)   Sodium 138 135 - 146 mmol/L   Potassium 5.2 3.5 - 5.3 mmol/L   Chloride 103 98 - 110 mmol/L   CO2 25 20 - 32 mmol/L   Calcium  9.8 8.6 - 10.4  mg/dL   Total Protein 7.0 6.1 - 8.1 g/dL   Albumin 4.0 3.6 - 5.1 g/dL   Globulin 3.0 1.9 - 3.7 g/dL (calc)   AG Ratio 1.3 1.0 - 2.5 (calc)   Total Bilirubin 0.3 0.2 - 1.2 mg/dL   Alkaline phosphatase (APISO) 80 37 - 153 U/L   AST 13 10 - 35 U/L   ALT 13 6 - 29 U/L  Lipid Profile   Collection Time: 01/09/24  3:52 PM  Result Value Ref Range   Cholesterol 249 (H) <200 mg/dL   HDL 37 (L) > OR = 50 mg/dL   Triglycerides 161 (H) <150 mg/dL   LDL Cholesterol (Calc)  mg/dL (calc)   Total CHOL/HDL Ratio 6.7 (H) <5.0 (calc)   Non-HDL Cholesterol (Calc) 212 (H) <130 mg/dL (calc)  CBC with Differential/Platelet   Collection Time: 01/09/24  3:52 PM  Result Value Ref Range   WBC 17.3 (H) 3.8 - 10.8 Thousand/uL   RBC 4.77 3.80 - 5.10 Million/uL   Hemoglobin 13.3 11.7 - 15.5 g/dL   HCT 09.6 04.5 - 40.9 %   MCV 84.3 80.0 - 100.0 fL   MCH 27.9 27.0 - 33.0 pg   MCHC 33.1 32.0 - 36.0 g/dL   RDW 81.1 91.4 - 78.2 %   Platelets 399 140 - 400 Thousand/uL   MPV 10.8 7.5 - 12.5 fL   Neutro Abs 11,193 (H) 1,500 - 7,800 cells/uL   Absolute Lymphocytes 4,550 (H) 850 - 3,900 cells/uL   Absolute Monocytes 1,090 (H) 200 - 950 cells/uL   Eosinophils Absolute 381 15 - 500 cells/uL   Basophils Absolute 87 0 - 200 cells/uL   Neutrophils Relative % 64.7 %   Total Lymphocyte 26.3 %   Monocytes Relative 6.3 %   Eosinophils Relative 2.2 %   Basophils Relative 0.5 %  LDL cholesterol, direct   Collection Time: 01/09/24  3:52 PM  Result Value Ref Range   Direct LDL 143 (H) <100 mg/dL  TEST AUTHORIZATION   Collection Time: 01/09/24   3:52 PM  Result Value Ref Range   TEST NAME: DIRECT LDL    TEST CODE: [id number]    CLIENT CONTACT: LESLIE SMITH    REPORT ALWAYS MESSAGE SIGNATURE         Assessment & Plan:     ICD-10-CM   1. Stage 3b chronic kidney disease (HCC)  N18.32    eGFR 38 with last labs, will monitor, est with nephro if not improving    2. Type 2 diabetes mellitus with hyperglycemia, with long-term current use of insulin  (HCC)  E11.65 glipiZIDE  (GLUCOTROL ) 5 MG tablet   Z79.4 Microalbumin / creatinine urine ratio   uncontrolled, start glipizide  and monitor sugars    3. Near syncope  R55 LONG TERM MONITOR (3-14 DAYS)   episodes of not being responsive? blacking out? heart monitor, VS recommended with future episodes pt urged to seek eval at time of sx    4. Gastroesophageal reflux disease, unspecified whether esophagitis present  K21.9 pantoprazole  (PROTONIX ) 40 MG tablet   on protonix  20 mg not helping, discussed 40 mg BID x 1-2 months then decreasing to once daily, f/up GI          Adeline Hone, PA-C 01/14/24 1:18 PM

## 2024-01-16 ENCOUNTER — Ambulatory Visit: Admitting: Family Medicine

## 2024-01-27 ENCOUNTER — Ambulatory Visit (INDEPENDENT_AMBULATORY_CARE_PROVIDER_SITE_OTHER): Admitting: Family Medicine

## 2024-01-27 ENCOUNTER — Encounter: Payer: Self-pay | Admitting: Family Medicine

## 2024-01-27 VITALS — BP 122/70 | HR 71 | Temp 98.1°F | Resp 16 | Ht 68.0 in | Wt 167.0 lb

## 2024-01-27 DIAGNOSIS — K5900 Constipation, unspecified: Secondary | ICD-10-CM | POA: Diagnosis not present

## 2024-01-27 DIAGNOSIS — R1012 Left upper quadrant pain: Secondary | ICD-10-CM | POA: Diagnosis not present

## 2024-01-27 DIAGNOSIS — Z7984 Long term (current) use of oral hypoglycemic drugs: Secondary | ICD-10-CM

## 2024-01-27 DIAGNOSIS — E1165 Type 2 diabetes mellitus with hyperglycemia: Secondary | ICD-10-CM | POA: Diagnosis not present

## 2024-01-27 DIAGNOSIS — N1832 Chronic kidney disease, stage 3b: Secondary | ICD-10-CM

## 2024-01-27 DIAGNOSIS — Z1159 Encounter for screening for other viral diseases: Secondary | ICD-10-CM

## 2024-01-27 DIAGNOSIS — K219 Gastro-esophageal reflux disease without esophagitis: Secondary | ICD-10-CM

## 2024-01-27 DIAGNOSIS — D72829 Elevated white blood cell count, unspecified: Secondary | ICD-10-CM

## 2024-01-27 MED ORDER — POLYETHYLENE GLYCOL 3350 17 GM/SCOOP PO POWD: 17.0000 g | Freq: Every day | ORAL | 2 refills | Status: DC | PRN

## 2024-01-27 NOTE — Progress Notes (Signed)
 Patient ID: Jackie Miles, female    DOB: 09/30/1966, 58 y.o.   MRN: [id number]  PCP: Adeline Hone, PA-C  Chief Complaint  Patient presents with   Abdominal Pain    X3 months. LUQ    Subjective:   Jackie Miles is a 58 y.o. female, presents to clinic with CC of the following:  Abdominal Pain The current episode started more than 1 month ago. The problem occurs constantly. The problem has been unchanged. The pain is located in the LUQ. The pain is severe. Quality: "like someone is pinching and twisting her skin there" The abdominal pain does not radiate. Associated symptoms include constipation and nausea. Pertinent negatives include no anorexia, arthralgias, belching, diarrhea, dysuria, fever, flatus, frequency, hematochezia, hematuria, melena, myalgias, vomiting or weight loss. Nothing aggravates the pain. The pain is relieved by Nothing. Treatments tried: buyin oxy. Prior workup: none - never presented for eval. Her past medical history is significant for abdominal surgery (cholecystecomy) and GERD (treated with ppi no improvement). There is no history of irritable bowel syndrome, pancreatitis, PUD or ulcerative colitis.    Here for abd pain located to LUQ onset 3 months ago Pt recent OV PPI med dose was increased to 40 mg BID No noticeable change to pain   Patient Active Problem List   Diagnosis Date Noted   Unspecified abnormalities of gait and mobility 01/14/2024   Aortic valve regurgitation 01/13/2024   Degenerative mitral valve prolapse 01/13/2024   Aortic valve sclerosis 01/13/2024   Cerebral microvascular disease 01/13/2024   Stage 3b chronic kidney disease (HCC) 01/13/2024   Diabetes mellitus without complication (HCC)    Non-healing ulcer of right foot (HCC) 11/20/2018   Tobacco use disorder 11/06/2018   Delusional disorder (HCC) 11/04/2018   Cellulitis 09/07/2017   HTN (hypertension) 05/28/2017   Asthma 05/28/2017   HLD (hyperlipidemia) 05/28/2017   AKI (acute  kidney injury) (HCC) 05/28/2017   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 layer exposed (HCC)    Cellulitis of foot, right    Pressure injury of skin 02/14/2017   Osteomyelitis (HCC) 02/21/2016   Foot infection 01/22/2016   Sepsis (HCC) 01/12/2016      Current Outpatient Medications:    Blood Glucose Monitoring Suppl DEVI, Dispense one deviceglucometer per insurance coverage, Disp: 1 each, Rfl: 0   dapagliflozin  propanediol (FARXIGA ) 10 MG TABS tablet, Take 1 tablet (10 mg total) by mouth daily before breakfast., Disp: 90 tablet, Rfl: 0   gabapentin  (NEURONTIN ) 300 MG capsule, Take 1 capsule (300 mg total) by mouth 2 (two) times daily., Disp: 60 capsule, Rfl: 3   glipiZIDE  (GLUCOTROL ) 5 MG tablet, Take 0.5-1 tablets (2.5-5 mg total) by mouth daily with breakfast. Hold if blood sugar is less than 100, or hold if not eating in the am, Disp: 90 tablet, Rfl: 1   Glucose Blood (BLOOD GLUCOSE TEST STRIPS) STRP, Use as directed with glucometer up to TID prn for blood sugar monitoring.  May substitute to any manufacturer covered by patient's insurance., Disp: 100 strip, Rfl: 3   Lancets Misc. MISC, Use as directed with glucometer up to TID prn for blood sugar monitoring.  May substitute to any manufacturer covered by patient's insurance., Disp: 100 each, Rfl: 3   losartan  (COZAAR ) 25 MG tablet, Take 1 tablet (25 mg total) by mouth daily., Disp: 90 tablet, Rfl: 0   pantoprazole  (PROTONIX ) 40 MG tablet, Take 1 tablet (40 mg total) by mouth daily., Disp: 30 tablet, Rfl: 3  rosuvastatin  (CRESTOR ) 5 MG tablet, Take 1 tablet (5 mg total) by mouth daily., Disp: 90 tablet, Rfl: 0   Allergies  Allergen Reactions   Ibuprofen  Other (See Comments)    Reaction:  Acid reflux, GI bleeding     Social History   Tobacco Use   Smoking status: Every Day    Current packs/day: 0.25    Types: Cigarettes   Smokeless tobacco: Never  Vaping Use   Vaping status: Never Used  Substance Use Topics   Alcohol use: No    Drug use: No      Chart Review Today: I personally reviewed active problem list, medication list, allergies, family history, social history, health maintenance, notes from last encounter, lab results, imaging with the patient/caregiver today.   Review of Systems  Constitutional: Negative.  Negative for fever and weight loss.  HENT: Negative.    Eyes: Negative.   Respiratory: Negative.    Cardiovascular: Negative.   Gastrointestinal:  Positive for abdominal pain, constipation and nausea. Negative for anorexia, diarrhea, flatus, hematochezia, melena and vomiting.  Endocrine: Negative.   Genitourinary: Negative.  Negative for dysuria, frequency and hematuria.  Musculoskeletal: Negative.  Negative for arthralgias and myalgias.  Skin: Negative.   Allergic/Immunologic: Negative.   Neurological: Negative.   Hematological: Negative.   Psychiatric/Behavioral: Negative.    All other systems reviewed and are negative.      Objective:   Vitals:   01/27/24 1448  BP: 122/70  Pulse: 71  Resp: 16  Temp: 98.1 F (36.7 C)  TempSrc: Oral  SpO2: 99%  Weight: 167 lb (75.8 kg)  Height: 5\' 8"  (1.727 m)    Body mass index is 25.39 kg/m.  Physical Exam Vitals and nursing note reviewed.  Constitutional:      Appearance: Normal appearance. She is well-developed.  HENT:     Head: Normocephalic and atraumatic.     Right Ear: External ear normal.     Left Ear: External ear normal.     Nose: Nose normal.  Eyes:     General: No scleral icterus.       Right eye: No discharge.        Left eye: No discharge.     Conjunctiva/sclera: Conjunctivae normal.  Neck:     Trachea: No tracheal deviation.  Cardiovascular:     Rate and Rhythm: Normal rate and regular rhythm.     Pulses: Normal pulses.     Heart sounds: Normal heart sounds.  Pulmonary:     Effort: Pulmonary effort is normal. No respiratory distress.     Breath sounds: No stridor.  Abdominal:     General: Bowel sounds are  normal.     Palpations: Abdomen is soft. There is no mass or pulsatile mass.     Tenderness: There is abdominal tenderness in the left upper quadrant. There is no right CVA tenderness, left CVA tenderness, guarding or rebound.     Hernia: No hernia is present.  Skin:    General: Skin is warm and dry.     Findings: No rash.  Neurological:     Mental Status: She is alert.     Motor: No abnormal muscle tone.     Coordination: Coordination normal.  Psychiatric:        Behavior: Behavior normal.      Results for orders placed or performed in visit on 01/09/24  HgB A1c   Collection Time: 01/09/24  3:52 PM  Result Value Ref Range   Hgb A1c MFr  Bld 8.5 (H) <5.7 % of total Hgb   Mean Plasma Glucose 197 mg/dL   eAG (mmol/L) 09.8 mmol/L  Comprehensive Metabolic Panel (CMET)   Collection Time: 01/09/24  3:52 PM  Result Value Ref Range   Glucose, Bld 159 (H) 65 - 99 mg/dL   BUN 19 7 - 25 mg/dL   Creat 1.19 (H) 1.47 - 1.03 mg/dL   eGFR 38 (L) > OR = 60 mL/min/1.41m2   BUN/Creatinine Ratio 12 6 - 22 (calc)   Sodium 138 135 - 146 mmol/L   Potassium 5.2 3.5 - 5.3 mmol/L   Chloride 103 98 - 110 mmol/L   CO2 25 20 - 32 mmol/L   Calcium  9.8 8.6 - 10.4 mg/dL   Total Protein 7.0 6.1 - 8.1 g/dL   Albumin 4.0 3.6 - 5.1 g/dL   Globulin 3.0 1.9 - 3.7 g/dL (calc)   AG Ratio 1.3 1.0 - 2.5 (calc)   Total Bilirubin 0.3 0.2 - 1.2 mg/dL   Alkaline phosphatase (APISO) 80 37 - 153 U/L   AST 13 10 - 35 U/L   ALT 13 6 - 29 U/L  Lipid Profile   Collection Time: 01/09/24  3:52 PM  Result Value Ref Range   Cholesterol 249 (H) <200 mg/dL   HDL 37 (L) > OR = 50 mg/dL   Triglycerides 829 (H) <150 mg/dL   LDL Cholesterol (Calc)  mg/dL (calc)   Total CHOL/HDL Ratio 6.7 (H) <5.0 (calc)   Non-HDL Cholesterol (Calc) 212 (H) <130 mg/dL (calc)  CBC with Differential/Platelet   Collection Time: 01/09/24  3:52 PM  Result Value Ref Range   WBC 17.3 (H) 3.8 - 10.8 Thousand/uL   RBC 4.77 3.80 - 5.10 Million/uL    Hemoglobin 13.3 11.7 - 15.5 g/dL   HCT 56.2 13.0 - 86.5 %   MCV 84.3 80.0 - 100.0 fL   MCH 27.9 27.0 - 33.0 pg   MCHC 33.1 32.0 - 36.0 g/dL   RDW 78.4 69.6 - 29.5 %   Platelets 399 140 - 400 Thousand/uL   MPV 10.8 7.5 - 12.5 fL   Neutro Abs 11,193 (H) 1,500 - 7,800 cells/uL   Absolute Lymphocytes 4,550 (H) 850 - 3,900 cells/uL   Absolute Monocytes 1,090 (H) 200 - 950 cells/uL   Eosinophils Absolute 381 15 - 500 cells/uL   Basophils Absolute 87 0 - 200 cells/uL   Neutrophils Relative % 64.7 %   Total Lymphocyte 26.3 %   Monocytes Relative 6.3 %   Eosinophils Relative 2.2 %   Basophils Relative 0.5 %  LDL cholesterol, direct   Collection Time: 01/09/24  3:52 PM  Result Value Ref Range   Direct LDL 143 (H) <100 mg/dL  TEST AUTHORIZATION   Collection Time: 01/09/24  3:52 PM  Result Value Ref Range   TEST NAME: DIRECT LDL    TEST CODE: [id number]    CLIENT CONTACT: LESLIE SMITH    REPORT ALWAYS MESSAGE SIGNATURE         Assessment & Plan:   1. Left upper quadrant abdominal pain (Primary) Constant pain w/o radiation x 3+ months No hx helpful, she does not know anything that makes it better or worse, no radiation, not changed with eating, no N,V, D she reports no bowel changes then states she doesn't have BM for 5+ days and has to make herself go. Recent labs showed leukocytosis, no anemia, AST/ALT normal Will do labs for pancreas today and try to get imaging, ttp to LUQ w/o  peritoneal signs May need GI f/up  Hx of GERD and possibly GI bleed? Tx constipation - see below Gastroparesis? CT imaging ordered - CT ABDOMEN PELVIS WO CONTRAST; Future - CBC with Differential/Platelet - Lipase - Amylase - Comprehensive metabolic panel with GFR  2. Constipation, unspecified constipation type She states chronic her whole life Trial of miralax or can do OTC stool softeners, avoid stimulant laxatives, can also try magnesium  supplements - polyethylene glycol powder (GLYCOLAX/MIRALAX) 17  GM/SCOOP powder; Take 17 g by mouth daily as needed for moderate constipation or severe constipation.  Dispense: 578 g; Refill: 2  3. Uncontrolled type 2 diabetes mellitus with hyperglycemia (HCC) Uncontrolled Started glipizide , no lows, sugars mostly in the 100's She didn't get farxiga  - called pharmacy to f/up on that today Needs due to renal function and limited other oral meds Lab Results  Component Value Date   HGBA1C 8.5 (H) 01/09/2024  - Microalbumin / creatinine urine ratio - Comprehensive metabolic panel with GFR   4. Stage 3b chronic kidney disease (HCC) Lab Results  Component Value Date   EGFR 38 (L) 01/09/2024  Limited DM med choices - start farxiga  - Microalbumin / creatinine urine ratio - Comprehensive metabolic panel with GFR   5. Encounter for hepatitis C screening test for low risk patient - Hepatitis C antibody  6. Leukocytosis, unspecified type Recheck CBBC - pt newer to me, difficult historian, suspected leukocytosis to be from foot/toe wound, which pt is cryptic about  - CT ABDOMEN PELVIS WO CONTRAST; Future - CBC with Differential/Platelet  Keep last scheduled routine OV f/up for DM     Adeline Hone, PA-C 01/27/24 3:04 PM

## 2024-01-27 NOTE — Patient Instructions (Signed)
 Central scheduling number - Please call to follow up on your imaging if you have not heard in a few days [phone]

## 2024-01-28 LAB — CBC WITH DIFFERENTIAL/PLATELET
Absolute Lymphocytes: 3658 {cells}/uL (ref 850–3900)
Absolute Monocytes: 816 {cells}/uL (ref 200–950)
Basophils Absolute: 54 {cells}/uL (ref 0–200)
Basophils Relative: 0.4 %
Eosinophils Absolute: 462 {cells}/uL (ref 15–500)
Eosinophils Relative: 3.4 %
HCT: 37.7 % (ref 35.0–45.0)
Hemoglobin: 12.4 g/dL (ref 11.7–15.5)
MCH: 27.9 pg (ref 27.0–33.0)
MCHC: 32.9 g/dL (ref 32.0–36.0)
MCV: 84.7 fL (ref 80.0–100.0)
MPV: 10.6 fL (ref 7.5–12.5)
Monocytes Relative: 6 %
Neutro Abs: 8609 {cells}/uL — ABNORMAL HIGH (ref 1500–7800)
Neutrophils Relative %: 63.3 %
Platelets: 334 10*3/uL (ref 140–400)
RBC: 4.45 10*6/uL (ref 3.80–5.10)
RDW: 13.2 % (ref 11.0–15.0)
Total Lymphocyte: 26.9 %
WBC: 13.6 10*3/uL — ABNORMAL HIGH (ref 3.8–10.8)

## 2024-01-28 LAB — COMPREHENSIVE METABOLIC PANEL WITH GFR
AG Ratio: 1.5 (calc) (ref 1.0–2.5)
ALT: 13 U/L (ref 6–29)
AST: 15 U/L (ref 10–35)
Albumin: 4.2 g/dL (ref 3.6–5.1)
Alkaline phosphatase (APISO): 79 U/L (ref 37–153)
BUN/Creatinine Ratio: 16 (calc) (ref 6–22)
BUN: 21 mg/dL (ref 7–25)
CO2: 28 mmol/L (ref 20–32)
Calcium: 9.7 mg/dL (ref 8.6–10.4)
Chloride: 103 mmol/L (ref 98–110)
Creat: 1.32 mg/dL — ABNORMAL HIGH (ref 0.50–1.03)
Globulin: 2.8 g/dL (ref 1.9–3.7)
Glucose, Bld: 184 mg/dL — ABNORMAL HIGH (ref 65–99)
Potassium: 5 mmol/L (ref 3.5–5.3)
Sodium: 138 mmol/L (ref 135–146)
Total Bilirubin: 0.3 mg/dL (ref 0.2–1.2)
Total Protein: 7 g/dL (ref 6.1–8.1)
eGFR: 47 mL/min/{1.73_m2} — ABNORMAL LOW (ref >=60)

## 2024-01-28 LAB — AMYLASE: Amylase: 22 U/L (ref 21–101)

## 2024-01-28 LAB — MICROALBUMIN / CREATININE URINE RATIO
Creatinine, Urine: 78 mg/dL (ref 20–275)
Microalb Creat Ratio: 774 mg/g{creat} — ABNORMAL HIGH (ref <30)
Microalb, Ur: 60.4 mg/dL

## 2024-01-28 LAB — HEPATITIS C ANTIBODY: Hepatitis C Ab: NONREACTIVE

## 2024-01-28 LAB — LIPASE: Lipase: 68 U/L — ABNORMAL HIGH (ref 7–60)

## 2024-01-30 ENCOUNTER — Ambulatory Visit
Admission: RE | Admit: 2024-01-30 | Discharge: 2024-01-30 | Disposition: A | Discharge status: Home / Self Care | Source: Ambulatory Visit | Attending: Family Medicine

## 2024-01-30 ENCOUNTER — Ambulatory Visit: Admitting: Family Medicine

## 2024-01-30 DIAGNOSIS — R1012 Left upper quadrant pain: Secondary | ICD-10-CM | POA: Insufficient documentation

## 2024-01-30 DIAGNOSIS — D72829 Elevated white blood cell count, unspecified: Secondary | ICD-10-CM | POA: Insufficient documentation

## 2024-01-30 DIAGNOSIS — K5900 Constipation, unspecified: Secondary | ICD-10-CM | POA: Insufficient documentation

## 2024-02-03 NOTE — Addendum Note (Signed)
 Addended by: Sidra Oldfield on: 02/03/2024 12:02 PM   Modules accepted: Orders

## 2024-02-09 ENCOUNTER — Ambulatory Visit: Payer: Self-pay

## 2024-02-09 ENCOUNTER — Emergency Department

## 2024-02-09 ENCOUNTER — Emergency Department
Admission: EM | Admit: 2024-02-09 | Discharge: 2024-02-09 | Disposition: A | Discharge status: Home / Self Care | Attending: Emergency Medicine | Admitting: Emergency Medicine

## 2024-02-09 ENCOUNTER — Encounter: Payer: Self-pay | Admitting: Emergency Medicine

## 2024-02-09 ENCOUNTER — Other Ambulatory Visit: Payer: Self-pay

## 2024-02-09 DIAGNOSIS — W01198A Fall on same level from slipping, tripping and stumbling with subsequent striking against other object, initial encounter: Secondary | ICD-10-CM | POA: Diagnosis not present

## 2024-02-09 DIAGNOSIS — S0990XA Unspecified injury of head, initial encounter: Secondary | ICD-10-CM | POA: Diagnosis present

## 2024-02-09 DIAGNOSIS — E119 Type 2 diabetes mellitus without complications: Secondary | ICD-10-CM | POA: Insufficient documentation

## 2024-02-09 DIAGNOSIS — S0003XA Contusion of scalp, initial encounter: Secondary | ICD-10-CM | POA: Insufficient documentation

## 2024-02-09 DIAGNOSIS — I10 Essential (primary) hypertension: Secondary | ICD-10-CM | POA: Diagnosis not present

## 2024-02-09 DIAGNOSIS — M25511 Pain in right shoulder: Secondary | ICD-10-CM | POA: Diagnosis not present

## 2024-02-09 DIAGNOSIS — W19XXXA Unspecified fall, initial encounter: Secondary | ICD-10-CM

## 2024-02-09 LAB — CBC
HCT: 36.1 % (ref 36.0–46.0)
Hemoglobin: 12 g/dL (ref 12.0–15.0)
MCH: 28.2 pg (ref 26.0–34.0)
MCHC: 33.2 g/dL (ref 30.0–36.0)
MCV: 84.9 fL (ref 80.0–100.0)
Platelets: 320 10*3/uL (ref 150–400)
RBC: 4.25 MIL/uL (ref 3.87–5.11)
RDW: 14.1 % (ref 11.5–15.5)
WBC: 13.8 10*3/uL — ABNORMAL HIGH (ref 4.0–10.5)
nRBC: 0 % (ref 0.0–0.2)

## 2024-02-09 LAB — COMPREHENSIVE METABOLIC PANEL WITH GFR
ALT: 12 U/L (ref 0–44)
AST: 14 U/L — ABNORMAL LOW (ref 15–41)
Albumin: 3.5 g/dL (ref 3.5–5.0)
Alkaline Phosphatase: 67 U/L (ref 38–126)
Anion gap: 10 (ref 5–15)
BUN: 13 mg/dL (ref 6–20)
CO2: 28 mmol/L (ref 22–32)
Calcium: 9.4 mg/dL (ref 8.9–10.3)
Chloride: 100 mmol/L (ref 98–111)
Creatinine, Ser: 1.33 mg/dL — ABNORMAL HIGH (ref 0.44–1.00)
GFR, Estimated: 46 mL/min — ABNORMAL LOW (ref >60)
Glucose, Bld: 202 mg/dL — ABNORMAL HIGH (ref 70–99)
Potassium: 4.2 mmol/L (ref 3.5–5.1)
Sodium: 138 mmol/L (ref 135–145)
Total Bilirubin: 0.4 mg/dL (ref 0.0–1.2)
Total Protein: 7 g/dL (ref 6.5–8.1)

## 2024-02-09 MED ORDER — ONDANSETRON 4 MG PO TBDP
4.0000 mg | ORAL_TABLET | Freq: Once | ORAL | Status: AC
  Administered 2024-02-09: 4 mg via ORAL
  Filled 2024-02-09: qty 1

## 2024-02-09 MED ORDER — OXYCODONE-ACETAMINOPHEN 5-325 MG PO TABS
1.0000 | ORAL_TABLET | Freq: Once | ORAL | Status: AC
  Administered 2024-02-09: 1 via ORAL
  Filled 2024-02-09: qty 1

## 2024-02-09 NOTE — Discharge Instructions (Signed)
 You can take ibuprofen  or Tylenol  as needed for pain symptoms.  Please follow-up with your primary care provider for ongoing outpatient care.

## 2024-02-09 NOTE — ED Provider Notes (Signed)
 [facility] Provider Note    Event Date/Time   First MD Initiated Contact with Patient 02/09/24 1809     (approximate)   History   Fall   HPI Jackie Miles is a 58 y.o. female with history of DM2, HTN, HLD, GERD presenting today for fall.  Patient said she stood up from her wheelchair quickly and got lightheaded.  This caused her to fall and hit her head.  She is unsure if she lost consciousness.  Also noting right shoulder pain.  Otherwise denies neck pain, other extremity pain either upper or lower outside of the shoulder, abdominal pain, chest pain.  She is not on any blood thinners.  Patient states having episodes of lightheadedness only when she stands.  No episodes outside of this.  Also already been evaluated by her primary care provider in the past couple months for similar symptoms.  Denies any other associate symptoms such as chest pain, palpitations, shortness of breath, nausea, vomiting.     Physical Exam   Triage Vital Signs: ED Triage Vitals  Encounter Vitals Group     BP 02/09/24 1700 (!) 115/59     Systolic BP Percentile --      Diastolic BP Percentile --      Pulse Rate 02/09/24 1700 99     Resp 02/09/24 1700 17     Temp 02/09/24 1700 98.3 F (36.8 C)     Temp Source 02/09/24 1700 Oral     SpO2 02/09/24 1700 99 %     Weight 02/09/24 1701 158 lb (71.7 kg)     Height 02/09/24 1701 5\' 8"  (1.727 m)     Head Circumference --      Peak Flow --      Pain Score 02/09/24 1700 7     Pain Loc --      Pain Education --      Exclude from Growth Chart --     Most recent vital signs: Vitals:   02/09/24 1700  BP: (!) 115/59  Pulse: 99  Resp: 17  Temp: 98.3 F (36.8 C)  SpO2: 99%   I have reviewed the vital signs. General:  Awake, alert, no acute distress. Head:  Normocephalic, hematoma to frontal scalp and bruising around the left eyebrow. EENT:  PERRL, EOMI, Oral mucosa pink and moist, Neck is supple.  No tenderness to  C-spine. Cardiovascular: Regular rate, 2+ distal pulses. Respiratory:  Normal respiratory effort, symmetrical expansion, no distress.   Extremities:  Moving all four extremities through full ROM without pain.  Mild tenderness palpation over right anterior shoulder.  Otherwise nontender throughout bilateral upper and lower extremities.  No T or L-spine tenderness palpation. Neuro:  Alert and oriented.  Interacting appropriately.   Skin:  Warm, dry, no rash.   Psych: Appropriate affect.    ED Results / Procedures / Treatments   Labs (all labs ordered are listed, but only abnormal results are displayed) Labs Reviewed  COMPREHENSIVE METABOLIC PANEL WITH GFR - Abnormal; Notable for the following components:      Result Value   Glucose, Bld 202 (*)    Creatinine, Ser 1.33 (*)    AST 14 (*)    GFR, Estimated 46 (*)    All other components within normal limits  CBC - Abnormal; Notable for the following components:   WBC 13.8 (*)    All other components within normal limits  CBG MONITORING, ED     EKG My EKG interpretation:  Rate of 101, normal sinus rhythm, right axis deviation.  No acute ST elevations or depressions   RADIOLOGY Independent interpreted CT head and C-spine with no acute pathology.  Independently interpreted x-ray right shoulder with no acute pathology   PROCEDURES:  Critical Care performed: No  Procedures   MEDICATIONS ORDERED IN ED: Medications  oxyCODONE -acetaminophen  (PERCOCET/ROXICET) 5-325 MG per tablet 1 tablet (1 tablet Oral Given 02/09/24 1850)  ondansetron  (ZOFRAN -ODT) disintegrating tablet 4 mg (4 mg Oral Given 02/09/24 1850)     IMPRESSION / MDM / ASSESSMENT AND PLAN / ED COURSE  I reviewed the triage vital signs and the nursing notes.                              Differential diagnosis includes, but is not limited to, ICH, cervical spine injury, distal clavicle or proximal humerus fracture, soft tissue injury and hematomas  Patient's  presentation is most consistent with acute complicated illness / injury requiring diagnostic workup.  Patient is a 58 year old female presenting today for fall with head injury.  Hematoma to the left frontal scalp as well as pain over her right shoulder.  Will get CT imaging of head and C-spine as well as x-ray of right shoulder.  Vital signs stable laboratory workup otherwise reassuring consistent with her baseline.  Episode happened immediately upon standing with some lightheadedness most consistent with orthostatic hypotension.  She has also been already evaluated by her primary care provider for similar episodes with long-term Holter monitor.  Patient given Percocet and Zofran  for symptoms.  CT imaging of head and C-spine and x-ray of right shoulder showed no acute pathology.  Patient reassessed with no ongoing pain symptoms and otherwise asymptomatic.  Will discharge at this time with PCP follow-up and given strict return precautions.  The patient is on the cardiac monitor to evaluate for evidence of arrhythmia and/or significant heart rate changes. Clinical Course as of 02/09/24 1953  Mon Feb 09, 2024  1816 Creatinine(!): 1.33 Consistent with baseline [DW]  1816 WBC(!): 13.8 Consistent with chronic elevation baseline [DW]    Clinical Course User Index [DW] Kandee Orion, MD     FINAL CLINICAL IMPRESSION(S) / ED DIAGNOSES   Final diagnoses:  Fall, initial encounter  Hematoma of scalp, initial encounter     Rx / DC Orders   ED Discharge Orders     None        Note:  This document was prepared using Dragon voice recognition software and may include unintentional dictation errors.   Kandee Orion, MD 02/09/24 [id number]

## 2024-02-09 NOTE — ED Triage Notes (Signed)
 Patient to ED via [facility] from home after a fall. Pt reports getting dizzy and falling. Denies LOC or blood thinners. States she did hit her head. C/o of pain on entire left side.

## 2024-02-09 NOTE — ED Notes (Signed)
 First Nurse Note: Pt to ED via [facility] from home for dizziness and fall. Pt did hit her head and has a knot on her head, pt did not have LOC. Pt has hx/o DM. Pt is not on blood thinners.

## 2024-02-09 NOTE — Telephone Encounter (Signed)
  Chief Complaint: fall Symptoms: fell, lump on head and arm, dizzy Frequency: X1 Pertinent Negatives: Patient denies unable to obtain all info Disposition: [x] ED /[] Urgent Care (no appt availability in office) / [] Appointment(In office/virtual)/ []  [facility]/ [] Home Care/ [] Refused Recommended Disposition /[] [facility]/ []  Follow-up with PCP Additional Notes:  Sister Jackie Miles (not on DPR) calling. Jackie Miles out of wheelchair, she has "knot of forehead and arm'. She is also feeling dizzy. She told her sister she is refusing ER and wants to see [name] tomorrow. Triage stopped at this time and advised emergency room evaluation for continued dizziness and weakness post fall. Sister Jackie Miles is calling 911.   Copied from CRM [id number]. Topic: Clinical - Red Word Triage >> Feb 09, 2024  3:28 PM [name] wrote: Red Word that prompted transfer to Nurse Triage: Fall. Patient's sister is calling in because patient fell out of her wheelchair and has a knot on both her head and arm. Patient doesn't want to go to the hospital. Reason for Disposition  [1] SEVERE weakness (i.e., unable to walk or barely able to walk, requires support) AND [2] new-onset or worsening  SEVERE dizziness (e.g., unable to stand, requires support to walk, feels like passing out now)  Protocols used: Falls and Falling-A-AH, Dizziness - Lightheadedness-A-AH

## 2024-02-09 NOTE — Telephone Encounter (Signed)
 Pt daughter notified, she needed to go to ER for knot on head and dizziness.  Informed if she was schedule Leisa would most likely send to ER.  If medically emergency will need to be evaluated urgently instead of waiting

## 2024-02-12 NOTE — Telephone Encounter (Signed)
 Copied from CRM [id number]. Topic: Complaint (DO NOT CONVERT) - Provider (non-sensitive) >> Feb 12, 2024 12:45 PM Lysbeth Sauger wrote: Date of Incident: 02/12/2024 Details of complaint: still having dizzy spells How would the patient like to see it resolved? Notifying dr, want dr to reach out to her On a scale of 1-10, how was your experience?  What would it take to bring it to a 10?   Route to Research officer, political party.

## 2024-02-12 NOTE — Telephone Encounter (Signed)
 Called daughter and made aware. She stated this morning she did not mention any dizziness, but would talk with her more about it. She wants to wait to schedule any appointments at this time.

## 2024-03-02 ENCOUNTER — Inpatient Hospital Stay: Admitting: Family Medicine

## 2024-03-08 ENCOUNTER — Ambulatory Visit: Admitting: Family Medicine

## 2024-03-09 ENCOUNTER — Ambulatory Visit (INDEPENDENT_AMBULATORY_CARE_PROVIDER_SITE_OTHER): Admitting: Internal Medicine

## 2024-03-09 ENCOUNTER — Other Ambulatory Visit: Payer: Self-pay

## 2024-03-09 ENCOUNTER — Encounter: Payer: Self-pay | Admitting: Internal Medicine

## 2024-03-09 VITALS — BP 118/82 | HR 98 | Temp 98.0°F | Resp 16 | Ht 68.0 in | Wt 167.9 lb

## 2024-03-09 DIAGNOSIS — R1012 Left upper quadrant pain: Secondary | ICD-10-CM | POA: Diagnosis not present

## 2024-03-09 DIAGNOSIS — K5900 Constipation, unspecified: Secondary | ICD-10-CM

## 2024-03-09 LAB — COMPREHENSIVE METABOLIC PANEL WITH GFR
AG Ratio: 1.3 (calc) (ref 1.0–2.5)
ALT: 10 U/L (ref 6–29)
AST: 12 U/L (ref 10–35)
Albumin: 3.8 g/dL (ref 3.6–5.1)
Alkaline phosphatase (APISO): 77 U/L (ref 37–153)
BUN/Creatinine Ratio: 13 (calc) (ref 6–22)
BUN: 21 mg/dL (ref 7–25)
CO2: 27 mmol/L (ref 20–32)
Calcium: 9.4 mg/dL (ref 8.6–10.4)
Chloride: 100 mmol/L (ref 98–110)
Creat: 1.58 mg/dL — ABNORMAL HIGH (ref 0.50–1.03)
Globulin: 2.9 g/dL (ref 1.9–3.7)
Glucose, Bld: 261 mg/dL — ABNORMAL HIGH (ref 65–99)
Potassium: 4.5 mmol/L (ref 3.5–5.3)
Sodium: 134 mmol/L — ABNORMAL LOW (ref 135–146)
Total Bilirubin: 0.3 mg/dL (ref 0.2–1.2)
Total Protein: 6.7 g/dL (ref 6.1–8.1)
eGFR: 37 mL/min/{1.73_m2} — ABNORMAL LOW (ref >=60)

## 2024-03-09 LAB — CBC WITH DIFFERENTIAL/PLATELET
Absolute Lymphocytes: 3947 {cells}/uL — ABNORMAL HIGH (ref 850–3900)
Absolute Monocytes: 842 {cells}/uL (ref 200–950)
Basophils Absolute: 55 {cells}/uL (ref 0–200)
Basophils Relative: 0.4 %
Eosinophils Absolute: 345 {cells}/uL (ref 15–500)
Eosinophils Relative: 2.5 %
HCT: 36 % (ref 35.0–45.0)
Hemoglobin: 11.7 g/dL (ref 11.7–15.5)
MCH: 28.2 pg (ref 27.0–33.0)
MCHC: 32.5 g/dL (ref 32.0–36.0)
MCV: 86.7 fL (ref 80.0–100.0)
MPV: 10.4 fL (ref 7.5–12.5)
Monocytes Relative: 6.1 %
Neutro Abs: 8611 {cells}/uL — ABNORMAL HIGH (ref 1500–7800)
Neutrophils Relative %: 62.4 %
Platelets: 327 10*3/uL (ref 140–400)
RBC: 4.15 10*6/uL (ref 3.80–5.10)
RDW: 13.9 % (ref 11.0–15.0)
Total Lymphocyte: 28.6 %
WBC: 13.8 10*3/uL — ABNORMAL HIGH (ref 3.8–10.8)

## 2024-03-09 LAB — LIPASE: Lipase: 76 U/L — ABNORMAL HIGH (ref 7–60)

## 2024-03-09 MED ORDER — POLYETHYLENE GLYCOL 3350 17 GM/SCOOP PO POWD: 17.0000 g | Freq: Every day | ORAL | 2 refills | Status: AC | PRN

## 2024-03-09 NOTE — Progress Notes (Signed)
 Acute Office Visit  Subjective:     Patient ID: Jackie Miles, female    DOB: [birth year], 58 y.o.   MRN: [id number]  Chief Complaint  Patient presents with   Abdominal Pain    Still hurting on left upper quadrant     Abdominal Pain Associated symptoms include constipation. Pertinent negatives include no diarrhea, dysuria, fever, frequency, hematuria, nausea or vomiting.   Patient is in today for abdominal pain.   Discussed the use of AI scribe software for clinical note transcription with the patient, who gave verbal consent to proceed.  History of Present Illness Jackie Miles is a 58 year old female with chronic kidney disease who presents with left upper quadrant abdominal pain.  She has experienced intermittent left upper quadrant abdominal pain for three months, sometimes feeling constant, without specific triggers. The pain resolves spontaneously or with pain medications. A recent CT scan without contrast showed no pancreatic masses or biliary obstruction. Lipase levels were slightly elevated at 68, and her white blood cell count was elevated. She denies radiation of pain to her back, shoulder, or groin.  She underwent gallbladder removal and had a kidney infection four months ago, treated with antibiotics. She denies current urinary symptoms such as bleeding, urgency, or incontinence.  She takes medications for acid reflux and constipation but has not received Farxiga  due to insurance issues. She is on losartan  for hypertension and takes metformin  and glipizide  for diabetes, though metformin  is not listed in her current medication list. She takes 800 mg of metformin  irregularly and is unsure if it is extended-release. She also takes gabapentin  but has not had a recent refill.  She has diabetes with a recent A1c of 8.5. She reports poor appetite, significant weight loss, and irregular bowel movements, often requiring medication. She has not had a bowel movement since Friday or  Saturday, which was medication-induced. She is not currently using MiraLAX .    Review of Systems  Constitutional:  Negative for fever.  Gastrointestinal:  Positive for abdominal pain and constipation. Negative for diarrhea, heartburn, nausea and vomiting.  Genitourinary:  Negative for dysuria, frequency, hematuria and urgency.        Objective:    BP 118/82 (Cuff Size: Large)   Pulse 98   Temp 98 F (36.7 C) (Oral)   Resp 16   Ht 5\' 8"  (1.727 m)   Wt 167 lb 14.4 oz (76.2 kg)   SpO2 99%   BMI 25.53 kg/m    Physical Exam Constitutional:      Appearance: Normal appearance.  HENT:     Head: Normocephalic and atraumatic.  Eyes:     Conjunctiva/sclera: Conjunctivae normal.  Cardiovascular:     Rate and Rhythm: Normal rate and regular rhythm.  Pulmonary:     Effort: Pulmonary effort is normal.     Breath sounds: Normal breath sounds.  Abdominal:     General: Bowel sounds are normal. There is no distension.     Palpations: Abdomen is soft. There is no mass.     Tenderness: There is abdominal tenderness. There is no guarding or rebound.     Hernia: No hernia is present.     Comments: LUQ ptp  Skin:    General: Skin is warm and dry.  Neurological:     General: No focal deficit present.     Mental Status: She is alert. Mental status is at baseline.  Psychiatric:        Mood and Affect: Mood normal.  Behavior: Behavior normal.     No results found for any visits on 03/09/24.      Assessment & Plan:   Assessment and Plan Assessment & Plan Abdominal Pain Chronic left upper quadrant pain, intermittent, possibly related to pancreas. CT without contrast negative. Lipase slightly elevated. Metformin  may contribute to symptoms. Awaiting GI consultation before CT with contrast due to CKD risk. - Refer to GI specialist. - Repeat lipase levels. - Continue acid reflux medication. - Prescribe MiraLAX  for constipation.  Diabetes Mellitus w/Hyperglycemia Type 2  diabetes with A1c of 8.5. Current treatment includes metformin  and glipizide . Metformin  may cause GI symptoms. Discussed extended release formulation. - Verify metformin  formulation, consider extended release. - Continue glipizide . - Monitor blood glucose.  - Comprehensive Metabolic Panel (CMET) - Lipase - CBC w/Diff/Platelet - polyethylene glycol powder (GLYCOLAX /MIRALAX ) 17 GM/SCOOP powder; Take 17 g by mouth daily as needed for moderate constipation or severe constipation.  Dispense: 578 g; Refill: 2   Return if symptoms worsen or fail to improve.  Rockney Cid, DO

## 2024-03-12 ENCOUNTER — Ambulatory Visit: Payer: Self-pay | Admitting: Internal Medicine

## 2024-03-26 NOTE — Telephone Encounter (Signed)
 Copied from CRM [id number]. Topic: Clinical - Medication Refill >> Mar 26, 2024  1:57 PM Tiffini S wrote: Medication: gabapentin  (NEURONTIN ) 300 MG capsule  Has the patient contacted their pharmacy? Yes (Agent: If no, request that the patient contact the pharmacy for the refill. If patient does not wish to contact the pharmacy document the reason why and proceed with request.) (Agent: If yes, when and what did the pharmacy advise?)  This is the patient's preferred pharmacy:  [id number] - [place], [place] - [address] [place] [address] Phone: [phone] Fax: [phone]  Is this the correct pharmacy for this prescription? Yes If no, delete pharmacy and type the correct one.   Has the prescription been filled recently? Yes  Is the patient out of the medication? Yes, patient have one tablet left   Has the patient been seen for an appointment in the last year OR does the patient have an upcoming appointment? Yes  Can we respond through MyChart? Yes  Agent: Please be advised that Rx refills may take up to 3 business days. We ask that you follow-up with your pharmacy.

## 2024-04-12 ENCOUNTER — Other Ambulatory Visit: Payer: Self-pay | Admitting: Family Medicine

## 2024-04-12 DIAGNOSIS — I6789 Other cerebrovascular disease: Secondary | ICD-10-CM

## 2024-04-12 DIAGNOSIS — E1165 Type 2 diabetes mellitus with hyperglycemia: Secondary | ICD-10-CM

## 2024-04-12 DIAGNOSIS — N1832 Chronic kidney disease, stage 3b: Secondary | ICD-10-CM

## 2024-04-12 DIAGNOSIS — I10 Essential (primary) hypertension: Secondary | ICD-10-CM

## 2024-04-12 DIAGNOSIS — E785 Hyperlipidemia, unspecified: Secondary | ICD-10-CM

## 2024-04-13 ENCOUNTER — Telehealth: Payer: Self-pay | Admitting: Family Medicine

## 2024-04-13 NOTE — Telephone Encounter (Unsigned)
 Copied from CRM [id number]. Topic: Clinical - Medical Advice >> Apr 13, 2024 10:28 AM Donee H wrote: Reason for CRM:  Patient's sister Olam Coventry called with concern of patient. She is stating she thinks patient need a home health nurse or be placed in nursing home. Olam states she is unable to continue to lift her and that patient is not really eating now. She would like to speak with doctor about concerns and what to further do to get help. Callback [phone]

## 2024-04-13 NOTE — Telephone Encounter (Signed)
 Called and informed Olam that we are unable to speak to her about Lyfe due to her not being on the pts DPR. Olam understood. Ivyrose was available to speak to and understands we need a new DPR with her sister Starla name on it and that she needs to schedule an appt . Pt will call back to do so

## 2024-04-13 NOTE — Telephone Encounter (Signed)
 Please schedule her with leisa will need face to face visit per ins guidelines

## 2024-04-13 NOTE — Telephone Encounter (Signed)
 Requested Prescriptions  Pending Prescriptions Disp Refills   rosuvastatin  (CRESTOR ) 5 MG tablet [Pharmacy Med Name: ROSUVASTATIN  CALCIUM  5 MG TAB] 90 tablet 0    Sig: TAKE 1 TABLET (5 MG TOTAL) BY MOUTH DAILY.     Cardiovascular:  Antilipid - Statins 2 Failed - 04/13/2024  2:30 PM      Failed - Cr in normal range and within 360 days    Creat  Date Value Ref Range Status  03/09/2024 1.58 (H) 0.50 - 1.03 mg/dL Final   Creatinine, Urine  Date Value Ref Range Status  01/27/2024 78 20 - 275 mg/dL Final         Failed - Lipid Panel in normal range within the last 12 months    Cholesterol  Date Value Ref Range Status  01/09/2024 249 (H) <200 mg/dL Final   LDL Cholesterol (Calc)  Date Value Ref Range Status  01/09/2024  mg/dL (calc) Final    Comment:    . LDL cholesterol not calculated. Triglyceride levels greater than 400 mg/dL invalidate calculated LDL results. . Reference range: <100 . Desirable range <100 mg/dL for primary prevention;   <70 mg/dL for patients with CHD or diabetic patients  with > or = 2 CHD risk factors. SABRA LDL-C is now calculated using the Martin-Hopkins  calculation, which is a validated novel method providing  better accuracy than the Friedewald equation in the  estimation of LDL-C.  Gladis APPLETHWAITE et al. SANDREA. 7986;689(80): 2061-2068  (http://education.[employer].com/faq/FAQ164)    Direct LDL  Date Value Ref Range Status  01/09/2024 143 (H) <100 mg/dL Final    Comment:    . Desirable range <100 mg/dL for primary prevention;   <70 mg/dL for patients with CHD or diabetic patients  with > or = 2 CHD risk factors. SABRA    HDL  Date Value Ref Range Status  01/09/2024 37 (L) > OR = 50 mg/dL Final   Triglycerides  Date Value Ref Range Status  01/09/2024 452 (H) <150 mg/dL Final    Comment:    . If a non-fasting specimen was collected, consider repeat triglyceride testing on a fasting specimen if clinically indicated.  Veatrice et al. J. of  Clin. Lipidol. 2015;9:129-169. SABRA          Passed - Patient is not pregnant      Passed - Valid encounter within last 12 months    Recent Outpatient Visits           1 month ago Left upper quadrant abdominal pain   [facility] Bernardo Fend, DO   2 months ago Left upper quadrant abdominal pain   [facility] Leavy Mole, PA-C   3 months ago Stage 3b chronic kidney disease [facility])    [facility] Leavy Mole, PA-C   3 months ago Encounter for medical examination to establish care   [facility], Leisa, PA-C               losartan  (COZAAR ) 25 MG tablet [Pharmacy Med Name: LOSARTAN  POTASSIUM 25 MG TAB] 90 tablet 0    Sig: TAKE 1 TABLET (25 MG TOTAL) BY MOUTH DAILY.     Cardiovascular:  Angiotensin Receptor Blockers Failed - 04/13/2024  2:30 PM      Failed - Cr in normal range and within 180 days    Creat  Date Value Ref Range Status  03/09/2024 1.58 (H) 0.50 - 1.03 mg/dL Final   Creatinine,  Urine  Date Value Ref Range Status  01/27/2024 78 20 - 275 mg/dL Final         Passed - K in normal range and within 180 days    Potassium  Date Value Ref Range Status  03/09/2024 4.5 3.5 - 5.3 mmol/L Final         Passed - Patient is not pregnant      Passed - Last BP in normal range    BP Readings from Last 1 Encounters:  03/09/24 118/82         Passed - Valid encounter within last 6 months    Recent Outpatient Visits           1 month ago Left upper quadrant abdominal pain   [facility] Bernardo Fend, DO   2 months ago Left upper quadrant abdominal pain   [facility] Leavy Mole, PA-C   3 months ago Stage 3b chronic kidney disease [facility])   [facility] Leavy Mole, PA-C   3 months ago Encounter for medical examination to establish care   [facility] Leavy Mole, PA-C

## 2024-04-15 ENCOUNTER — Ambulatory Visit: Payer: Self-pay

## 2024-04-15 NOTE — Telephone Encounter (Signed)
 FYI Only or Action Required?: FYI only for provider.  Patient was last seen in primary care on 03/09/2024 by Bernardo Fend, DO.  Called Nurse Triage reporting Dizziness.  Symptoms began 4 months ago, ongoing problem.  Interventions attempted: Other: Seen in ED on 7/13.  Symptoms are: unchanged.  Triage Disposition: See PCP When Office is Open (Within 3 Days)  Patient/caregiver understands and will follow disposition?: Yes     Copied from CRM [id number]. Topic: Clinical - Red Word Triage >> Apr 15, 2024 11:02 AM Marissa P wrote: Red Word that prompted transfer to Nurse Triage: Patient is having dizzy spells was just seen in hospital Sunday evening and it hasn't gotten better. Would like to be seen asap.      Reason for Disposition  [1] MODERATE dizziness (e.g., interferes with normal activities) AND [2] has been evaluated by doctor (or NP/PA) for this  Answer Assessment - Initial Assessment Questions 1. DESCRIPTION: Describe your dizziness.     Dizzy, woozy  2. LIGHTHEADED: Do you feel lightheaded? (e.g., somewhat faint, woozy, weak upon standing)     Yes 3. VERTIGO: Do you feel like either you or the room is spinning or tilting? (i.e., vertigo)     No 4. SEVERITY: How bad is it?  Do you feel like you are going to faint? Can you stand and walk?     Moderate to severe when present  5. ONSET:  When did the dizziness begin?     4 months ago, intermittent  6. AGGRAVATING FACTORS: Does anything make it worse? (e.g., standing, change in head position)     Standing exacerbates dizziness  8. CAUSE: What do you think is causing the dizziness? (e.g., decreased fluids or food, diarrhea, emotional distress, heat exposure, new medicine, sudden standing, vomiting; unknown)     Unsure  9. RECURRENT SYMPTOM: Have you had dizziness before? If Yes, ask: When was the last time? What happened that time?     Yes  Protocols used: Dizziness - Lightheadedness-A-AH

## 2024-04-16 ENCOUNTER — Encounter: Payer: Self-pay | Admitting: Family Medicine

## 2024-04-16 ENCOUNTER — Ambulatory Visit (INDEPENDENT_AMBULATORY_CARE_PROVIDER_SITE_OTHER): Admitting: Family Medicine

## 2024-04-16 VITALS — BP 124/80 | HR 95 | Resp 16 | Ht 68.0 in | Wt 162.0 lb

## 2024-04-16 DIAGNOSIS — R42 Dizziness and giddiness: Secondary | ICD-10-CM

## 2024-04-16 DIAGNOSIS — E1169 Type 2 diabetes mellitus with other specified complication: Secondary | ICD-10-CM

## 2024-04-16 DIAGNOSIS — D72829 Elevated white blood cell count, unspecified: Secondary | ICD-10-CM

## 2024-04-16 DIAGNOSIS — Z09 Encounter for follow-up examination after completed treatment for conditions other than malignant neoplasm: Secondary | ICD-10-CM

## 2024-04-16 DIAGNOSIS — Z91199 Patient's noncompliance with other medical treatment and regimen due to unspecified reason: Secondary | ICD-10-CM

## 2024-04-16 DIAGNOSIS — E1165 Type 2 diabetes mellitus with hyperglycemia: Secondary | ICD-10-CM

## 2024-04-16 DIAGNOSIS — K5900 Constipation, unspecified: Secondary | ICD-10-CM

## 2024-04-16 DIAGNOSIS — R109 Unspecified abdominal pain: Secondary | ICD-10-CM

## 2024-04-16 DIAGNOSIS — M21961 Unspecified acquired deformity of right lower leg: Secondary | ICD-10-CM | POA: Insufficient documentation

## 2024-04-16 DIAGNOSIS — G629 Polyneuropathy, unspecified: Secondary | ICD-10-CM | POA: Diagnosis not present

## 2024-04-16 DIAGNOSIS — N1832 Chronic kidney disease, stage 3b: Secondary | ICD-10-CM

## 2024-04-16 DIAGNOSIS — Z89421 Acquired absence of other right toe(s): Secondary | ICD-10-CM

## 2024-04-16 DIAGNOSIS — M86671 Other chronic osteomyelitis, right ankle and foot: Secondary | ICD-10-CM | POA: Insufficient documentation

## 2024-04-16 DIAGNOSIS — R55 Syncope and collapse: Secondary | ICD-10-CM

## 2024-04-16 MED ORDER — GABAPENTIN 300 MG PO CAPS: 300.0000 mg | ORAL_CAPSULE | Freq: Two times a day (BID) | ORAL | 1 refills | Status: AC

## 2024-04-16 MED ORDER — BLOOD GLUCOSE TEST VI STRP
ORAL_STRIP | 3 refills | Status: AC
Start: 2024-04-16 — End: ?

## 2024-04-16 NOTE — Progress Notes (Signed)
 Patient ID: Jackie Miles, female    DOB: 1965-11-07, 58 y.o.   MRN: [id number]  PCP: Leavy Mole, PA-C  Chief Complaint  Patient presents with   Hospitalization Follow-up   Dizziness    Not drinking water/eating good. Only drinking Pepsi. Not taking meds/checking glucose.    Subjective:   Jackie Miles is a 58 y.o. female, presents to clinic with CC of the following:  HPI  HFU, uncontrolled DM with med non compliance, she had prior extensive work up and then missed GI appt and now waiting until Oct for new pt appt She has episodes of near syncope - went to [facility] ED but left AMA  Not compliant with meds, drinking soda and no water Brought here by family member today  Lab Results  Component Value Date   HGBA1C 8.5 (H) 01/09/2024    Wt Readings from Last 5 Encounters:  04/16/24 162 lb (73.5 kg)  03/09/24 167 lb 14.4 oz (76.2 kg)  02/09/24 158 lb (71.7 kg)  01/27/24 167 lb (75.8 kg)  01/14/24 167 lb (75.8 kg)   BMI Readings from Last 5 Encounters:  04/16/24 24.63 kg/m  03/09/24 25.53 kg/m  02/09/24 24.02 kg/m  01/27/24 25.39 kg/m  01/14/24 25.39 kg/m   [facility] ED visit and lab results reviewed, left AMA so work up was incomplete  Discussed the use of AI scribe software for clinical note transcription with the patient, who gave verbal consent to proceed.  History of Present Illness Jackie Miles is a 58 year old female with chronic kidney disease and diabetes who presents for ED f/up  Episodes of limb weakness and altered responsiveness - Episodes of arm and leg weakness with inability to respond - Recent emergency room visits for these episodes - Incomplete workup due to long wait times for CT and MRI - Heart monitor test results pending as device was not returned  Glycemic control and laboratory abnormalities - Diabetes mellitus with recent laboratory findings of elevated blood glucose, poor diet   Chronic kidney disease and fluid management - Chronic  kidney disease - Inquires about the impact of fluid intake on kidney function  Abdominal pain and gastrointestinal symptoms - Abdominal pain continuous - Mildly elevated lipase levels - Previous imaging showed no significant pancreatic abnormalities  Appetite and nutritional intake - Lack of appetite with minimal oral intake - minimal fluid intake  Chronic diabetic foot wound/amputation/fx deformity - Chronic foot wound present for over eight years related to diabetes - Seeks assistance with wound care management - No current blisters on feet     Patient Active Problem List   Diagnosis Date Noted   Unspecified abnormalities of gait and mobility 01/14/2024   Aortic valve regurgitation 01/13/2024   Degenerative mitral valve prolapse 01/13/2024   Aortic valve sclerosis 01/13/2024   Cerebral microvascular disease 01/13/2024   Stage 3b chronic kidney disease (HCC) 01/13/2024   Diabetes mellitus without complication (HCC)    Non-healing ulcer of right foot (HCC) 11/20/2018   Tobacco use disorder 11/06/2018   Delusional disorder (HCC) 11/04/2018   Cellulitis 09/07/2017   HTN (hypertension) 05/28/2017   Asthma 05/28/2017   HLD (hyperlipidemia) 05/28/2017   AKI (acute kidney injury) (HCC) 05/28/2017   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 layer exposed (HCC)    Cellulitis of foot, right    Pressure injury of skin 02/14/2017   Osteomyelitis (HCC) 02/21/2016   Foot infection 01/22/2016   Sepsis (HCC) 01/12/2016  Current Outpatient Medications:    dapagliflozin  propanediol (FARXIGA ) 10 MG TABS tablet, Take 1 tablet (10 mg total) by mouth daily before breakfast., Disp: 90 tablet, Rfl: 0   gabapentin  (NEURONTIN ) 300 MG capsule, Take 1 capsule (300 mg total) by mouth 2 (two) times daily., Disp: 60 capsule, Rfl: 3   glipiZIDE  (GLUCOTROL ) 5 MG tablet, Take 0.5-1 tablets (2.5-5 mg total) by mouth daily with breakfast. Hold if blood sugar is less than 100, or hold if not eating in  the am, Disp: 90 tablet, Rfl: 1   losartan  (COZAAR ) 25 MG tablet, TAKE 1 TABLET (25 MG TOTAL) BY MOUTH DAILY., Disp: 90 tablet, Rfl: 0   pantoprazole  (PROTONIX ) 40 MG tablet, Take 1 tablet (40 mg total) by mouth daily., Disp: 30 tablet, Rfl: 3   polyethylene glycol powder (GLYCOLAX /MIRALAX ) 17 GM/SCOOP powder, Take 17 g by mouth daily as needed for moderate constipation or severe constipation., Disp: 578 g, Rfl: 2   rosuvastatin  (CRESTOR ) 5 MG tablet, TAKE 1 TABLET (5 MG TOTAL) BY MOUTH DAILY., Disp: 90 tablet, Rfl: 0   Allergies  Allergen Reactions   Ibuprofen  Other (See Comments)    Reaction:  Acid reflux, GI bleeding     Social History   Tobacco Use   Smoking status: Every Day    Current packs/day: 0.25    Types: Cigarettes   Smokeless tobacco: Never  Vaping Use   Vaping status: Never Used  Substance Use Topics   Alcohol use: No   Drug use: No      Chart Review Today: I personally reviewed active problem list, medication list, allergies, family history, social history, health maintenance, notes from last encounter, lab results, imaging with the patient/caregiver today.   Review of Systems  Constitutional: Negative.   HENT: Negative.    Eyes: Negative.   Respiratory: Negative.    Cardiovascular: Negative.   Gastrointestinal: Negative.   Endocrine: Negative.   Genitourinary: Negative.   Musculoskeletal: Negative.   Skin: Negative.   Allergic/Immunologic: Negative.   Neurological: Negative.   Hematological: Negative.   Psychiatric/Behavioral: Negative.    All other systems reviewed and are negative.      Objective:   Vitals:   04/16/24 1008  BP: 124/80  Pulse: 95  Resp: 16  SpO2: 96%  Weight: 162 lb (73.5 kg)  Height: 5' 8 (1.727 m)    Body mass index is 24.63 kg/m.  Physical Exam Vitals and nursing note reviewed.  Constitutional:      General: She is not in acute distress.    Appearance: Normal appearance. She is well-developed. She is not  ill-appearing, toxic-appearing or diaphoretic.  HENT:     Head: Normocephalic and atraumatic.     Right Ear: External ear normal.     Left Ear: External ear normal.     Nose: Nose normal.  Eyes:     General: No scleral icterus.       Right eye: No discharge.        Left eye: No discharge.     Conjunctiva/sclera: Conjunctivae normal.  Neck:     Trachea: No tracheal deviation.  Cardiovascular:     Rate and Rhythm: Normal rate and regular rhythm.     Pulses: Normal pulses.     Heart sounds: Normal heart sounds.  Pulmonary:     Effort: Pulmonary effort is normal. No respiratory distress.     Breath sounds: Normal breath sounds. No stridor.  Musculoskeletal:     Comments: Right ankle and foot  deformity, in boot and ace wrap, she allowed examination today, see photos  Skin:    General: Skin is warm.     Findings: No erythema or rash.  Neurological:     Mental Status: She is alert. Mental status is at baseline.     Motor: No abnormal muscle tone.     Coordination: Coordination normal.     Comments: In WC throughout exam and in boot to RLE  Psychiatric:        Mood and Affect: Mood normal.        Behavior: Behavior normal.         Results for orders placed or performed in visit on 03/09/24  Comprehensive Metabolic Panel (CMET)   Collection Time: 03/09/24  2:51 PM  Result Value Ref Range   Glucose, Bld 261 (H) 65 - 99 mg/dL   BUN 21 7 - 25 mg/dL   Creat 8.41 (H) 9.49 - 1.03 mg/dL   eGFR 37 (L) > OR = 60 mL/min/1.63m2   BUN/Creatinine Ratio 13 6 - 22 (calc)   Sodium 134 (L) 135 - 146 mmol/L   Potassium 4.5 3.5 - 5.3 mmol/L   Chloride 100 98 - 110 mmol/L   CO2 27 20 - 32 mmol/L   Calcium  9.4 8.6 - 10.4 mg/dL   Total Protein 6.7 6.1 - 8.1 g/dL   Albumin 3.8 3.6 - 5.1 g/dL   Globulin 2.9 1.9 - 3.7 g/dL (calc)   AG Ratio 1.3 1.0 - 2.5 (calc)   Total Bilirubin 0.3 0.2 - 1.2 mg/dL   Alkaline phosphatase (APISO) 77 37 - 153 U/L   AST 12 10 - 35 U/L   ALT 10 6 - 29 U/L   Lipase   Collection Time: 03/09/24  2:51 PM  Result Value Ref Range   Lipase 76 (H) 7 - 60 U/L  CBC w/Diff/Platelet   Collection Time: 03/09/24  2:51 PM  Result Value Ref Range   WBC 13.8 (H) 3.8 - 10.8 Thousand/uL   RBC 4.15 3.80 - 5.10 Million/uL   Hemoglobin 11.7 11.7 - 15.5 g/dL   HCT 63.9 64.9 - 54.9 %   MCV 86.7 80.0 - 100.0 fL   MCH 28.2 27.0 - 33.0 pg   MCHC 32.5 32.0 - 36.0 g/dL   RDW 86.0 88.9 - 84.9 %   Platelets 327 140 - 400 Thousand/uL   MPV 10.4 7.5 - 12.5 fL   Neutro Abs 8,611 (H) 1,500 - 7,800 cells/uL   Absolute Lymphocytes 3,947 (H) 850 - 3,900 cells/uL   Absolute Monocytes 842 200 - 950 cells/uL   Eosinophils Absolute 345 15 - 500 cells/uL   Basophils Absolute 55 0 - 200 cells/uL   Neutrophils Relative % 62.4 %   Total Lymphocyte 28.6 %   Monocytes Relative 6.1 %   Eosinophils Relative 2.5 %   Basophils Relative 0.4 %       Assessment & Plan:   1. Hospital discharge follow-up [facility] ED VISIT 7/13: HPI: 58 y.o. 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 who presents with increased episodes of transient dizziness that come and go but been occurring since he suffered a fall back in May. Episodes are described as a sensation of  rocking on a boat, but are not accompanied with any ear ringing, loss of hearing. Patient is accompanied by her sons who state that her dizziness goes away when she has a cigarette. She also started noticing episodes of inability to use her right arm as the right arm loses sensation and ability to move that comes back within several minutes. Episodes happen  sporadically throughout the day or do not follow a discrete pattern. Patient states that since she fell several months ago she has not been the same. EMS brought her into the emergency departments, completing a stroke screen which is negative. She denies any fevers, chills, chest pains, shortness of breath, nausea, vomiting or diarrhea. She denies any abdominal pain.  DDx/MDM: Patient presenting with transient episodes of dizziness suggestive of benign positional vertigo less likely to be Mnire's disease, vestibular neuritis or have a component of central vertigo. Neurological examination at the bedside reveals no acute neurological findings all cranial nerves intact, and sensation and motor intact of the face, trunk, abdomen, upper and lower extremities. Review of this patient's chart reveals that she has noncompliance with medication that is well-known as well as type 2 diabetes, fibromyalgia. Symptoms of transient right arm weakness may be secondary to neuropathy or radiculopathy after fall with trauma to the neck. Not currently having any symptoms at this time.  Diagnostic workup as below. Will treat patient with basic metabolic workup and CT head.. Patient declines the latter and opts to go home.   - Comprehensive metabolic panel with GFR - CBC with Differential/Platelet - Urine Culture - Microalbumin / creatinine urine ratio - DRUG MONITOR, PANEL 5, SCREEN, URINE - Hemoglobin A1c  2. Dizziness Unchanged for several months Consider imaging - past CT head neg May 2025: IMPRESSION: CT of the head: Chronic atrophic and ischemic changes without acute abnormality. - Ambulatory referral to Neurology - LONG TERM MONITOR (3-14 DAYS)  3. Neuropathy  - gabapentin  (NEURONTIN ) 300 MG capsule; Take 1 capsule (300 mg total) by mouth 2 (two) times daily.  Dispense: 180 capsule; Refill: 1 - Ambulatory referral to Neurology  4. Uncontrolled type 2 diabetes mellitus with hyperglycemia (HCC) Poor  diet and med compliance Recheck labs - Comprehensive metabolic panel with GFR - Hemoglobin A1c   5. Medically noncompliant PMHx and current sx and compliance difficult to get from pt which limits ability to provider her with best medical care  Multiple past ED visits and hospitalizations related to DM and right LE/ankle/foot osteomyelitis where pt refused tx or admission and left AMA  - Comprehensive metabolic panel with GFR - CBC with Differential/Platelet - Urine Culture - Microalbumin / creatinine urine ratio - DRUG MONITOR, PANEL 5, SCREEN, URINE - Hemoglobin A1c  6. Stage 3b chronic kidney disease (HCC) Recheck BP acceptable today Needs to work on drinking some fluids during the day, limit sodas, limit caffeine - Comprehensive metabolic panel with GFR - Hemoglobin A1c  7. Constipation, unspecified constipation type Chronic, miralax  prescribed, previously advised to avoid buying and taking others narcotics as it will increase/worsen constipation Poor DM control may also be contributing with possible gastroparesis?  8. Abdominal pain, unspecified abdominal location (Primary) Ongoing for several months, work up done and pt referred to GI but missed appt, waiting for new pt app in Oct New referral to different practice  Constipation, gastroparesis, elevated lipase may be a factor, can try to get hida scan as past CT abd and RUQ US  were neg - Comprehensive metabolic panel with GFR - CBC with  Differential/Platelet - Urine Culture - Microalbumin / creatinine urine ratio - DRUG MONITOR, PANEL 5, SCREEN, URINE - NM Hepato W/Eject Fract - Ambulatory referral to Gastroenterology  9. Leukocytosis, unspecified type Recheck labs - CBC with Differential/Platelet  10. Type 2 diabetes mellitus with other specified complication, without long-term current use of insulin  (HCC) See above  - Glucose Blood (BLOOD GLUCOSE TEST STRIPS) STRP; Use as directed with glucometer up to TID prn  for blood sugar monitoring.  May substitute to any manufacturer covered by patient's insurance.  Dispense: 100 strip; Refill: 3  11. Near syncope Consult to neuro [employer] zio monitor - LONG TERM MONITOR (3-14 DAYS)   12. History of amputation of right fifth toe (HCC)  - Ambulatory referral to Podiatry  13. History of amputation of right fourth toe (HCC)  - Ambulatory referral to Podiatry  14. Acquired ankle/foot deformity, right  - Ambulatory referral to Podiatry  Return in about 3 months (around 07/17/2024) for Routine follow-up.   I added imaging for right ankle - may need MRI of LE ankle and foot and likely will need ortho and vascular -pt refused care and left AMA many times in the past regarding this from 2018 to 2021 and appears lost to f/up since then.    Michelene Cower, PA-C 04/16/24 10:26 AM

## 2024-04-17 LAB — CBC WITH DIFFERENTIAL/PLATELET
Absolute Lymphocytes: 3989 {cells}/uL — ABNORMAL HIGH (ref 850–3900)
Absolute Monocytes: 842 {cells}/uL (ref 200–950)
Basophils Absolute: 85 {cells}/uL (ref 0–200)
Basophils Relative: 0.7 %
Eosinophils Absolute: 354 {cells}/uL (ref 15–500)
Eosinophils Relative: 2.9 %
HCT: 38.3 % (ref 35.0–45.0)
Hemoglobin: 12.4 g/dL (ref 11.7–15.5)
MCH: 28.7 pg (ref 27.0–33.0)
MCHC: 32.4 g/dL (ref 32.0–36.0)
MCV: 88.7 fL (ref 80.0–100.0)
MPV: 10.4 fL (ref 7.5–12.5)
Monocytes Relative: 6.9 %
Neutro Abs: 6930 {cells}/uL (ref 1500–7800)
Neutrophils Relative %: 56.8 %
Platelets: 316 Thousand/uL (ref 140–400)
RBC: 4.32 Million/uL (ref 3.80–5.10)
RDW: 12.9 % (ref 11.0–15.0)
Total Lymphocyte: 32.7 %
WBC: 12.2 Thousand/uL — ABNORMAL HIGH (ref 3.8–10.8)

## 2024-04-17 LAB — COMPREHENSIVE METABOLIC PANEL WITH GFR
AG Ratio: 1.4 (calc) (ref 1.0–2.5)
ALT: 8 U/L (ref 6–29)
AST: 11 U/L (ref 10–35)
Albumin: 3.9 g/dL (ref 3.6–5.1)
Alkaline phosphatase (APISO): 55 U/L (ref 37–153)
BUN/Creatinine Ratio: 11 (calc) (ref 6–22)
BUN: 16 mg/dL (ref 7–25)
CO2: 28 mmol/L (ref 20–32)
Calcium: 9.6 mg/dL (ref 8.6–10.4)
Chloride: 104 mmol/L (ref 98–110)
Creat: 1.5 mg/dL — ABNORMAL HIGH (ref 0.50–1.03)
Globulin: 2.8 g/dL (ref 1.9–3.7)
Glucose, Bld: 169 mg/dL — ABNORMAL HIGH (ref 65–99)
Potassium: 4.4 mmol/L (ref 3.5–5.3)
Sodium: 140 mmol/L (ref 135–146)
Total Bilirubin: 0.3 mg/dL (ref 0.2–1.2)
Total Protein: 6.7 g/dL (ref 6.1–8.1)
eGFR: 40 mL/min/1.73m2 — ABNORMAL LOW (ref >=60)

## 2024-04-17 LAB — HEMOGLOBIN A1C
Hgb A1c MFr Bld: 8.2 % — ABNORMAL HIGH (ref <5.7)
Mean Plasma Glucose: 189 mg/dL
eAG (mmol/L): 10.4 mmol/L

## 2024-04-19 ENCOUNTER — Other Ambulatory Visit (HOSPITAL_COMMUNITY): Payer: Self-pay

## 2024-04-19 ENCOUNTER — Encounter: Payer: Self-pay | Admitting: Gastroenterology

## 2024-04-20 ENCOUNTER — Ambulatory Visit: Payer: Self-pay | Admitting: Family Medicine

## 2024-04-20 ENCOUNTER — Other Ambulatory Visit: Payer: Self-pay

## 2024-04-20 DIAGNOSIS — N1832 Chronic kidney disease, stage 3b: Secondary | ICD-10-CM

## 2024-04-23 ENCOUNTER — Ambulatory Visit
Admission: RE | Admit: 2024-04-23 | Discharge: 2024-04-23 | Disposition: A | Discharge status: Home / Self Care | Source: Ambulatory Visit | Attending: Family Medicine | Admitting: Family Medicine

## 2024-04-23 DIAGNOSIS — M86671 Other chronic osteomyelitis, right ankle and foot: Secondary | ICD-10-CM | POA: Diagnosis present

## 2024-04-23 DIAGNOSIS — E1165 Type 2 diabetes mellitus with hyperglycemia: Secondary | ICD-10-CM | POA: Insufficient documentation

## 2024-04-23 DIAGNOSIS — M21961 Unspecified acquired deformity of right lower leg: Secondary | ICD-10-CM | POA: Diagnosis present

## 2024-04-23 DIAGNOSIS — Z89421 Acquired absence of other right toe(s): Secondary | ICD-10-CM | POA: Insufficient documentation

## 2024-04-27 ENCOUNTER — Ambulatory Visit (INDEPENDENT_AMBULATORY_CARE_PROVIDER_SITE_OTHER): Payer: Self-pay | Admitting: Podiatry

## 2024-04-27 DIAGNOSIS — M14671 Charcot's joint, right ankle and foot: Secondary | ICD-10-CM

## 2024-04-27 NOTE — Progress Notes (Signed)
   No chief complaint on file.   HPI: 58 y.o. female presenting today as a new patient for evaluation and second opinion regarding her right foot and ankle pathology.  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  Below-knee amputation has been recommended in the past.  She is currently minimally ambulatory only in a cam boot  Past Medical History:  Diagnosis Date   Asthma    Cellulitis of foot, right    Diabetes mellitus without complication (HCC)    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 layer exposed (HCC)    Fibromyalgia    H/O percutaneous left heart catheterization    HLD (hyperlipidemia)    Hypertension    Osteomyelitis (HCC) 02/21/2016   Pressure injury of skin 02/14/2017   Shingles     Past Surgical History:  Procedure Laterality Date   toe amputation      Allergies  Allergen Reactions   Ibuprofen  Other (See Comments)    Reaction:  Acid reflux, GI bleeding     Physical Exam: General: The patient is alert and oriented x3 in no acute distress.  Dermatology: No open wounds noted  Vascular: Skin is warm to touch.  Chronic moderate edema noted right lower extremity.  Clinically no concern for vascular compromise  Neurological: Light touch and protective threshold absent  Musculoskeletal Exam: Deformity with complete dislocation of the foot in relationship to the leg at the level of the ankle.  Essentially minimally ambulatory in a cam boot.  History of toe amputations right 4th and 5th digits.  DG Ankle Complete Right 07/13/2019 IMPRESSION: Chronic osseous deformity about the ankle, at least partially felt to be due to Charcot arthropathy. Superimposed chronic osteomyelitis cannot be excluded. Improved soft tissue swelling without well-defined soft tissue ulcer or acute osseous destruction.  Assessment/Plan of Care: 1.  Diabetes mellitus with peripheral polyneuropathy; uncontrolled  2.  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  Imaging reviewed in detail with the patient today -Today we actually had a very good discussion regarding the patient's current situation and condition.  I expressed the recommendation for below-knee amputation and the benefits associated.  Currently she is unable to even walk due to the severe instability of the foot and ankle.  I explained that this would not be 'giving up' or 'defeat', but rather getting her life back and becoming ambulatory again.  After lengthy discussion she is amenable to exploring the option of below-knee amputation.  Also explained some of the risks associated BKA especially in the context of uncontrolled diabetes mellitus. -Referral placed for vascular, Dr. Selinda Gu [facility], for consultation and opinion regarding BKA.  Greatly appreciated -Return to clinic with me PRN     Thresa EMERSON Sar, DPM [facility]  Dr. Thresa EMERSON Sar, DPM    [address] [place], [place] [address]                Office [phone]  Fax [phone]

## 2024-04-29 ENCOUNTER — Other Ambulatory Visit (HOSPITAL_COMMUNITY): Payer: Self-pay

## 2024-04-30 ENCOUNTER — Encounter: Payer: Self-pay | Admitting: Neurology

## 2024-05-11 ENCOUNTER — Encounter (INDEPENDENT_AMBULATORY_CARE_PROVIDER_SITE_OTHER): Admitting: Vascular Surgery

## 2024-05-14 ENCOUNTER — Encounter
Admission: RE | Admit: 2024-05-14 | Discharge: 2024-05-14 | Disposition: A | Discharge status: Home / Self Care | Source: Ambulatory Visit | Attending: Family Medicine | Admitting: Family Medicine

## 2024-05-14 DIAGNOSIS — R109 Unspecified abdominal pain: Secondary | ICD-10-CM | POA: Diagnosis present

## 2024-05-14 MED ORDER — TECHNETIUM TC 99M MEBROFENIN IV KIT
5.3000 | PACK | Freq: Once | INTRAVENOUS | Status: AC | PRN
  Administered 2024-05-14: 5.3 via INTRAVENOUS

## 2024-05-14 MED ORDER — MORPHINE SULFATE (PF) 2 MG/ML IV SOLN
INTRAVENOUS | Status: AC
  Filled 2024-05-14: qty 2

## 2024-05-14 MED ORDER — MORPHINE SULFATE 2 MG/ML IJ SOLN
3.0000 mg | Freq: Once | INTRAMUSCULAR | Status: AC
  Administered 2024-05-14: 3 mg via INTRAVENOUS
  Filled 2024-05-14: qty 2

## 2024-05-19 ENCOUNTER — Other Ambulatory Visit: Payer: Self-pay | Admitting: Nephrology

## 2024-05-19 DIAGNOSIS — E1122 Type 2 diabetes mellitus with diabetic chronic kidney disease: Secondary | ICD-10-CM

## 2024-05-19 DIAGNOSIS — N1832 Chronic kidney disease, stage 3b: Secondary | ICD-10-CM

## 2024-05-25 ENCOUNTER — Ambulatory Visit

## 2024-06-10 ENCOUNTER — Ambulatory Visit: Admitting: Gastroenterology

## 2024-07-05 ENCOUNTER — Ambulatory Visit: Admitting: Neurology

## 2024-08-04 ENCOUNTER — Ambulatory Visit: Admitting: Gastroenterology

## 2024-09-20 ENCOUNTER — Ambulatory Visit: Admitting: Gastroenterology
